# Patient Record
Sex: Male | Born: 1950 | Race: White | Hispanic: No | Marital: Married | Smoking: Current every day smoker
Health system: Southern US, Community
[De-identification: ages and names within clinical notes are randomized; demographics above are authoritative.]

## PROBLEM LIST (undated history)

## (undated) DIAGNOSIS — G35 Multiple sclerosis: Secondary | ICD-10-CM

## (undated) DIAGNOSIS — G709 Myoneural disorder, unspecified: Secondary | ICD-10-CM

## (undated) DIAGNOSIS — F419 Anxiety disorder, unspecified: Secondary | ICD-10-CM

## (undated) DIAGNOSIS — D751 Secondary polycythemia: Secondary | ICD-10-CM

## (undated) DIAGNOSIS — H9190 Unspecified hearing loss, unspecified ear: Secondary | ICD-10-CM

## (undated) DIAGNOSIS — G259 Extrapyramidal and movement disorder, unspecified: Secondary | ICD-10-CM

## (undated) DIAGNOSIS — H539 Unspecified visual disturbance: Secondary | ICD-10-CM

## (undated) HISTORY — DX: Myoneural disorder, unspecified: G70.9

## (undated) HISTORY — DX: Unspecified hearing loss, unspecified ear: H91.90

## (undated) HISTORY — DX: Multiple sclerosis: G35

## (undated) HISTORY — DX: Unspecified visual disturbance: H53.9

## (undated) HISTORY — DX: Extrapyramidal and movement disorder, unspecified: G25.9

## (undated) HISTORY — DX: Anxiety disorder, unspecified: F41.9

## (undated) HISTORY — PX: BUNIONECTOMY: SHX129

## (undated) HISTORY — DX: Secondary polycythemia: D75.1

---

## 2008-04-26 ENCOUNTER — Encounter: Admission: RE | Admit: 2008-04-26 | Discharge: 2008-04-26 | Payer: Self-pay | Admitting: Family Medicine

## 2009-04-01 ENCOUNTER — Ambulatory Visit: Payer: Self-pay | Admitting: Oncology

## 2009-04-05 LAB — CBC WITH DIFFERENTIAL/PLATELET
BASO%: 0.3 % (ref 0.0–2.0)
Basophils Absolute: 0 10*3/uL (ref 0.0–0.1)
EOS%: 1 % (ref 0.0–7.0)
Eosinophils Absolute: 0.1 10*3/uL (ref 0.0–0.5)
HCT: 48.8 % (ref 38.4–49.9)
MCH: 36.8 pg — ABNORMAL HIGH (ref 27.2–33.4)
NEUT%: 66.5 % (ref 39.0–75.0)
RDW: 12.5 % (ref 11.0–14.6)
WBC: 10.4 10*3/uL — ABNORMAL HIGH (ref 4.0–10.3)

## 2009-04-10 LAB — COMPREHENSIVE METABOLIC PANEL
AST: 111 U/L — ABNORMAL HIGH (ref 0–37)
Alkaline Phosphatase: 67 U/L (ref 39–117)
Chloride: 102 mEq/L (ref 96–112)
Creatinine, Ser: 1.09 mg/dL (ref 0.40–1.50)
Glucose, Bld: 101 mg/dL — ABNORMAL HIGH (ref 70–99)
Total Bilirubin: 0.7 mg/dL (ref 0.3–1.2)

## 2009-04-10 LAB — HEMOCHROMATOSIS DNA-PCR(C282Y,H63D)

## 2009-10-03 ENCOUNTER — Encounter: Admission: RE | Admit: 2009-10-03 | Discharge: 2009-10-03 | Payer: Self-pay | Admitting: Orthopedic Surgery

## 2010-06-24 ENCOUNTER — Emergency Department (HOSPITAL_COMMUNITY)
Admission: EM | Admit: 2010-06-24 | Discharge: 2010-06-24 | Disposition: A | Discharge status: Home / Self Care | Payer: 59 | Attending: Emergency Medicine | Admitting: Emergency Medicine

## 2010-06-24 DIAGNOSIS — W1809XA Striking against other object with subsequent fall, initial encounter: Secondary | ICD-10-CM | POA: Insufficient documentation

## 2010-06-24 DIAGNOSIS — Y92009 Unspecified place in unspecified non-institutional (private) residence as the place of occurrence of the external cause: Secondary | ICD-10-CM | POA: Insufficient documentation

## 2010-06-24 DIAGNOSIS — S0180XA Unspecified open wound of other part of head, initial encounter: Secondary | ICD-10-CM | POA: Insufficient documentation

## 2011-07-22 ENCOUNTER — Ambulatory Visit: Payer: 59 | Admitting: Physical Therapy

## 2011-07-30 ENCOUNTER — Ambulatory Visit: Payer: 59 | Attending: Neurology | Admitting: Physical Therapy

## 2011-07-30 DIAGNOSIS — IMO0001 Reserved for inherently not codable concepts without codable children: Secondary | ICD-10-CM | POA: Insufficient documentation

## 2011-07-30 DIAGNOSIS — R269 Unspecified abnormalities of gait and mobility: Secondary | ICD-10-CM | POA: Insufficient documentation

## 2011-08-10 ENCOUNTER — Ambulatory Visit: Payer: 59 | Admitting: Physical Therapy

## 2011-08-12 ENCOUNTER — Ambulatory Visit: Payer: 59 | Admitting: Physical Therapy

## 2011-08-17 ENCOUNTER — Ambulatory Visit: Payer: 59 | Admitting: Physical Therapy

## 2011-08-19 ENCOUNTER — Ambulatory Visit: Payer: 59 | Admitting: Physical Therapy

## 2011-08-24 ENCOUNTER — Ambulatory Visit: Payer: 59 | Admitting: Physical Therapy

## 2011-08-26 ENCOUNTER — Ambulatory Visit: Payer: 59 | Admitting: Physical Therapy

## 2013-11-30 ENCOUNTER — Telehealth: Payer: Self-pay | Admitting: Hematology and Oncology

## 2013-11-30 NOTE — Telephone Encounter (Signed)
S/W PATIENT AND GAVE NP APPT FOR 10/16 @ 11:15 W/DR. [facility]

## 2013-12-08 ENCOUNTER — Ambulatory Visit: Payer: PRIVATE HEALTH INSURANCE

## 2013-12-08 ENCOUNTER — Telehealth: Payer: Self-pay | Admitting: Hematology and Oncology

## 2013-12-08 ENCOUNTER — Encounter: Payer: Self-pay | Admitting: Hematology and Oncology

## 2013-12-08 ENCOUNTER — Ambulatory Visit (HOSPITAL_BASED_OUTPATIENT_CLINIC_OR_DEPARTMENT_OTHER): Payer: PRIVATE HEALTH INSURANCE | Admitting: Hematology and Oncology

## 2013-12-08 VITALS — BP 147/86 | HR 52 | Temp 98.6°F | Resp 18 | Ht 70.0 in | Wt 199.6 lb

## 2013-12-08 DIAGNOSIS — F17200 Nicotine dependence, unspecified, uncomplicated: Secondary | ICD-10-CM

## 2013-12-08 DIAGNOSIS — Z720 Tobacco use: Secondary | ICD-10-CM

## 2013-12-08 DIAGNOSIS — D751 Secondary polycythemia: Secondary | ICD-10-CM

## 2013-12-08 HISTORY — DX: Secondary polycythemia: D75.1

## 2013-12-08 NOTE — Telephone Encounter (Signed)
Gave avs & appt. d/t Oct -Dec.

## 2013-12-08 NOTE — Progress Notes (Signed)
Checked in new patient with no financial issues--[place]..he has  My card and will call if any asst needed. He has appt card and has not been out of the country.

## 2013-12-09 DIAGNOSIS — F17200 Nicotine dependence, unspecified, uncomplicated: Secondary | ICD-10-CM | POA: Insufficient documentation

## 2013-12-09 NOTE — Progress Notes (Signed)
[facility] NOTE  Patient Care Team: Heath Lark, MD as Consulting Physician (Hematology and Oncology) Melinda Crutch, MD as Consulting Physician (Family Medicine)  CHIEF COMPLAINTS/PURPOSE OF CONSULTATION:  Erythrocytosis  HISTORY OF PRESENTING ILLNESS:  Vincent Evans 63 y.o. male is here because of elevated hemoglobin.  He was found to have abnormal CBC from routine blood work. His hemoglobin was found to be as high as 19. I review his records. He was seen by hematologist elsewhere and multiple blood tests were ordered. Bone marrow biopsy was recommended but the patient declined. He denies intermittent headaches, shortness of breath on exertion, frequent leg cramps and occasional chest pain.  He never suffer from diagnosis of blood clot.  There is no prior diagnosis of obstructive sleep apnea. The patient denies weight loss or skin itching.  The patient is a smoker and currently smokes >1 pack of cigarettes per day for the last 40+ years.  MEDICAL HISTORY:  Past Medical History  Diagnosis Date  . Neuromuscular disorder     MS  . Multiple sclerosis   . 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 Bunionectomy      SOCIAL HISTORY: History   Social History  . Marital Status: Married    Spouse Name: N/A    Number of Children: N/A  . Years of Education: N/A   Occupational History  . Not on file.   Social History Main Topics  . Smoking status: Current Every Day Smoker -- 1.00 packs/day for 50 years    Types: Cigarettes  . Smokeless tobacco: Never Used  . Alcohol Use: Yes     Comment: every day  . Drug Use: No  . Sexual Activity: Not on file   Other Topics Concern  . Not on file   Social History Narrative  . No narrative on file    FAMILY HISTORY: Family History  Problem Relation Age of Onset  . Cancer Maternal Grandmother     pancreatic ca    ALLERGIES:  has no allergies on  file.  MEDICATIONS:  Current Outpatient Prescriptions  Medication Sig Dispense Refill  . baclofen (LIORESAL) 10 MG tablet Take 10 mg by mouth as needed for muscle spasms (pt take 1-4 per day for muscle relaxant).      . folic acid (FOLVITE) 559 MCG tablet Take 800 mcg by mouth daily. Indicated for macrocytosis per pt      . NON FORMULARY Take 240 mg by mouth 2 (two) times daily. Tecfidera (for MS)      . NON FORMULARY Take 10 mg by mouth 2 (two) times daily. Ampera (for MS)      . oxybutynin (DITROPAN) 5 MG tablet Take 5 mg by mouth 2 (two) times daily.      Marland Kitchen oxycodone (OXY-IR) 5 MG capsule Take 5 mg by mouth every 6 (six) hours as needed for pain.      . vitamin B-12 (CYANOCOBALAMIN) 1000 MCG tablet Take 5,000 mcg by mouth daily. Indicated for macrocytosis per pt      . Vitamin D, Ergocalciferol, (DRISDOL) 50000 UNITS CAPS capsule Take 5,000 Units by mouth daily. Indicated for MS supplement       No current facility-administered medications for this visit.    REVIEW OF SYSTEMS:   Constitutional: Denies fevers, chills or abnormal night sweats Eyes: Denies blurriness of vision, double vision or watery eyes Ears, nose, mouth, throat, and face: Denies mucositis or sore  throat Respiratory: Denies cough, dyspnea or wheezes Cardiovascular: Denies palpitation, chest discomfort or lower extremity swelling Gastrointestinal:  Denies nausea, heartburn or change in bowel habits. He has chronic bowel incontinence Skin: Denies abnormal skin rashes Lymphatics: Denies new lymphadenopathy or easy bruising Neurological: She has chronic neurological weakness from multiple sclerosis Behavioral/Psych: Mood is stable, no new changes  All other systems were reviewed with the patient and are negative.  PHYSICAL EXAMINATION: ECOG PERFORMANCE STATUS: 2 - Symptomatic, <50% confined to bed  Filed Vitals:   12/08/13 1122  BP: 147/86  Pulse: 52  Temp: 98.6 F (37 C)  Resp: 18   Filed Weights   12/08/13  1122  Weight: 199 lb 9.6 oz (90.538 kg)    GENERAL:alert, no distress and comfortable. He is wheelchair-bound SKIN: skin color is plethoric, texture, turgor are normal, no rashes or significant lesions EYES: normal, conjunctiva are pink and non-injected, sclera clear OROPHARYNX:no exudate, no erythema and lips, buccal mucosa, and tongue normal  NECK: supple, thyroid normal size, non-tender, without nodularity LYMPH:  no palpable lymphadenopathy in the cervical, axillary or inguinal LUNGS: clear to auscultation and percussion with normal breathing effort HEART: regular rate & rhythm and no murmurs and no lower extremity edema ABDOMEN:abdomen soft, non-tender and normal bowel sounds Musculoskeletal:no cyanosis of digits and no clubbing  PSYCH: alert & oriented x 3 with fluent speech  LABORATORY DATA:  I have reviewed the data as listed  ASSESSMENT & PLAN Secondary erythrocytosis He has negative JAK 2 and CAL-R mutation studies performed elsewhere. Serum erythropoietin was not done. With his background history of chronic smoking, it is likely he may have some emphysema. The patient is in danger of blood clots such as heart disease or stroke. I recommend him to start aspirin therapy. I recommend phlebotomy regularly to achieve a hematocrit of less than 48% and hemoglobin under 16. Recommend we start phlebotomy as soon as possible. I recommend deferring bone marrow biopsy to the future only if the erythrocytosis does not improve. The patient is advised to quit smoking immediately.  Smoking I spent some time counseling the patient the importance of tobacco cessation. he is currently attempting to quit on his own  I gave him patient education handout and encouraged him to sign up for smoking cessation class.

## 2013-12-09 NOTE — Assessment & Plan Note (Signed)
I spent some time counseling the patient the importance of tobacco cessation. he is currently attempting to quit on his own  I gave him patient education handout and encouraged him to sign up for smoking cessation class.  

## 2013-12-09 NOTE — Assessment & Plan Note (Signed)
He has negative JAK 2 and CAL-R mutation studies performed elsewhere. Serum erythropoietin was not done. With his background history of chronic smoking, it is likely he may have some emphysema. The patient is in danger of blood clots such as heart disease or stroke. I recommend him to start aspirin therapy. I recommend phlebotomy regularly to achieve a hematocrit of less than 48% and hemoglobin under 16. Recommend we start phlebotomy as soon as possible. I recommend deferring bone marrow biopsy to the future only if the erythrocytosis does not improve. The patient is advised to quit smoking immediately.

## 2013-12-11 ENCOUNTER — Other Ambulatory Visit (HOSPITAL_BASED_OUTPATIENT_CLINIC_OR_DEPARTMENT_OTHER): Payer: PRIVATE HEALTH INSURANCE

## 2013-12-11 ENCOUNTER — Ambulatory Visit (HOSPITAL_BASED_OUTPATIENT_CLINIC_OR_DEPARTMENT_OTHER): Payer: PRIVATE HEALTH INSURANCE

## 2013-12-11 VITALS — BP 128/78 | HR 65 | Temp 98.6°F | Resp 20

## 2013-12-11 DIAGNOSIS — D751 Secondary polycythemia: Secondary | ICD-10-CM

## 2013-12-11 LAB — CBC WITH DIFFERENTIAL/PLATELET
BASO%: 0.4 % (ref 0.0–2.0)
BASOS ABS: 0 10*3/uL (ref 0.0–0.1)
EOS%: 0.9 % (ref 0.0–7.0)
Eosinophils Absolute: 0.1 10*3/uL (ref 0.0–0.5)
HCT: 59.3 % — ABNORMAL HIGH (ref 38.4–49.9)
HEMOGLOBIN: 19.5 g/dL — AB (ref 13.0–17.1)
LYMPH%: 10.3 % — AB (ref 14.0–49.0)
MCH: 36.2 pg — AB (ref 27.2–33.4)
MCHC: 32.9 g/dL (ref 32.0–36.0)
MCV: 109.8 fL — AB (ref 79.3–98.0)
MONO#: 0.8 10*3/uL (ref 0.1–0.9)
MONO%: 8.2 % (ref 0.0–14.0)
NEUT#: 8 10*3/uL — ABNORMAL HIGH (ref 1.5–6.5)
NEUT%: 80.2 % — ABNORMAL HIGH (ref 39.0–75.0)
Platelets: 154 10*3/uL (ref 140–400)
RBC: 5.4 10*6/uL (ref 4.20–5.82)
RDW: 12.7 % (ref 11.0–14.6)
WBC: 10 10*3/uL (ref 4.0–10.3)
lymph#: 1 10*3/uL (ref 0.9–3.3)

## 2013-12-11 LAB — FERRITIN CHCC: Ferritin: 601 ng/ml — ABNORMAL HIGH (ref 22–316)

## 2013-12-11 NOTE — Progress Notes (Signed)
Pt tolerated procedure well. Able to eat  Snack after procedure. Rt arm Phlebotomy site CDI. D/C ed home

## 2013-12-11 NOTE — Patient Instructions (Signed)

## 2013-12-22 ENCOUNTER — Other Ambulatory Visit (HOSPITAL_BASED_OUTPATIENT_CLINIC_OR_DEPARTMENT_OTHER): Payer: PRIVATE HEALTH INSURANCE

## 2013-12-22 ENCOUNTER — Other Ambulatory Visit: Payer: Self-pay | Admitting: Hematology and Oncology

## 2013-12-22 ENCOUNTER — Ambulatory Visit (HOSPITAL_BASED_OUTPATIENT_CLINIC_OR_DEPARTMENT_OTHER): Payer: PRIVATE HEALTH INSURANCE

## 2013-12-22 ENCOUNTER — Encounter: Payer: Self-pay | Admitting: Hematology and Oncology

## 2013-12-22 VITALS — BP 140/80 | HR 49 | Temp 98.4°F | Resp 20

## 2013-12-22 DIAGNOSIS — F419 Anxiety disorder, unspecified: Secondary | ICD-10-CM

## 2013-12-22 DIAGNOSIS — D751 Secondary polycythemia: Secondary | ICD-10-CM

## 2013-12-22 HISTORY — DX: Anxiety disorder, unspecified: F41.9

## 2013-12-22 LAB — CBC WITH DIFFERENTIAL/PLATELET
BASO%: 3.4 % — ABNORMAL HIGH (ref 0.0–2.0)
BASOS ABS: 0.4 10*3/uL — AB (ref 0.0–0.1)
EOS%: 1 % (ref 0.0–7.0)
Eosinophils Absolute: 0.1 10*3/uL (ref 0.0–0.5)
HEMATOCRIT: 57.3 % — AB (ref 38.4–49.9)
HEMOGLOBIN: 19.3 g/dL — AB (ref 13.0–17.1)
LYMPH%: 6.5 % — AB (ref 14.0–49.0)
MCH: 36.6 pg — ABNORMAL HIGH (ref 27.2–33.4)
MCHC: 33.7 g/dL (ref 32.0–36.0)
MCV: 108.6 fL — AB (ref 79.3–98.0)
MONO#: 0.7 10*3/uL (ref 0.1–0.9)
MONO%: 6.3 % (ref 0.0–14.0)
NEUT%: 82.8 % — ABNORMAL HIGH (ref 39.0–75.0)
NEUTROS ABS: 9.1 10*3/uL — AB (ref 1.5–6.5)
PLATELETS: 185 10*3/uL (ref 140–400)
RBC: 5.27 10*6/uL (ref 4.20–5.82)
RDW: 12.5 % (ref 11.0–14.6)
WBC: 11 10*3/uL — AB (ref 4.0–10.3)
lymph#: 0.7 10*3/uL — ABNORMAL LOW (ref 0.9–3.3)

## 2013-12-22 LAB — FERRITIN CHCC: FERRITIN: 444 ng/mL — AB (ref 22–316)

## 2013-12-22 MED ORDER — LORAZEPAM 1 MG PO TABS: 1.0000 mg | ORAL_TABLET | Freq: Every day | ORAL | Status: DC | PRN

## 2013-12-22 NOTE — Progress Notes (Signed)
500 grams removed using a 16 gauge phlebotomy set over 8 minutes. Patient tolerated well. Nourishment provided.

## 2013-12-22 NOTE — Patient Instructions (Signed)

## 2014-01-01 ENCOUNTER — Telehealth: Payer: Self-pay | Admitting: *Deleted

## 2014-01-01 NOTE — Telephone Encounter (Signed)
Daughter states pt wants to request Mendel Ryder, RN to perform his phlebotomy this Friday.  Informed her I will make the request if she is available.  She verbalized understanding.  States pt very anxious and it would help his anxiety level to have RN he trusts as she did his first phlebotomy and he felt it went well.

## 2014-01-04 ENCOUNTER — Other Ambulatory Visit: Payer: Self-pay | Admitting: *Deleted

## 2014-01-05 ENCOUNTER — Other Ambulatory Visit (HOSPITAL_BASED_OUTPATIENT_CLINIC_OR_DEPARTMENT_OTHER): Payer: PRIVATE HEALTH INSURANCE

## 2014-01-05 ENCOUNTER — Ambulatory Visit (HOSPITAL_BASED_OUTPATIENT_CLINIC_OR_DEPARTMENT_OTHER): Payer: PRIVATE HEALTH INSURANCE

## 2014-01-05 DIAGNOSIS — D751 Secondary polycythemia: Secondary | ICD-10-CM

## 2014-01-05 LAB — CBC WITH DIFFERENTIAL/PLATELET
BASO%: 0.7 % (ref 0.0–2.0)
Basophils Absolute: 0.1 10*3/uL (ref 0.0–0.1)
EOS%: 1 % (ref 0.0–7.0)
Eosinophils Absolute: 0.1 10*3/uL (ref 0.0–0.5)
HCT: 56.6 % — ABNORMAL HIGH (ref 38.4–49.9)
HGB: 19.3 g/dL — ABNORMAL HIGH (ref 13.0–17.1)
LYMPH#: 1 10*3/uL (ref 0.9–3.3)
LYMPH%: 10.1 % — AB (ref 14.0–49.0)
MCH: 36.6 pg — AB (ref 27.2–33.4)
MCHC: 34 g/dL (ref 32.0–36.0)
MCV: 107.6 fL — ABNORMAL HIGH (ref 79.3–98.0)
MONO#: 0.9 10*3/uL (ref 0.1–0.9)
MONO%: 9.4 % (ref 0.0–14.0)
NEUT%: 78.8 % — AB (ref 39.0–75.0)
NEUTROS ABS: 7.7 10*3/uL — AB (ref 1.5–6.5)
PLATELETS: 157 10*3/uL (ref 140–400)
RBC: 5.26 10*6/uL (ref 4.20–5.82)
RDW: 13.1 % (ref 11.0–14.6)
WBC: 9.7 10*3/uL (ref 4.0–10.3)

## 2014-01-05 LAB — FERRITIN CHCC: FERRITIN: 258 ng/mL (ref 22–316)

## 2014-01-05 NOTE — Patient Instructions (Signed)

## 2014-01-05 NOTE — Progress Notes (Signed)
Phlebotomy performed via right AC, 500cc removed over approximately 8 minutes.  Pt tolerated well, snack/drink provided.  Pt observed 30 minutes post procedure.

## 2014-01-19 ENCOUNTER — Ambulatory Visit (HOSPITAL_BASED_OUTPATIENT_CLINIC_OR_DEPARTMENT_OTHER): Payer: PRIVATE HEALTH INSURANCE

## 2014-01-19 ENCOUNTER — Other Ambulatory Visit (HOSPITAL_BASED_OUTPATIENT_CLINIC_OR_DEPARTMENT_OTHER): Payer: PRIVATE HEALTH INSURANCE

## 2014-01-19 DIAGNOSIS — D751 Secondary polycythemia: Secondary | ICD-10-CM

## 2014-01-19 LAB — CBC WITH DIFFERENTIAL/PLATELET
BASO%: 0.3 % (ref 0.0–2.0)
Basophils Absolute: 0 10*3/uL (ref 0.0–0.1)
EOS ABS: 0.1 10*3/uL (ref 0.0–0.5)
EOS%: 1.3 % (ref 0.0–7.0)
HEMATOCRIT: 54.7 % — AB (ref 38.4–49.9)
HEMOGLOBIN: 19.6 g/dL — AB (ref 13.0–17.1)
LYMPH#: 1 10*3/uL (ref 0.9–3.3)
LYMPH%: 10 % — ABNORMAL LOW (ref 14.0–49.0)
MCH: 37.7 pg — ABNORMAL HIGH (ref 27.2–33.4)
MCHC: 35.8 g/dL (ref 32.0–36.0)
MCV: 105.2 fL — AB (ref 79.3–98.0)
MONO#: 0.9 10*3/uL (ref 0.1–0.9)
MONO%: 9.2 % (ref 0.0–14.0)
NEUT#: 7.7 10*3/uL — ABNORMAL HIGH (ref 1.5–6.5)
NEUT%: 79.2 % — ABNORMAL HIGH (ref 39.0–75.0)
NRBC: 0 % (ref 0–0)
PLATELETS: 67 10*3/uL — AB (ref 140–400)
RBC: 5.2 10*6/uL (ref 4.20–5.82)
RDW: 12.6 % (ref 11.0–14.6)
WBC: 9.7 10*3/uL (ref 4.0–10.3)

## 2014-01-19 NOTE — Progress Notes (Signed)
1430- Patient discharged via wheelchair, no acute distress. VSS. Observed 30 mins post phlebotomy. Right AC dressing clean/dry/intact.

## 2014-01-19 NOTE — Patient Instructions (Signed)

## 2014-01-19 NOTE — Progress Notes (Signed)
Spoke with Kennyth Lose in the lab, states they have to look at pt's slide to result CBC but states Hgb is 19.6 and HCT: 54.7.  Okay to proceed with phlebotomy today per MD note.  1400: 500 grams removed through therapeutic phlebotomy per order from right [facility] without difficulty using phlebotomy set over 8 minutes.  Pt tolerated procedure well, snacks provided. Will monitor for 30 minutes post phlebotomy.

## 2014-01-22 LAB — FERRITIN CHCC: FERRITIN: 183 ng/mL (ref 22–316)

## 2014-02-02 ENCOUNTER — Other Ambulatory Visit: Payer: Self-pay | Admitting: Hematology and Oncology

## 2014-02-02 ENCOUNTER — Ambulatory Visit (HOSPITAL_BASED_OUTPATIENT_CLINIC_OR_DEPARTMENT_OTHER): Payer: PRIVATE HEALTH INSURANCE

## 2014-02-02 ENCOUNTER — Other Ambulatory Visit (HOSPITAL_BASED_OUTPATIENT_CLINIC_OR_DEPARTMENT_OTHER): Payer: PRIVATE HEALTH INSURANCE

## 2014-02-02 DIAGNOSIS — D751 Secondary polycythemia: Secondary | ICD-10-CM

## 2014-02-02 LAB — CBC WITH DIFFERENTIAL/PLATELET
BASO%: 0.6 % (ref 0.0–2.0)
BASOS ABS: 0.1 10*3/uL (ref 0.0–0.1)
EOS ABS: 0.1 10*3/uL (ref 0.0–0.5)
EOS%: 0.8 % (ref 0.0–7.0)
HCT: 56.1 % — ABNORMAL HIGH (ref 38.4–49.9)
HGB: 18.9 g/dL — ABNORMAL HIGH (ref 13.0–17.1)
LYMPH%: 10.7 % — AB (ref 14.0–49.0)
MCH: 36.1 pg — AB (ref 27.2–33.4)
MCHC: 33.6 g/dL (ref 32.0–36.0)
MCV: 107.2 fL — AB (ref 79.3–98.0)
MONO#: 0.9 10*3/uL (ref 0.1–0.9)
MONO%: 8.7 % (ref 0.0–14.0)
NEUT%: 79.2 % — AB (ref 39.0–75.0)
NEUTROS ABS: 8 10*3/uL — AB (ref 1.5–6.5)
PLATELETS: 164 10*3/uL (ref 140–400)
RBC: 5.23 10*6/uL (ref 4.20–5.82)
RDW: 12.7 % (ref 11.0–14.6)
WBC: 10 10*3/uL (ref 4.0–10.3)
lymph#: 1.1 10*3/uL (ref 0.9–3.3)

## 2014-02-02 LAB — FERRITIN CHCC: Ferritin: 85 ng/ml (ref 22–316)

## 2014-02-02 NOTE — Patient Instructions (Signed)

## 2014-02-02 NOTE — Progress Notes (Signed)
Phlebotomy completed with #16 g needle via Left  ACF. 13 minutes required for 500 g removal. Tolerated well. Given beverage after. Rested x 30 minutes for observation. VSS before and after, though BP a little low s/p phiebotomy. Denies dizzyness or lightheadedness.  Transport via w/c.

## 2014-02-05 ENCOUNTER — Other Ambulatory Visit: Payer: Self-pay | Admitting: Hematology and Oncology

## 2014-02-05 ENCOUNTER — Telehealth: Payer: Self-pay | Admitting: Hematology and Oncology

## 2014-02-05 ENCOUNTER — Other Ambulatory Visit: Payer: Self-pay | Admitting: *Deleted

## 2014-02-05 NOTE — Telephone Encounter (Signed)
I spoke with the patient over the telephone. The patient has cancelled future phlebotomy sessions because he is feeling tired, along with difficulties traveling with his neurological condition. I reinforced the importance of phlebotomy sessions to reduce the risk of stroke and heart attack. We are making progress by reducing his ferritin level down and his hemoglobin is improving. The patient will call back if he is interested to return. I will Route this message to his primary care provider.

## 2014-02-05 NOTE — Telephone Encounter (Signed)
I spoke with the patient again. He has changes mind and wants to resume phlebotomy monthly starting an of January. He agreed to come in once a month for the procedure for target hemoglobin less than 16 g.

## 2014-02-06 ENCOUNTER — Telehealth: Payer: Self-pay | Admitting: Hematology and Oncology

## 2014-02-06 NOTE — Telephone Encounter (Signed)
, °

## 2014-03-13 ENCOUNTER — Encounter: Payer: Self-pay | Admitting: Neurology

## 2014-03-13 ENCOUNTER — Encounter: Payer: Self-pay | Admitting: *Deleted

## 2014-03-13 ENCOUNTER — Ambulatory Visit (INDEPENDENT_AMBULATORY_CARE_PROVIDER_SITE_OTHER): Payer: 59 | Admitting: Neurology

## 2014-03-13 VITALS — BP 152/96 | HR 68 | Resp 16 | Ht 69.5 in | Wt 191.0 lb

## 2014-03-13 DIAGNOSIS — R261 Paralytic gait: Secondary | ICD-10-CM | POA: Diagnosis not present

## 2014-03-13 DIAGNOSIS — D751 Secondary polycythemia: Secondary | ICD-10-CM

## 2014-03-13 DIAGNOSIS — G35 Multiple sclerosis: Secondary | ICD-10-CM

## 2014-03-13 DIAGNOSIS — F17200 Nicotine dependence, unspecified, uncomplicated: Secondary | ICD-10-CM

## 2014-03-13 DIAGNOSIS — Z720 Tobacco use: Secondary | ICD-10-CM | POA: Diagnosis not present

## 2014-03-13 DIAGNOSIS — Z716 Tobacco abuse counseling: Secondary | ICD-10-CM | POA: Insufficient documentation

## 2014-03-13 DIAGNOSIS — R3915 Urgency of urination: Secondary | ICD-10-CM | POA: Insufficient documentation

## 2014-03-13 DIAGNOSIS — F419 Anxiety disorder, unspecified: Secondary | ICD-10-CM

## 2014-03-13 MED ORDER — OXYCODONE HCL 5 MG PO TABS: 5.0000 mg | ORAL_TABLET | Freq: Four times a day (QID) | ORAL | Status: DC | PRN

## 2014-03-13 NOTE — Telephone Encounter (Signed)
Created in error

## 2014-03-13 NOTE — Progress Notes (Signed)
[facility]  PATIENT: Vincent Evans DOB: October 28, 1950  REFERRING CLINICIAN: Melinda Crutch HISTORY FROM: Patient  REASON FOR VISIT: Multiple Sclerosis   HISTORICAL  CHIEF COMPLAINT:  Chief Complaint  Patient presents with  . Multiple Sclerosis    Sts. gait, balance are intermitently worse over the last sev. mos.  New c/o right lower back pain, right leg pain, worse at night, onset about one month ago, without known injury./fim    HISTORY OF PRESENT ILLNESS:  Vincent Evans is a 64 year old man who was diagnosed with multiple sclerosis after presenting with left leg weakness and gait spasticity and clumsiness. He had MRIs of the brain and spine. Findings were consistent with multiple sclerosis. He was referred to me and I placed him on Tecfidera. Also, because of his gait disorder he was started on Ampyra to help improve that. He tolerates Ampyra well but he is uncertain how much benefit he gets from the medication. We discussed a trial off the medicine to see if he notices a difference or not. If he does not note a difference then he should discontinue the medicine..   Initially, he had GI symptoms and diarrhea from Tecfidera but this is much better now.    He gets frequent CBzc's due to his hematologic disorder and lymphocyte count is usually about 1.0  --- last test 11/2013.    His main problem with MS is poor gait. He has moderate weakness in the left leg and mild weakness in the right leg. He uses a cane when he walks with some benefit. He is very careful when he walks and has not fallen. He has a left foot drop. He does not note any numbness in the legs. However, at times, he will get pain that shoots from his back down the right leg. He does not have pain in the more affected left leg. His legs are spastic and he sometimes has clonus if he sits in a certain position.   His pain is helped by oxycodone -- takes 1 to 3 pills daily.   He notes his pain in the legs is worse when he lays  down at night, especially down the right leg. He notes more spasticity at night also.   He takes baclofen twice a day most days but is written for up to 4. He thinks he gets some benefit from the baclofen. He has not tried 4 pills a day on a regular basis. He does not have side effects with the baclofen.Marland Kitchen  He notes bladder urgency and frequency and has had some incontinence. He is on oxybutynin 5 mg by mouth twice a day and he feels he gets some benefit from the medications, but not complete benefit.  He has 2 x nocturia most nights.    He notes vision is usually fine but he does have some difficulty with reading at times. He denies double vision.  He notes fatigue daily basis and feels that this has become more of a problem.  His fatigue is more mental than physical. He has had some depression over the past 2 years. At times he will have some tearfulness. He feels more apathetic. He sleeps fairly well most nights.  He feels his cognition is doing well and he is actually taking a course of stock options now.   He does activities on the computer.      REVIEW OF SYSTEMS:  Constitutional: No fevers, chills, sweats, or change in appetite Eyes: He has notes some visual  blurring.   He denies double vision, eye pain Ear, nose and throat: No hearing loss, ear pain, nasal congestion, sore throat Cardiovascular: No chest pain, palpitations Respiratory:  He notes cough but no SOB GastrointestinaI: No nausea, vomiting, diarrhea, abdominal pain, fecal incontinence Genitourinary:  as above Musculoskeletal:  No neck pain, back pain Integumentary: No rash, pruritus, skin lesions Neurological: as above.  And has noted RLS Psychiatric: He notes mild depression at this time.  No anxiety Endocrine: No palpitations, diaphoresis, change in appetite, change in weigh or increased thirst Hematologic/Lymphatic:  No anemia, purpura, petechiae. Allergic/Immunologic: No itchy/runny eyes, nasal congestion, recent allergic  reactions, rashes  ALLERGIES: No Known Allergies  HOME MEDICATIONS: Outpatient Prescriptions Prior to Visit  Medication Sig Dispense Refill  . baclofen (LIORESAL) 10 MG tablet Take 10 mg by mouth as needed for muscle spasms (pt take 1-4 per day for muscle relaxant).    . folic acid (FOLVITE) 952 MCG tablet Take 800 mcg by mouth daily. Indicated for macrocytosis per pt    . LORazepam (ATIVAN) 1 MG tablet Take 1 tablet (1 mg total) by mouth daily as needed for anxiety (before phlebotomy). 10 tablet 0  . NON FORMULARY Take 10 mg by mouth 2 (two) times daily. Ampera (for MS)    . oxybutynin (DITROPAN) 5 MG tablet Take 5 mg by mouth 2 (two) times daily.    . vitamin B-12 (CYANOCOBALAMIN) 1000 MCG tablet Take 5,000 mcg by mouth daily. Indicated for macrocytosis per pt    . Vitamin D, Ergocalciferol, (DRISDOL) 50000 UNITS CAPS capsule Take 5,000 Units by mouth daily. Indicated for MS supplement    . NON FORMULARY Take 240 mg by mouth 2 (two) times daily. Tecfidera (for MS)    . oxycodone (OXY-IR) 5 MG capsule Take 5 mg by mouth every 6 (six) hours as needed for pain.     No facility-administered medications prior to visit.    PAST MEDICAL HISTORY: Past Medical History  Diagnosis Date  . Neuromuscular disorder     MS  . Multiple sclerosis   . 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 Anxiety 12/22/2013  . Hearing loss   . Movement disorder   . Vision abnormalities     PAST SURGICAL HISTORY: Past Surgical History  Procedure Laterality Date  . Bunionectomy      FAMILY HISTORY: Family History  Problem Relation Age of Onset  . Cancer Maternal Grandmother     pancreatic ca  . Dementia Mother   . Diabetes type II Father   . Hypertension Father     SOCIAL HISTORY:  History   Social History  . Marital Status: Married    Spouse Name: N/A    Number of Children: N/A  . Years of Education: N/A   Occupational History  . Not on file.   Social History Main Topics  . Smoking  status: Current Every Day Smoker -- 1.00 packs/day for 35 years    Types: Cigarettes  . Smokeless tobacco: Never Used  . Alcohol Use: 0.0 oz/week    0 Not specified per week     Comment: 3 to 6 oz. of Bourbon daily, and an occasional beer./fim  . Drug Use: No  . Sexual Activity: Not on file   Other Topics Concern  . Not on file   Social History Narrative     PHYSICAL EXAM  Filed Vitals:   03/13/14 1623  BP: 152/96  Pulse: 68  Resp: 16  Height: 5' 9.5" (1.765 m)  Weight:  191 lb (86.637 kg)    Body mass index is 27.81 kg/(m^2).   General: The patient is well-developed and well-nourished and in no acute distress  Eyes:  Funduscopic exam shows normal optic discs and retinal vessels.  Neck: The neck is supple, no carotid bruits are noted.  The neck is nontender.  Respiratory: The respiratory examination is clear.  Cardiovascular: The cardiovascular examination reveals a regular rate and rhythm, no murmurs, gallops or rubs are noted.  Skin: Extremities are without significant edema.  Neurologic Exam  Mental status: The patient is alert and oriented x 3 at the time of the examination. The patient has apparent normal recent and remote memory, with an apparently normal attention span and concentration ability.   Speech is normal.  Cranial nerves: Extraocular movements are full. Pupils are equal, round, and reactive to light and accomodation.  Visual fields are full.  Facial symmetry is present. There is good facial sensation to soft touch bilaterally.Facial strength is normal.  Trapezius and sternocleidomastoid strength is normal. No dysarthria is noted.  The tongue is midline, and the patient has symmetric elevation of the soft palate. No obvious hearing deficits are noted.  Motor:  Muscle bulk is normal and toine is increased in legs, left > right. Strength is  5 / 5 in arms but 4+/5 in right hip flexors and 4-/5 in left hip flexors and 4/5 elsewhere in left leg.      Sensory: Sensory testing is intact to pinprick, soft touch, vibration sensation, and position sense on all 4 extremities.  Coordination: Cerebellar testing reveals good finger-nose-finger and heel-to-shin bilaterally.  Gait and station: Station is stable but gait shows left foot drop and some spasticity.   He can not tandem. Romberg is negative.   Reflexes: Deep tendon reflexes are increase in legs, left more than rightand normal . Plantar responses are normal.    DIAGNOSTIC DATA (LABS, IMAGING, TESTING) - I reviewed patient records, labs, notes, testing and imaging myself where available.  Lab Results  Component Value Date   WBC 10.0 02/02/2014   HGB 18.9* 02/02/2014   HCT 56.1* 02/02/2014   MCV 107.2* 02/02/2014   PLT 164 02/02/2014      Component Value Date/Time   NA 138 04/05/2009 1040   K 4.7 04/05/2009 1040   CL 102 04/05/2009 1040   CO2 25 04/05/2009 1040   GLUCOSE 101* 04/05/2009 1040   BUN 11 04/05/2009 1040   CREATININE 1.09 04/05/2009 1040   CALCIUM 9.7 04/05/2009 1040   PROT 7.6 04/05/2009 1040   ALBUMIN 4.7 04/05/2009 1040   AST 111* 04/05/2009 1040   ALT 97* 04/05/2009 1040   ALKPHOS 67 04/05/2009 1040   BILITOT 0.7 04/05/2009 1040       ASSESSMENT AND PLAN  Multiple sclerosis - Plan: MR Brain W Wo Contrast  Spastic gait - Plan: MR Brain W Wo Contrast  Anxiety  Secondary erythrocytosis  Smoking  Tobacco abuse counseling  Bladder urgency  In summary, Vincent Evans is a 64 year old man with multiple sclerosis who has gait dysfunction as his main impairment. He is advised to continue to use his cane and to try to stay as active as possible.   He appears to be doing well on Tecfidera and we will check an MRI of the brain with and without contrast to rule out subclinical progression of the disease. If he does have MRI evidence of progression, we would need to consider a different medication. We had discussed Tysabri in the  past. He Is Not certain  if he is getting a benefit from [place] are not so we will go off the medicines for a couple weeks he notices a difference.  He will go back on the medicine if he notices a difference while off.   We had a 5 minute discussion about his tobacco abuse and that studies have shown that smoking can lead to worsening exacerbations in multiple sclerosis. He understands but at this time is not prepared to quit smoking.  He will return to see me in 3 or 4 months or call sooner if he has new or worsening neurologic symptoms.  Richard A. Felecia Shelling, MD, PhD 09/25/2120, 4:82 PM Certified in Neurology, Clinical Neurophysiology, Sleep Medicine, Pain Medicine and Neuroimaging  [facility] [address], [place], [place] [address] [phone]

## 2014-03-23 ENCOUNTER — Other Ambulatory Visit (HOSPITAL_BASED_OUTPATIENT_CLINIC_OR_DEPARTMENT_OTHER): Payer: PRIVATE HEALTH INSURANCE

## 2014-03-23 ENCOUNTER — Ambulatory Visit (HOSPITAL_BASED_OUTPATIENT_CLINIC_OR_DEPARTMENT_OTHER): Payer: PRIVATE HEALTH INSURANCE

## 2014-03-23 DIAGNOSIS — D751 Secondary polycythemia: Secondary | ICD-10-CM

## 2014-03-23 LAB — CBC WITH DIFFERENTIAL/PLATELET
BASO%: 0.3 % (ref 0.0–2.0)
BASOS ABS: 0 10*3/uL (ref 0.0–0.1)
EOS%: 0.8 % (ref 0.0–7.0)
Eosinophils Absolute: 0.1 10*3/uL (ref 0.0–0.5)
HCT: 52.6 % — ABNORMAL HIGH (ref 38.4–49.9)
HEMOGLOBIN: 18.1 g/dL — AB (ref 13.0–17.1)
LYMPH%: 13.1 % — ABNORMAL LOW (ref 14.0–49.0)
MCH: 35.4 pg — ABNORMAL HIGH (ref 27.2–33.4)
MCHC: 34.4 g/dL (ref 32.0–36.0)
MCV: 102.7 fL — ABNORMAL HIGH (ref 79.3–98.0)
MONO#: 0.6 10*3/uL (ref 0.1–0.9)
MONO%: 8.9 % (ref 0.0–14.0)
NEUT#: 5.4 10*3/uL (ref 1.5–6.5)
NEUT%: 76.9 % — AB (ref 39.0–75.0)
PLATELETS: 127 10*3/uL — AB (ref 140–400)
RBC: 5.12 10*6/uL (ref 4.20–5.82)
RDW: 12.1 % (ref 11.0–14.6)
WBC: 7.1 10*3/uL (ref 4.0–10.3)
lymph#: 0.9 10*3/uL (ref 0.9–3.3)

## 2014-03-23 LAB — FERRITIN CHCC: FERRITIN: 45 ng/mL (ref 22–316)

## 2014-03-23 NOTE — Patient Instructions (Signed)
Therapeutic Phlebotomy, Care After Refer to this sheet in the next few weeks. These instructions provide you with information on caring for yourself after your procedure. Your caregiver may also give you more specific instructions. Your treatment has been planned according to current medical practices, but problems sometimes occur. Call your caregiver if you have any problems or questions after your procedure. HOME CARE INSTRUCTIONS Most people can go back to their normal activities right away. Before you leave, be sure to ask if there is anything you should or should not do. In general, it would be wise to:  Keep the bandage dry. You can remove the bandage after about 5 hours.  Eat well-balanced meals for the next 24 hours.  Drink enough fluids to keep your urine clear or pale yellow.  Avoid drinking alcohol minimally until after eating.  Avoid smoking for at least 30 minutes after the procedure.  Avoid strenuous physical activity or heavy lifting or pulling for about 5 hours after the procedure.  Athletes should avoid strenuous exercise for 12 hours or more.  Change positions slowly for the remainder of the day to prevent light-headedness or fainting.  If you feel light-headed, lie down until the feeling subsides.  If you have bleeding from the needle insertion site, elevate your arm and press firmly on the site until the bleeding stops.  If bruising or bleeding appears under the skin, apply ice to the area for 15 to 20 minutes, 3 to 4 times per day. Put the ice in a plastic bag and place a towel between the bag of ice and your skin. Do this while you are awake for the first 24 hours. The ice packs can be stopped before 24 hours if the swelling goes away. If swelling persists after 24 hours, a warm, moist washcloth can be applied to the area for 15 to 20 minutes, 3 to 4 times per day. The warm, moist treatments can be stopped when the swelling goes away.  It is important to continue  further therapeutic phlebotomy as directed by your caregiver. SEEK MEDICAL CARE IF:  There is bleeding or fluid leaking from the needle insertion site.  The needle insertion site becomes swollen, red, or sore.  You feel light-headed, dizzy or nauseated, and the feeling does not go away.  You notice new bruising at the needle insertion site.  You feel more weak or tired than normal.  You develop a fever. SEEK IMMEDIATE MEDICAL CARE IF:   There is increased bleeding, pain, or swelling from the needle insertion site.  You have severe nausea or vomiting.  You have chest pain.  You have trouble breathing. MAKE SURE YOU:  Understand these instructions.  Will watch your condition.  Will get help right away if you are not doing well or get worse. Document Released: 07/14/2010 Document Revised: 06/26/2013 Document Reviewed: 07/14/2010 [facility] Patient Information 2015 [place], [place]. This information is not intended to replace advice given to you by your health care provider. Make sure you discuss any questions you have with your health care provider.   Therapeutic Phlebotomy Therapeutic phlebotomy is the controlled removal of blood from your body for the purpose of treating a medical condition. It is similar to donating blood. Usually, about a pint (470 mL) of blood is removed. The average adult has 9 to 12 pints (4.3 to 5.7 L) of blood. Therapeutic phlebotomy may be used to treat the following medical conditions:  Hemochromatosis. This is a condition in which there is too  much iron in the blood.  Polycythemia vera. This is a condition in which there are too many red cells in the blood.  Porphyria cutanea tarda. This is a disease usually passed from one generation to the next (inherited). It is a condition in which an important part of hemoglobin is not made properly. This results in the build up of abnormal amounts of porphyrins in the body.  Sickle cell disease. This is an  inherited disease. It is a condition in which the red blood cells form an abnormal crescent shape rather than a round shape. LET YOUR CAREGIVER KNOW ABOUT:  Allergies.  Medicines taken including herbs, eyedrops, over-the-counter medicines, and creams.  Use of steroids (by mouth or creams).  Previous problems with anesthetics or numbing medicine.  History of blood clots.  History of bleeding or blood problems.  Previous surgery.  Possibility of pregnancy, if this applies. RISKS AND COMPLICATIONS This is a simple and safe procedure. Problems are unlikely. However, problems can occur and may include:  Nausea or lightheadedness.  Low blood pressure.  Soreness, bleeding, swelling, or bruising at the needle insertion site.  Infection. BEFORE THE PROCEDURE  This is a procedure that can be done as an outpatient. Confirm the time that you need to arrive for your procedure. Confirm whether there is a need to fast or withhold any medications. It is helpful to wear clothing with sleeves that can be raised above the elbow. A blood sample may be done to determine the amount of red blood cells or iron in your blood. Plan ahead of time to have someone drive you home after the procedure. PROCEDURE The entire procedure from preparation through recovery takes about 1 hour. The actual collection takes about 10 to 15 minutes.  A needle will be inserted into your vein.  Tubing and a collection bag will be attached to that needle.  Blood will flow through the needle and tubing into the collection bag.  You may be asked to open and close your hand slowly and continuously during the entire collection.  Once the specified amount of blood has been removed from your body, the collection bag and tubing will be clamped.  The needle will be removed.  Pressure will be held on the site of the needle insertion to stop the bleeding. Then a bandage will be placed over the needle insertion site. AFTER THE  PROCEDURE  Your recovery will be assessed and monitored. If there are no problems, as an outpatient, you should be able to go home shortly after the procedure.  Document Released: 07/14/2010 Document Revised: 05/04/2011 Document Reviewed: 07/14/2010 [facility] Patient Information 2015 [place], [place]. This information is not intended to replace advice given to you by your health care provider. Make sure you discuss any questions you have with your health care provider.

## 2014-03-28 ENCOUNTER — Inpatient Hospital Stay: Admission: RE | Admit: 2014-03-28 | Payer: PRIVATE HEALTH INSURANCE | Source: Ambulatory Visit

## 2014-04-20 ENCOUNTER — Ambulatory Visit (HOSPITAL_BASED_OUTPATIENT_CLINIC_OR_DEPARTMENT_OTHER): Payer: PRIVATE HEALTH INSURANCE

## 2014-04-20 ENCOUNTER — Other Ambulatory Visit (HOSPITAL_BASED_OUTPATIENT_CLINIC_OR_DEPARTMENT_OTHER): Payer: PRIVATE HEALTH INSURANCE

## 2014-04-20 DIAGNOSIS — D751 Secondary polycythemia: Secondary | ICD-10-CM

## 2014-04-20 LAB — CBC WITH DIFFERENTIAL/PLATELET
BASO%: 0.6 % (ref 0.0–2.0)
Basophils Absolute: 0 10*3/uL (ref 0.0–0.1)
EOS ABS: 0.1 10*3/uL (ref 0.0–0.5)
EOS%: 1.1 % (ref 0.0–7.0)
HEMATOCRIT: 54.3 % — AB (ref 38.4–49.9)
HGB: 17.8 g/dL — ABNORMAL HIGH (ref 13.0–17.1)
LYMPH%: 14.3 % (ref 14.0–49.0)
MCH: 33.1 pg (ref 27.2–33.4)
MCHC: 32.7 g/dL (ref 32.0–36.0)
MCV: 101.3 fL — ABNORMAL HIGH (ref 79.3–98.0)
MONO#: 0.6 10*3/uL (ref 0.1–0.9)
MONO%: 8.8 % (ref 0.0–14.0)
NEUT%: 75.2 % — AB (ref 39.0–75.0)
NEUTROS ABS: 5 10*3/uL (ref 1.5–6.5)
PLATELETS: 161 10*3/uL (ref 140–400)
RBC: 5.36 10*6/uL (ref 4.20–5.82)
RDW: 12.9 % (ref 11.0–14.6)
WBC: 6.7 10*3/uL (ref 4.0–10.3)
lymph#: 1 10*3/uL (ref 0.9–3.3)

## 2014-04-20 LAB — FERRITIN CHCC: Ferritin: 41 ng/ml (ref 22–316)

## 2014-04-20 NOTE — Progress Notes (Signed)
Patient discharged home via wheelchair with wife.  Post vital signs stable.  No signs or symptoms of distress noted.

## 2014-04-20 NOTE — Patient Instructions (Signed)
Therapeutic Phlebotomy, Care After Refer to this sheet in the next few weeks. These instructions provide you with information on caring for yourself after your procedure. Your caregiver may also give you more specific instructions. Your treatment has been planned according to current medical practices, but problems sometimes occur. Call your caregiver if you have any problems or questions after your procedure. HOME CARE INSTRUCTIONS Most people can go back to their normal activities right away. Before you leave, be sure to ask if there is anything you should or should not do. In general, it would be wise to:  Keep the bandage dry. You can remove the bandage after about 5 hours.  Eat well-balanced meals for the next 24 hours.  Drink enough fluids to keep your urine clear or pale yellow.  Avoid drinking alcohol minimally until after eating.  Avoid smoking for at least 30 minutes after the procedure.  Avoid strenuous physical activity or heavy lifting or pulling for about 5 hours after the procedure.  Athletes should avoid strenuous exercise for 12 hours or more.  Change positions slowly for the remainder of the day to prevent light-headedness or fainting.  If you feel light-headed, lie down until the feeling subsides.  If you have bleeding from the needle insertion site, elevate your arm and press firmly on the site until the bleeding stops.  If bruising or bleeding appears under the skin, apply ice to the area for 15 to 20 minutes, 3 to 4 times per day. Put the ice in a plastic bag and place a towel between the bag of ice and your skin. Do this while you are awake for the first 24 hours. The ice packs can be stopped before 24 hours if the swelling goes away. If swelling persists after 24 hours, a warm, moist washcloth can be applied to the area for 15 to 20 minutes, 3 to 4 times per day. The warm, moist treatments can be stopped when the swelling goes away.  It is important to continue  further therapeutic phlebotomy as directed by your caregiver. SEEK MEDICAL CARE IF:  There is bleeding or fluid leaking from the needle insertion site.  The needle insertion site becomes swollen, red, or sore.  You feel light-headed, dizzy or nauseated, and the feeling does not go away.  You notice new bruising at the needle insertion site.  You feel more weak or tired than normal.  You develop a fever. SEEK IMMEDIATE MEDICAL CARE IF:   There is increased bleeding, pain, or swelling from the needle insertion site.  You have severe nausea or vomiting.  You have chest pain.  You have trouble breathing. MAKE SURE YOU:  Understand these instructions.  Will watch your condition.  Will get help right away if you are not doing well or get worse. Document Released: 07/14/2010 Document Revised: 06/26/2013 Document Reviewed: 07/14/2010 [facility] Patient Information 2015 [place], [place]. This information is not intended to replace advice given to you by your health care provider. Make sure you discuss any questions you have with your health care provider.   Therapeutic Phlebotomy Therapeutic phlebotomy is the controlled removal of blood from your body for the purpose of treating a medical condition. It is similar to donating blood. Usually, about a pint (470 mL) of blood is removed. The average adult has 9 to 12 pints (4.3 to 5.7 L) of blood. Therapeutic phlebotomy may be used to treat the following medical conditions:  Hemochromatosis. This is a condition in which there is too  much iron in the blood.  Polycythemia vera. This is a condition in which there are too many red cells in the blood.  Porphyria cutanea tarda. This is a disease usually passed from one generation to the next (inherited). It is a condition in which an important part of hemoglobin is not made properly. This results in the build up of abnormal amounts of porphyrins in the body.  Sickle cell disease. This is an  inherited disease. It is a condition in which the red blood cells form an abnormal crescent shape rather than a round shape. LET YOUR CAREGIVER KNOW ABOUT:  Allergies.  Medicines taken including herbs, eyedrops, over-the-counter medicines, and creams.  Use of steroids (by mouth or creams).  Previous problems with anesthetics or numbing medicine.  History of blood clots.  History of bleeding or blood problems.  Previous surgery.  Possibility of pregnancy, if this applies. RISKS AND COMPLICATIONS This is a simple and safe procedure. Problems are unlikely. However, problems can occur and may include:  Nausea or lightheadedness.  Low blood pressure.  Soreness, bleeding, swelling, or bruising at the needle insertion site.  Infection. BEFORE THE PROCEDURE  This is a procedure that can be done as an outpatient. Confirm the time that you need to arrive for your procedure. Confirm whether there is a need to fast or withhold any medications. It is helpful to wear clothing with sleeves that can be raised above the elbow. A blood sample may be done to determine the amount of red blood cells or iron in your blood. Plan ahead of time to have someone drive you home after the procedure. PROCEDURE The entire procedure from preparation through recovery takes about 1 hour. The actual collection takes about 10 to 15 minutes.  A needle will be inserted into your vein.  Tubing and a collection bag will be attached to that needle.  Blood will flow through the needle and tubing into the collection bag.  You may be asked to open and close your hand slowly and continuously during the entire collection.  Once the specified amount of blood has been removed from your body, the collection bag and tubing will be clamped.  The needle will be removed.  Pressure will be held on the site of the needle insertion to stop the bleeding. Then a bandage will be placed over the needle insertion site. AFTER THE  PROCEDURE  Your recovery will be assessed and monitored. If there are no problems, as an outpatient, you should be able to go home shortly after the procedure.  Document Released: 07/14/2010 Document Revised: 05/04/2011 Document Reviewed: 07/14/2010 [facility] Patient Information 2015 [place], [place]. This information is not intended to replace advice given to you by your health care provider. Make sure you discuss any questions you have with your health care provider.

## 2014-05-15 ENCOUNTER — Other Ambulatory Visit: Payer: Self-pay | Admitting: Neurology

## 2014-05-15 MED ORDER — OXYCODONE HCL 5 MG PO TABS: 5.0000 mg | ORAL_TABLET | Freq: Four times a day (QID) | ORAL | Status: DC | PRN

## 2014-05-15 NOTE — Telephone Encounter (Signed)
Pt is calling requesting a written Rx for oxyCODONE (OXY IR/ROXICODONE) 5 MG immediate release tablet.  Please call when ready for pick up.

## 2014-05-17 ENCOUNTER — Telehealth: Payer: Self-pay | Admitting: Hematology and Oncology

## 2014-05-17 NOTE — Telephone Encounter (Signed)
returned call and cx appt per patient request..Marland KitchenMarland KitchenMarland Kitchenpatient will call back to r/s due to not feeling well

## 2014-05-18 ENCOUNTER — Other Ambulatory Visit: Payer: PRIVATE HEALTH INSURANCE

## 2014-05-28 ENCOUNTER — Other Ambulatory Visit: Payer: Self-pay | Admitting: Hematology and Oncology

## 2014-06-13 ENCOUNTER — Telehealth: Payer: Self-pay | Admitting: Hematology and Oncology

## 2014-06-13 NOTE — Telephone Encounter (Signed)
pt called to cx all appts...said the phlebotomy made him sick and did not want to keep any appts including MD visit...Marland KitchenMarland Kitchen

## 2014-06-15 ENCOUNTER — Other Ambulatory Visit: Payer: PRIVATE HEALTH INSURANCE

## 2014-06-21 ENCOUNTER — Telehealth: Payer: Self-pay | Admitting: Neurology

## 2014-06-21 MED ORDER — TECFIDERA 240 MG PO CPDR: 240.0000 mg | DELAYED_RELEASE_CAPSULE | Freq: Two times a day (BID) | ORAL | Status: DC

## 2014-06-21 MED ORDER — AMPYRA 10 MG PO TB12: 10.0000 mg | ORAL_TABLET | Freq: Two times a day (BID) | ORAL | Status: DC

## 2014-06-21 MED ORDER — OXYCODONE HCL 5 MG PO TABS: 5.0000 mg | ORAL_TABLET | Freq: Four times a day (QID) | ORAL | Status: DC | PRN

## 2014-06-21 NOTE — Telephone Encounter (Signed)
Patient called requesting a refill for oxyCODONE (OXY IR/ROXICODONE) 5 MG immediate release tablet, TECFIDERA 240 MG CPDR, and AMPYRA 10 MG TB12. Pt's c/b [phone]

## 2014-06-21 NOTE — Telephone Encounter (Signed)
Ampyra and Tecfidera escribed to [employer] [facility].  Oxycodone printed, signed, up front [facility].  I spoke with Magda Paganini and let him know this has been done/fim

## 2014-07-12 ENCOUNTER — Ambulatory Visit (INDEPENDENT_AMBULATORY_CARE_PROVIDER_SITE_OTHER): Payer: 59 | Admitting: Neurology

## 2014-07-12 ENCOUNTER — Telehealth: Payer: Self-pay | Admitting: *Deleted

## 2014-07-12 ENCOUNTER — Telehealth: Payer: Self-pay | Admitting: Neurology

## 2014-07-12 ENCOUNTER — Encounter: Payer: Self-pay | Admitting: Neurology

## 2014-07-12 VITALS — BP 134/88 | HR 66 | Resp 14 | Ht 69.5 in | Wt 189.2 lb

## 2014-07-12 DIAGNOSIS — R3915 Urgency of urination: Secondary | ICD-10-CM | POA: Diagnosis not present

## 2014-07-12 DIAGNOSIS — M21372 Foot drop, left foot: Secondary | ICD-10-CM | POA: Diagnosis not present

## 2014-07-12 DIAGNOSIS — D751 Secondary polycythemia: Secondary | ICD-10-CM | POA: Diagnosis not present

## 2014-07-12 DIAGNOSIS — R269 Unspecified abnormalities of gait and mobility: Secondary | ICD-10-CM | POA: Diagnosis not present

## 2014-07-12 DIAGNOSIS — R261 Paralytic gait: Secondary | ICD-10-CM | POA: Diagnosis not present

## 2014-07-12 DIAGNOSIS — G35 Multiple sclerosis: Secondary | ICD-10-CM

## 2014-07-12 MED ORDER — OXYCODONE HCL 5 MG PO TABS: 5.0000 mg | ORAL_TABLET | Freq: Four times a day (QID) | ORAL | Status: DC | PRN

## 2014-07-12 MED ORDER — OXYBUTYNIN CHLORIDE 5 MG PO TABS: 5.0000 mg | ORAL_TABLET | Freq: Three times a day (TID) | ORAL | Status: DC

## 2014-07-12 NOTE — Telephone Encounter (Signed)
Porfirio with [employer] is calling regarding the patients pre authorization for Tecfidera. Please call and advise. Thank you.

## 2014-07-12 NOTE — Telephone Encounter (Signed)
Release faxed to [facility] requesting MRI brain/C-spine and for the CD to be mailed.

## 2014-07-12 NOTE — Progress Notes (Signed)
[facility]  PATIENT: Vincent Evans DOB: Sep 16, 1950  REFERRING CLINICIAN: Melinda Crutch HISTORY FROM: Patient  REASON FOR VISIT: Multiple Sclerosis   HISTORICAL  CHIEF COMPLAINT:  Chief Complaint  Patient presents with  . Multiple Sclerosis    He sts. he tolerates Tecfidera better--gi se are improving.  He still has difficulty with urinary urgency.--doesn't think Oxybutinin is helping much.  He has to wear depends.  Sts. walking is some worse--slower.  He c/o a little more pain in his hips but sts. Oxycodone helps.  He would like to discuss smoking cessation/fim    HISTORY OF PRESENT ILLNESS:  Vincent Evans is a 64 year old man  with multiple sclerosis.   Since his last visit, he notes gait is a little worse and slower  MS History:  Around 2013, he  presented with left leg weakness and gait spasticity and clumsiness. He had MRIs of the brain and spine. Findings were consistent with multiple sclerosis. He was referred to me and I placed him on Tecfidera. . If he does not note a difference then he should discontinue the medicine..   Initially, he had GI symptoms and diarrhea from Tecfidera but this is much better now.    He gets frequent CBC's due to his hematologic disorder and lymphocyte count is usually about 1.0  --- last test 11/2013.    Gait/strength/sensation:    His main problem with MS is poor gait. He has moderate weakness in the left leg and mild weakness in the right leg. Also has spasticity helped by baclofen.   He uses a cane when he walks with some benefit. He is very careful when he walks and has not fallen. He has a left foot drop. He does not note any numbness in the legs. Ampyra helps the gait a little bit.  He is unable to work. 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   Pain:   He will get pain that is worse in the right > left buttock and shoots from his back down the right leg at times.   His pain is helped by oxycodone -- takes 1  to 3 pills daily.   He notes his pain in the legs is worse when he lays down at night, especially down the right leg. He notes more spasticity at night also.   Higher dose of baclofen (4/day) has helped the pain a little bit.  He does not have side effects with the baclofen..  Bladder:   He notes bladder urgency and frequency and has had some incontinence. He is on oxybutynin 5 mg by mouth twice a day and he feels he gets some benefit from the medications, but not complete benefit.  He has 2 x nocturia most nights.    Vision:  He notes no MS related vision glasses.   He has readers and other glasses for driving.   He denies double vision.  Fatigue/Sleep:   He notes fatigue daily basis and feels that this has become more of a problem.  His fatigue is more mental than physical.  We discussed increasing exercise by joining a gym.   He sleeps fairly well most nights.    Mood/cognition   He has less depression since last visit and no recent tearfulness. He has some  Apathy.   He feels his cognition is doing well and he does activities on the computer without difficulty.      REVIEW OF SYSTEMS:  Constitutional: No fevers,  chills, sweats, or change in appetite Eyes: He has notes some visual blurring.   He denies double vision, eye pain Ear, nose and throat: No hearing loss, ear pain, nasal congestion, sore throat Cardiovascular: No chest pain, palpitations Respiratory:  He notes cough but no SOB GastrointestinaI: No nausea, vomiting, diarrhea, abdominal pain, fecal incontinence Genitourinary:  as above Musculoskeletal:  No neck pain, back pain Integumentary: No rash, pruritus, skin lesions Neurological: as above.  And has noted RLS Psychiatric: He notes mild depression at this time.  No anxiety Endocrine: No palpitations, diaphoresis, change in appetite, change in weigh or increased thirst Hematologic/Lymphatic:  No anemia, purpura, petechiae. Allergic/Immunologic: No itchy/runny eyes, nasal  congestion, recent allergic reactions, rashes  ALLERGIES: No Known Allergies  HOME MEDICATIONS: Outpatient Prescriptions Prior to Visit  Medication Sig Dispense Refill  . AMPYRA 10 MG TB12 Take 1 tablet (10 mg total) by mouth 2 (two) times daily. 60 tablet 11  . baclofen (LIORESAL) 10 MG tablet Take 10 mg by mouth as needed for muscle spasms (pt take 1-4 per day for muscle relaxant).    . folic acid (FOLVITE) 010 MCG tablet Take 800 mcg by mouth daily. Indicated for macrocytosis per pt    . LORazepam (ATIVAN) 1 MG tablet Take 1 tablet (1 mg total) by mouth daily as needed for anxiety (before phlebotomy). 10 tablet 0  . oxyCODONE (OXY IR/ROXICODONE) 5 MG immediate release tablet Take 1 tablet (5 mg total) by mouth every 6 (six) hours as needed for severe pain. 120 tablet 0  . TECFIDERA 240 MG CPDR Take 1 capsule (240 mg total) by mouth 2 (two) times daily. 60 capsule 11  . vitamin B-12 (CYANOCOBALAMIN) 1000 MCG tablet Take 5,000 mcg by mouth daily. Indicated for macrocytosis per pt    . Vitamin D, Ergocalciferol, (DRISDOL) 50000 UNITS CAPS capsule Take 5,000 Units by mouth daily. Indicated for MS supplement    . NON FORMULARY Take 240 mg by mouth 2 (two) times daily. Tecfidera (for MS)    . NON FORMULARY Take 10 mg by mouth 2 (two) times daily. Ampera (for MS)    . oxybutynin (DITROPAN) 5 MG tablet Take 5 mg by mouth 2 (two) times daily.     No facility-administered medications prior to visit.    PAST MEDICAL HISTORY: Past Medical History  Diagnosis Date  . Neuromuscular disorder     MS  . Multiple sclerosis   . 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 Anxiety 12/22/2013  . Hearing loss   . Movement disorder   . Vision abnormalities     PAST SURGICAL HISTORY: Past Surgical History  Procedure Laterality Date  . Bunionectomy      FAMILY HISTORY: Family History  Problem Relation Age of Onset  . Cancer Maternal Grandmother     pancreatic ca  . Dementia Mother   . Diabetes  type II Father   . Hypertension Father     SOCIAL HISTORY:  History   Social History  . Marital Status: Married    Spouse Name: N/A  . Number of Children: N/A  . Years of Education: N/A   Occupational History  . Not on file.   Social History Main Topics  . Smoking status: Current Every Day Smoker -- 1.00 packs/day for 35 years    Types: Cigarettes  . Smokeless tobacco: Never Used  . Alcohol Use: 0.0 oz/week    0 Standard drinks or equivalent per week     Comment: 3 to 6 oz. of  Bourbon daily, and an occasional beer./fim  . Drug Use: No  . Sexual Activity: Not on file   Other Topics Concern  . Not on file   Social History Narrative     PHYSICAL EXAM  Filed Vitals:   07/12/14 1308  BP: 134/88  Pulse: 66  Resp: 14  Height: 5' 9.5" (1.765 m)  Weight: 189 lb 3.2 oz (85.821 kg)    Body mass index is 27.55 kg/(m^2).   General: The patient is well-developed and well-nourished and in no acute distress  Eyes:  Funduscopic exam shows normal optic discs and retinal vessels.  Neck: The neck is supple, The neck is nontender.  Skin: Extremities are without significant edema.  Neurologic Exam  Mental status: The patient is alert and oriented x 3 at the time of the examination. The patient has apparent normal recent and remote memory, with an apparently normal attention span and concentration ability.   Speech is normal.  Cranial nerves: Extraocular movements are full. Pupils are equal, round, and reactive to light and accomodation.  Visual fields are full.  Facial symmetry is present. There is good facial sensation to soft touch bilaterally.Facial strength is normal.  Trapezius and sternocleidomastoid strength is normal. No dysarthria is noted.  The tongue is midline, and the patient has symmetric elevation of the soft palate. No obvious hearing deficits are noted.  Motor:  Muscle bulk is normal and toine is increased in legs, left > right. Strength is  5 / 5 in arms but  4+/5 in right hip flexors and ankle extensors.   He is  4-/5 in left hip flexors andankle extensors and 4/5 elsewhere in left leg.     Sensory: Sensory testing is intact to pinprick, soft touch, vibration sensation, and position sense on all 4 extremities.  Coordination: Cerebellar testing reveals good finger-nose-finger bilaterally.  Gait and station: Station is stable but gait shows left foot drop and some spasticity.   He can not tandem. Romberg is negative.   Reflexes: Deep tendon reflexes are increase in legs, left more than rightand normal . Plantar responses are normal.    DIAGNOSTIC DATA (LABS, IMAGING, TESTING) - I reviewed patient records, labs, notes, testing and imaging myself where available.  Lab Results  Component Value Date   WBC 6.7 04/20/2014   HGB 17.8* 04/20/2014   HCT 54.3* 04/20/2014   MCV 101.3* 04/20/2014   PLT 161 04/20/2014      ASSESSMENT AND PLAN  Multiple sclerosis - Plan: MR Brain W Wo Contrast, CBC with Differential/Platelet  Gait disturbance - Plan: MR Brain W Wo Contrast, CBC with Differential/Platelet  Spastic gait  Urinary urgency  Left foot drop  Secondary erythrocytosis   1. Continue Ampyra and Tecfidera.      Check CBC 2.  Consider AFO if foot drop worsens.   3.  MRI brain to determine if any subclinical progression.   If noted, then consider change to another agent.   4.   He will return to see me in 3 or 4 months or call sooner if he has new or worsening neurologic symptoms.  Richard A. Felecia Shelling, MD, PhD 5/45/6256, 3:89 PM Certified in Neurology, Clinical Neurophysiology, Sleep Medicine, Pain Medicine and Neuroimaging  [facility] [address], [place], [place] [address] [phone]

## 2014-07-12 NOTE — Telephone Encounter (Signed)
I called back.  Spoke with Scientist, clinical (histocompatibility and immunogenetics).  She wanted to let us know they are sending a Prior Auth Request for Tecfidera today.  Clinical info has been provided to ins, request is currently under review Ref Key: [id number]

## 2014-07-13 ENCOUNTER — Other Ambulatory Visit: Payer: PRIVATE HEALTH INSURANCE

## 2014-07-13 LAB — CBC WITH DIFFERENTIAL/PLATELET
BASOS: 1 %
Basophils Absolute: 0.1 10*3/uL (ref 0.0–0.2)
EOS (ABSOLUTE): 0.1 10*3/uL (ref 0.0–0.4)
EOS: 1 %
HEMATOCRIT: 53.1 % — AB (ref 37.5–51.0)
HEMOGLOBIN: 18.3 g/dL — AB (ref 12.6–17.7)
IMMATURE GRANULOCYTES: 0 %
Immature Grans (Abs): 0 10*3/uL (ref 0.0–0.1)
LYMPHS: 15 %
Lymphocytes Absolute: 1.5 10*3/uL (ref 0.7–3.1)
MCH: 33.5 pg — ABNORMAL HIGH (ref 26.6–33.0)
MCHC: 34.5 g/dL (ref 31.5–35.7)
MCV: 97 fL (ref 79–97)
MONOCYTES: 8 %
Monocytes Absolute: 0.9 10*3/uL (ref 0.1–0.9)
Neutrophils Absolute: 8 10*3/uL — ABNORMAL HIGH (ref 1.4–7.0)
Neutrophils: 75 %
Platelets: 201 10*3/uL (ref 150–379)
RBC: 5.47 x10E6/uL (ref 4.14–5.80)
RDW: 13.7 % (ref 12.3–15.4)
WBC: 10.6 10*3/uL (ref 3.4–10.8)

## 2014-07-16 NOTE — Telephone Encounter (Signed)
-----   Message from Britt Bottom, MD sent at 07/13/2014  9:19 AM EDT ----- Labs are ok for Tecfidera  --   show high hemoglobin (patient aware of that issue and sees hematology) and normal lymphocytes

## 2014-07-16 NOTE — Telephone Encounter (Signed)
LMOM (identified vm) that lymphocytes are normal, and hgb is high, but that he is already seeing hematology for this.  He may continue Tecfidera as rx'd.  He does not need to return this call unless he has a question or concern/fim

## 2014-07-19 ENCOUNTER — Telehealth: Payer: Self-pay

## 2014-07-19 NOTE — Telephone Encounter (Signed)
Vincent Evans has approved the request for coverage on Tecfidera effective until 07/17/2015 Ref # [id number].  They have notified the patient of this decision as well.

## 2014-08-10 ENCOUNTER — Ambulatory Visit: Payer: PRIVATE HEALTH INSURANCE | Admitting: Hematology and Oncology

## 2014-08-10 ENCOUNTER — Other Ambulatory Visit: Payer: PRIVATE HEALTH INSURANCE

## 2014-08-29 ENCOUNTER — Other Ambulatory Visit: Payer: Self-pay | Admitting: Neurology

## 2014-08-29 MED ORDER — OXYCODONE HCL 5 MG PO TABS: 5.0000 mg | ORAL_TABLET | Freq: Four times a day (QID) | ORAL | Status: DC | PRN

## 2014-08-29 NOTE — Telephone Encounter (Signed)
Patient is calling to order written Rx oxycodone.  Thanks!

## 2014-08-29 NOTE — Telephone Encounter (Signed)
Request entered, forwarded to provider for approval.  

## 2014-08-30 ENCOUNTER — Encounter: Payer: Self-pay | Admitting: *Deleted

## 2014-08-30 NOTE — Progress Notes (Signed)
Oxycodone rx. up front [facility]/fim 

## 2014-09-25 ENCOUNTER — Telehealth: Payer: Self-pay | Admitting: Neurology

## 2014-09-25 DIAGNOSIS — G35 Multiple sclerosis: Secondary | ICD-10-CM

## 2014-09-25 MED ORDER — OXYCODONE HCL 5 MG PO TABS: 5.0000 mg | ORAL_TABLET | Freq: Four times a day (QID) | ORAL | Status: DC | PRN

## 2014-09-25 NOTE — Telephone Encounter (Signed)
Pt called requesting refill on oxyCODONE (OXY IR/ROXICODONE) 5 MG immediate release tablet

## 2014-09-25 NOTE — Telephone Encounter (Signed)
Spoke w/ pt to let him know Rx is ready to be picked up at the front of office. Told him to bring valid photo ID with him. Pt verbalized understanding.

## 2014-09-25 NOTE — Telephone Encounter (Signed)
Okay to refill. Same dose and number as last time.

## 2014-11-01 ENCOUNTER — Encounter: Payer: Self-pay | Admitting: *Deleted

## 2014-11-01 ENCOUNTER — Other Ambulatory Visit: Payer: Self-pay | Admitting: Neurology

## 2014-11-01 DIAGNOSIS — G35 Multiple sclerosis: Secondary | ICD-10-CM

## 2014-11-01 MED ORDER — OXYCODONE HCL 5 MG PO TABS: 5.0000 mg | ORAL_TABLET | Freq: Four times a day (QID) | ORAL | Status: DC | PRN

## 2014-11-01 NOTE — Progress Notes (Signed)
Oxycodone rx. up front [facility]/fim 

## 2014-11-01 NOTE — Telephone Encounter (Signed)
Request entered, forwarded to provider for approval.  

## 2014-11-01 NOTE — Telephone Encounter (Signed)
Patient is calling to get a written Rx for oxyCODONE (OXY IR/ROXICODONE) 5 MG immediate release tablet. I advised the patient the Rx will be ready in 24 hours unless otherwise advised by the nurse.

## 2014-11-07 ENCOUNTER — Other Ambulatory Visit: Payer: Self-pay | Admitting: Family Medicine

## 2014-11-07 DIAGNOSIS — E049 Nontoxic goiter, unspecified: Secondary | ICD-10-CM

## 2014-11-08 ENCOUNTER — Ambulatory Visit
Admission: RE | Admit: 2014-11-08 | Discharge: 2014-11-08 | Disposition: A | Discharge status: Home / Self Care | Payer: PRIVATE HEALTH INSURANCE | Source: Ambulatory Visit | Attending: Family Medicine | Admitting: Family Medicine

## 2014-11-08 DIAGNOSIS — E049 Nontoxic goiter, unspecified: Secondary | ICD-10-CM

## 2014-11-12 ENCOUNTER — Encounter: Payer: Self-pay | Admitting: Neurology

## 2014-11-12 ENCOUNTER — Ambulatory Visit (INDEPENDENT_AMBULATORY_CARE_PROVIDER_SITE_OTHER): Payer: 59 | Admitting: Neurology

## 2014-11-12 VITALS — BP 164/92 | HR 64 | Resp 14 | Ht 69.5 in | Wt 198.4 lb

## 2014-11-12 DIAGNOSIS — M21372 Foot drop, left foot: Secondary | ICD-10-CM

## 2014-11-12 DIAGNOSIS — R261 Paralytic gait: Secondary | ICD-10-CM | POA: Diagnosis not present

## 2014-11-12 DIAGNOSIS — R3915 Urgency of urination: Secondary | ICD-10-CM | POA: Diagnosis not present

## 2014-11-12 DIAGNOSIS — F419 Anxiety disorder, unspecified: Secondary | ICD-10-CM

## 2014-11-12 DIAGNOSIS — G35 Multiple sclerosis: Secondary | ICD-10-CM

## 2014-11-12 MED ORDER — OXYCODONE HCL 5 MG PO TABS: 5.0000 mg | ORAL_TABLET | Freq: Four times a day (QID) | ORAL | Status: DC | PRN

## 2014-11-12 NOTE — Progress Notes (Signed)
[facility]  PATIENT: Vincent Evans DOB: 07/12/1950  REFERRING CLINICIAN: Melinda Crutch HISTORY FROM: Patient  REASON FOR VISIT: Multiple Sclerosis   HISTORICAL  CHIEF COMPLAINT:  Chief Complaint  Patient presents with  . Multiple Sclerosis    Sts. he continues to tolerate Tecfidera well.  Sts. he didn't feel Oxybutynin was helping bladder urgency, so pcp d/c this and started him on Tolterodine ER 4mg  daily.  Sts. gait/balance are about the same/fim    HISTORY OF PRESENT ILLNESS:  Vincent Evans is a 64 year old man  with multiple sclerosis.   He felt more fatigued outside over the summer.   He feels gait is his main problem and Ampyra has helped.    Gait/strength/sensation:    His main problem with MS is poor gait. He has moderate weakness in the left leg and mild weakness in the right leg. Also has spasticity helped by baclofen.   He uses a cane when he walks with some benefit. He is very careful when he walks and has not fallen. He has a left foot drop. He does not note any numbness in the legs. Ampyra helps the gait a little bit.  He is unable to work. 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   Pain:   He will get pain that is worse in the right > left buttock and shoots from his back down the right leg at times.   His pain is helped by oxycodone -- takes 1 to 3 pills daily.   He notes his pain in the legs is worse when he lays down at night, especially down the right leg. He notes more spasticity at night also.   Higher dose of baclofen (4/day) has helped the pain a little bit.  He does not have side effects with the baclofen..  Bladder:   He reports bladder urgency and frequency and has had some incontinence. Oxybutynin helped a bit but he was just switched to Detrol XR.  He has 2 x nocturia most nights.    Vision:  He notes no MS related vision glasses.   He has readers and other glasses for driving.   He denies double vision.  Fatigue/Sleep:    He notes fatigue daily basis and feels that this has become more of a problem.  His fatigue is more mental than physical. He is trying to be more active and has joined a gym.    He sleeps fairly well most nights.  He will wake up to use the bathroom but falls asleep easily.   He snores and has had some snorts at night.  Mood/cognition   Mood is doing better over the past few months.   He has less apathy.   He feels his cognition is doing well and he does activities on the computer without difficulty.    MS History:  Around 2013, he  presented with left leg weakness and gait spasticity and clumsiness. He had MRIs of the brain and spine. Findings were consistent with multiple sclerosis. He was referred to me and I placed him on Tecfidera. . If he does not note a difference then he should discontinue the medicine..   Initially, he had GI symptoms and diarrhea from Tecfidera but this is much better now.    He gets frequent CBC's due to his hematologic disorder and lymphocyte count is usually about 1.0.     REVIEW OF SYSTEMS:  Constitutional: No fevers, chills, sweats, or change  in appetite.   He snores and has some insomnia.  He has heat intolerance Eyes: He has notes some visual blurring.  Eyes itch.    He denies double vision, eye pain Ear, nose and throat: No hearing loss, ear pain, nasal congestion, sore throat Cardiovascular: No chest pain, palpitations Respiratory:  He notes cough but no SOB GastrointestinaI: No nausea, vomiting, diarrhea, abdominal pain, fecal incontinence Genitourinary:  as above Musculoskeletal:  No neck pain, some back pain Integumentary: No rash, pruritus, skin lesions Neurological: as above.  And has noted RLS Psychiatric: He notes mild depression at this time.  No anxiety Endocrine: No palpitations, diaphoresis, change in appetite, change in weight or increased thirst Hematologic/Lymphatic:  No anemia, purpura, petechiae. Allergic/Immunologic: No itchy/runny eyes,  nasal congestion, recent allergic reactions, rashes  ALLERGIES: No Known Allergies  HOME MEDICATIONS: Outpatient Prescriptions Prior to Visit  Medication Sig Dispense Refill  . AMPYRA 10 MG TB12 Take 1 tablet (10 mg total) by mouth 2 (two) times daily. 60 tablet 11  . baclofen (LIORESAL) 10 MG tablet Take 10 mg by mouth as needed for muscle spasms (pt take 1-4 per day for muscle relaxant).    . folic acid (FOLVITE) 601 MCG tablet Take 800 mcg by mouth daily. Indicated for macrocytosis per pt    . oxyCODONE (OXY IR/ROXICODONE) 5 MG immediate release tablet Take 1 tablet (5 mg total) by mouth every 6 (six) hours as needed for severe pain. 120 tablet 0  . TECFIDERA 240 MG CPDR Take 1 capsule (240 mg total) by mouth 2 (two) times daily. 60 capsule 11  . vitamin B-12 (CYANOCOBALAMIN) 1000 MCG tablet Take 5,000 mcg by mouth daily. Indicated for macrocytosis per pt    . LORazepam (ATIVAN) 1 MG tablet Take 1 tablet (1 mg total) by mouth daily as needed for anxiety (before phlebotomy). (Patient not taking: Reported on 11/12/2014) 10 tablet 0  . oxybutynin (DITROPAN) 5 MG tablet Take 1 tablet (5 mg total) by mouth 3 (three) times daily. (Patient not taking: Reported on 11/12/2014) 90 tablet 11  . Vitamin D, Ergocalciferol, (DRISDOL) 50000 UNITS CAPS capsule Take 5,000 Units by mouth daily. Indicated for MS supplement     No facility-administered medications prior to visit.    PAST MEDICAL HISTORY: Past Medical History  Diagnosis Date  . Neuromuscular disorder     MS  . Multiple sclerosis   . 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 Anxiety 12/22/2013  . Hearing loss   . Movement disorder   . Vision abnormalities     PAST SURGICAL HISTORY: Past Surgical History  Procedure Laterality Date  . Bunionectomy      FAMILY HISTORY: Family History  Problem Relation Age of Onset  . Cancer Maternal Grandmother     pancreatic ca  . Dementia Mother   . Diabetes type II Father   . Hypertension  Father     SOCIAL HISTORY:  Social History   Social History  . Marital Status: Married    Spouse Name: N/A  . Number of Children: N/A  . Years of Education: N/A   Occupational History  . Not on file.   Social History Main Topics  . Smoking status: Current Every Day Smoker -- 1.00 packs/day for 35 years    Types: Cigarettes  . Smokeless tobacco: Never Used  . Alcohol Use: 0.0 oz/week    0 Standard drinks or equivalent per week     Comment: 3 to 6 oz. of Bourbon daily, and an occasional  beer./fim  . Drug Use: No  . Sexual Activity: Not on file   Other Topics Concern  . Not on file   Social History Narrative     PHYSICAL EXAM  Filed Vitals:   11/12/14 1323  BP: 164/92  Pulse: 64  Resp: 14  Height: 5' 9.5" (1.765 m)  Weight: 198 lb 6.4 oz (89.994 kg)    Body mass index is 28.89 kg/(m^2).   General: The patient is well-developed and well-nourished and in no acute distress  Neurologic Exam  Mental status: The patient is alert and oriented x 3 at the time of the examination. The patient has apparent normal recent and remote memory, with an apparently normal attention span and concentration ability.   Speech is normal.  Cranial nerves: Extraocular movements are full.   Facial symmetry is present. There is good facial sensation to soft touch bilaterally.Facial strength is normal.  Trapezius and sternocleidomastoid strength is normal. No dysarthria is noted.  The tongue is midline, and the patient has symmetric elevation of the soft palate. No obvious hearing deficits are noted.  Motor:  Muscle bulk is normal and toine is increased in legs, left > right. Strength is  5 / 5 in arms but 4+/5 in right hip flexors and ankle extensors.   4-/5 in left hip flexors,  4/5  Ankle/toe extensors and 4+/5 elsewhere in left leg.     Sensory: Sensory testing is intact to pinprick, soft touch, vibration sensation, and position sense on all 4 extremities.  Coordination: Cerebellar  testing reveals good finger-nose-finger bilaterally.  Gait and station: Station is stable but gait shows left foot drop and some spasticity.   He can not tandem. Romberg is negative.   Reflexes: Deep tendon reflexes are increase in legs, left more than rightand normal . Plantar responses are normal.    DIAGNOSTIC DATA (LABS, IMAGING, TESTING) - I reviewed patient records, labs, notes, testing and imaging myself where available.  Lab Results  Component Value Date   WBC 10.6 07/12/2014   HGB 17.8* 04/20/2014   HCT 53.1* 07/12/2014   MCV 101.3* 04/20/2014   PLT 161 04/20/2014      ASSESSMENT AND PLAN  Multiple sclerosis  Left foot drop  Spastic gait  Urinary urgency  Anxiety   1. Continue Tecfidera -- he gets labs elsewhere and I asked him to forward results.     Refill other med's.   2.  Ampyra for foot drop and gait.   Consider AFO if foot drop worsens.   3.  Recommended MRI to rule out subclinical progression but he prefers to wait.   Recommend PSG to assess for PSG.   He prefers to wait. 4.   He will return to see me in 3 or 4 months or call sooner if he has new or worsening neurologic symptoms.  Richard A. Felecia Shelling, MD, PhD 9/93/5701, 7:79 PM Certified in Neurology, Clinical Neurophysiology, Sleep Medicine, Pain Medicine and Neuroimaging  [facility] [address] [place],  [address] [phone]

## 2015-01-07 ENCOUNTER — Other Ambulatory Visit: Payer: Self-pay | Admitting: Neurology

## 2015-01-07 ENCOUNTER — Encounter: Payer: Self-pay | Admitting: *Deleted

## 2015-01-07 DIAGNOSIS — G35 Multiple sclerosis: Secondary | ICD-10-CM

## 2015-01-07 MED ORDER — OXYCODONE HCL 5 MG PO TABS
5.0000 mg | ORAL_TABLET | Freq: Four times a day (QID) | ORAL | Status: DC | PRN
Start: 2015-01-07 — End: 2015-02-11

## 2015-01-07 NOTE — Telephone Encounter (Signed)
Patient is calling to order written Rx oxycodone 5 mg.  Thanks!

## 2015-01-07 NOTE — Progress Notes (Signed)
Oxycodone rx. up front [facility]/fim 

## 2015-01-07 NOTE — Telephone Encounter (Signed)
Request entered, forwarded to provider for approval.  

## 2015-02-08 DIAGNOSIS — N3941 Urge incontinence: Secondary | ICD-10-CM | POA: Diagnosis not present

## 2015-02-11 ENCOUNTER — Telehealth: Payer: Self-pay | Admitting: Neurology

## 2015-02-11 ENCOUNTER — Encounter: Payer: Self-pay | Admitting: *Deleted

## 2015-02-11 ENCOUNTER — Other Ambulatory Visit: Payer: Self-pay | Admitting: Neurology

## 2015-02-11 DIAGNOSIS — G35 Multiple sclerosis: Secondary | ICD-10-CM

## 2015-02-11 DIAGNOSIS — D751 Secondary polycythemia: Secondary | ICD-10-CM

## 2015-02-11 MED ORDER — OXYCODONE HCL 5 MG PO TABS: 5.0000 mg | ORAL_TABLET | Freq: Four times a day (QID) | ORAL | Status: DC | PRN

## 2015-02-11 MED ORDER — BACLOFEN 10 MG PO TABS: 10.0000 mg | ORAL_TABLET | ORAL | Status: DC | PRN

## 2015-02-11 NOTE — Telephone Encounter (Signed)
I called back and spoke with Joe.  Rx has been verified.

## 2015-02-11 NOTE — Telephone Encounter (Signed)
Joe with  [facility] is calling to get clarification on Rx Baclofen 10 mg dosage.  Thanks!

## 2015-02-11 NOTE — Progress Notes (Signed)
Oxycodone rx. up front [facility]/fim 

## 2015-02-11 NOTE — Telephone Encounter (Signed)
Requests entered, forwarded to provider for review.  

## 2015-02-11 NOTE — Telephone Encounter (Signed)
Patient called requesting refill of baclofen (LIORESAL) 10 MG tablet to [facility]. And refill of oxyCODONE (OXY IR/ROXICODONE) 5 MG immediate release tablet.

## 2015-02-21 ENCOUNTER — Other Ambulatory Visit: Payer: Self-pay | Admitting: Hematology and Oncology

## 2015-03-14 ENCOUNTER — Ambulatory Visit (INDEPENDENT_AMBULATORY_CARE_PROVIDER_SITE_OTHER): Payer: Medicare HMO | Admitting: Neurology

## 2015-03-14 ENCOUNTER — Encounter: Payer: Self-pay | Admitting: Neurology

## 2015-03-14 VITALS — BP 144/72 | HR 78 | Resp 18 | Ht 69.5 in | Wt 202.2 lb

## 2015-03-14 DIAGNOSIS — M21372 Foot drop, left foot: Secondary | ICD-10-CM

## 2015-03-14 DIAGNOSIS — F419 Anxiety disorder, unspecified: Secondary | ICD-10-CM | POA: Diagnosis not present

## 2015-03-14 DIAGNOSIS — G35 Multiple sclerosis: Secondary | ICD-10-CM

## 2015-03-14 DIAGNOSIS — R3915 Urgency of urination: Secondary | ICD-10-CM | POA: Diagnosis not present

## 2015-03-14 DIAGNOSIS — R261 Paralytic gait: Secondary | ICD-10-CM

## 2015-03-14 DIAGNOSIS — R69 Illness, unspecified: Secondary | ICD-10-CM | POA: Diagnosis not present

## 2015-03-14 MED ORDER — BUPROPION HCL ER (XL) 300 MG PO TB24: 300.0000 mg | ORAL_TABLET | Freq: Every day | ORAL | Status: DC

## 2015-03-14 MED ORDER — OXYCODONE HCL 5 MG PO TABS: 5.0000 mg | ORAL_TABLET | Freq: Four times a day (QID) | ORAL | Status: DC | PRN

## 2015-03-14 NOTE — Progress Notes (Signed)
[facility]  PATIENT: Vincent Evans DOB: 1950/08/23  REFERRING CLINICIAN: Melinda Crutch HISTORY FROM: Patient  REASON FOR VISIT: Multiple Sclerosis   HISTORICAL  CHIEF COMPLAINT:  Chief Complaint  Patient presents with  . Multiple Sclerosis    Sts.  he tolerates Tecfidera well.  He has a cousin with MS who used to take Tecfidera and is now on Aubagio.  He sts. she feels much better on Aubagio.  He would like to discuss possibly switching to Aubagio./fim    HISTORY OF PRESENT ILLNESS:  Vincent Evans is a 65 year old man  with multiple sclerosis.   He felt more fatigued outside over the summer.   He feels gait is his main problem and Ampyra has helped a little bit.      Gait/strength/sensation:    Gait is poor due to moderate weakness in the left leg and mild weakness in the right leg. He has a left foot drop. He also has spasticity helped by baclofen.   He uses a cane.   He fell once since his last visit.    His cane actually broke and he fell.  He deneis numbness in the legs. He thinks the Ampyra helps the gait a little bit.  He is unable to work. 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   Pain:   He will get pain that is worse in the right > left buttock and shoots from his back down the right leg at times.   His pain is helped by oxycodone -- takes 1 to 3 pills daily.   He notes his pain in the legs is worse when he lays down at night, especially down the right leg. He notes more spasticity at night also.   Higher dose of baclofen (4/day) has helped the pain a little bit.  He does not have side effects with the baclofen..  Bladder:   He reports bladder urgency and frequency and has had some incontinence. Oxybutynin and Detrol have only helped mildly.  He has 2 x nocturia most nights.    Vision:  He notes no MS related vision glasses.   He has readers and other glasses for driving.   He denies double vision.  Fatigue/Sleep:   He notes fatigue  daily basis and feels that this has become more of a problem.  His fatigue is more mental than physical. He is trying to be more active and has joined a gym.    He sleeps fairly well most nights.  He will wake up to use the bathroom but falls asleep easily.   He snores and has had some snorts at night.   We have discussed possibility of OSA but he does not want to do PSG  Mood/cognition   Mood is doing better over the past few months.   He has less apathy.   He feels his cognition is doing well and he does activities on the computer without difficulty.      Smoking Cessation:  We discussed smoking cessation.  He  Almost quit with Wellbutrin years ago but was not motivated enough to do so.   He feels she might be able to this time.     MS History:  Around 2013, he  presented with left leg weakness and gait spasticity and clumsiness. He had MRIs of the brain and spine. Findings were consistent with multiple sclerosis. He was referred to me and I placed him on Tecfidera.  Initially, he  had GI symptoms and diarrhea from Tecfidera but this is much better now.    He gets frequent CBC's due to his hematologic disorder and lymphocyte count is usually about 1.0.     REVIEW OF SYSTEMS:  Constitutional: No fevers, chills, sweats, or change in appetite.   He snores and has some insomnia.  He has heat intolerance Eyes: He has notes some visual blurring.  Eyes itch.    He denies double vision, eye pain Ear, nose and throat: No hearing loss, ear pain, nasal congestion, sore throat Cardiovascular: No chest pain, palpitations Respiratory:  He notes cough but no SOB GastrointestinaI: No nausea, vomiting, diarrhea, abdominal pain, fecal incontinence Genitourinary:  as above Musculoskeletal:  No neck pain, some back pain Integumentary: No rash, pruritus, skin lesions Neurological: as above.  And has noted RLS Psychiatric: He notes mild depression at this time.  No anxiety Endocrine: No palpitations, diaphoresis,  change in appetite, change in weight or increased thirst Hematologic/Lymphatic:  No anemia, purpura, petechiae. Allergic/Immunologic: No itchy/runny eyes, nasal congestion, recent allergic reactions, rashes  ALLERGIES: No Known Allergies  HOME MEDICATIONS: Outpatient Prescriptions Prior to Visit  Medication Sig Dispense Refill  . AMPYRA 10 MG TB12 Take 1 tablet (10 mg total) by mouth 2 (two) times daily. 60 tablet 11  . aspirin 81 MG tablet Take 81 mg by mouth daily.    . baclofen (LIORESAL) 10 MG tablet Take 1 tablet (10 mg total) by mouth as needed for muscle spasms (pt take 1-4 per day for muscle relaxant). 120 tablet 6  . clotrimazole (LOTRIMIN) 1 % cream Apply 1 application topically 2 (two) times daily.    . folic acid (FOLVITE) Q000111Q MCG tablet Take 800 mcg by mouth daily. Indicated for macrocytosis per pt    . omeprazole (PRILOSEC OTC) 20 MG tablet Take 20 mg by mouth daily.    Marland Kitchen oxyCODONE (OXY IR/ROXICODONE) 5 MG immediate release tablet Take 1 tablet (5 mg total) by mouth every 6 (six) hours as needed for severe pain. 120 tablet 0  . TECFIDERA 240 MG CPDR Take 1 capsule (240 mg total) by mouth 2 (two) times daily. 60 capsule 11  . vitamin B-12 (CYANOCOBALAMIN) 1000 MCG tablet Take 5,000 mcg by mouth daily. Indicated for macrocytosis per pt    . Vitamin D, Ergocalciferol, (DRISDOL) 50000 UNITS CAPS capsule Take 5,000 Units by mouth daily. Indicated for MS supplement    . LORazepam (ATIVAN) 1 MG tablet Take 1 tablet (1 mg total) by mouth daily as needed for anxiety (before phlebotomy). (Patient not taking: Reported on 11/12/2014) 10 tablet 0  . oxybutynin (DITROPAN) 5 MG tablet Take 1 tablet (5 mg total) by mouth 3 (three) times daily. (Patient not taking: Reported on 11/12/2014) 90 tablet 11  . PATADAY 0.2 % SOLN Reported on 03/14/2015  5  . tolterodine (DETROL LA) 4 MG 24 hr capsule Take 4 mg by mouth daily. Reported on 03/14/2015  0   No facility-administered medications prior to visit.     PAST MEDICAL HISTORY: Past Medical History  Diagnosis Date  . Neuromuscular disorder ([place])     MS  . Multiple sclerosis ([place])   . 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 Anxiety 12/22/2013  . Hearing loss   . Movement disorder   . Vision abnormalities     PAST SURGICAL HISTORY: Past Surgical History  Procedure Laterality Date  . Bunionectomy      FAMILY HISTORY: Family History  Problem Relation Age of Onset  .  Cancer Maternal Grandmother     pancreatic ca  . Dementia Mother   . Diabetes type II Father   . Hypertension Father     SOCIAL HISTORY:  Social History   Social History  . Marital Status: Married    Spouse Name: N/A  . Number of Children: N/A  . Years of Education: N/A   Occupational History  . Not on file.   Social History Main Topics  . Smoking status: Current Every Day Smoker -- 1.00 packs/day for 35 years    Types: Cigarettes  . Smokeless tobacco: Never Used  . Alcohol Use: 0.0 oz/week    0 Standard drinks or equivalent per week     Comment: 3 to 6 oz. of Bourbon daily, and an occasional beer./fim  . Drug Use: No  . Sexual Activity: Not on file   Other Topics Concern  . Not on file   Social History Narrative     PHYSICAL EXAM  Filed Vitals:   03/14/15 1138  BP: 144/72  Pulse: 78  Resp: 18  Height: 5' 9.5" (1.765 m)  Weight: 202 lb 3.2 oz (91.717 kg)    Body mass index is 29.44 kg/(m^2).   General: The patient is well-developed and well-nourished and in no acute distress  Neurologic Exam  Mental status: The patient is alert and oriented x 3 at the time of the examination. The patient has apparent normal recent and remote memory, with an apparently normal attention span and concentration ability.   Speech is normal.  Cranial nerves: Extraocular movements are full.   Facial symmetry is present. There is good facial sensation to soft touch bilaterally.  Facial strength is normal.  Trapezius and sternocleidomastoid strength is  normal. No dysarthria is noted.  The tongue is midline, and the patient has symmetric elevation of the soft palate. No obvious hearing deficits are noted.  Motor:  Muscle bulk is normal and tone is increased in legs, left > right. Strength is  5 / 5 in arms but 4+/5 in right hip flexors and ankle extensors.   4-/5 in left hip flexors,  4/5  Ankle/toe extensors and 4+/5 elsewhere in left leg.     Sensory: Sensory testing is intact to pinprick, soft touch, vibration sensation, and position sense on all 4 extremities.  Coordination: Cerebellar testing reveals good finger-nose-finger bilaterally.  Gait and station: Station is stable but gait shows left foot drop and some spasticity.   He can not tandem. Romberg is negative.   Reflexes: Deep tendon reflexes are increase in legs, left more than right and normal . Plantar responses are normal.    DIAGNOSTIC DATA (LABS, IMAGING, TESTING) - I reviewed patient records, labs, notes, testing and imaging myself where available.  Lab Results  Component Value Date   WBC 10.6 07/12/2014   HGB 17.8* 04/20/2014   HCT 53.1* 07/12/2014   MCV 97 07/12/2014   PLT 201 07/12/2014      ASSESSMENT AND PLAN  Multiple sclerosis ([facility])  Left foot drop  Spastic gait  Urinary urgency  Anxiety   1. Continue Tecfidera -- CBC with diff to r/o lymphopenia.        2.  Ampyra for foot drop and gait (he might do trial off and then back on to make sure he is still getting a benefit).   We discussed AFO if foot drop worsens.   3.  MRI to rule out subclinical progression, last one at [facility] and I will get copy.  Recommend PSG to assess for PSG.   He prefers to wait. 4.   He will return to see me in 3 or 4 months or call sooner if he has new or worsening neurologic symptoms.  Richard A. Felecia Shelling, MD, PhD [id number], [id number] AM Certified in Neurology, Clinical Neurophysiology, Sleep Medicine, Pain Medicine and Neuroimaging  [facility] [address] [place], [place] [address] [phone] *

## 2015-03-15 LAB — CBC WITH DIFFERENTIAL/PLATELET
BASOS: 0 %
Basophils Absolute: 0 10*3/uL (ref 0.0–0.2)
EOS (ABSOLUTE): 0.1 10*3/uL (ref 0.0–0.4)
Eos: 1 %
HEMOGLOBIN: 18 g/dL — AB (ref 12.6–17.7)
Hematocrit: 51.1 % — ABNORMAL HIGH (ref 37.5–51.0)
IMMATURE GRANS (ABS): 0.1 10*3/uL (ref 0.0–0.1)
IMMATURE GRANULOCYTES: 1 %
LYMPHS: 13 %
Lymphocytes Absolute: 1.2 10*3/uL (ref 0.7–3.1)
MCH: 36.5 pg — AB (ref 26.6–33.0)
MCHC: 35.2 g/dL (ref 31.5–35.7)
MCV: 104 fL — AB (ref 79–97)
MONOCYTES: 11 %
Monocytes Absolute: 1.1 10*3/uL — ABNORMAL HIGH (ref 0.1–0.9)
NEUTROS ABS: 7.1 10*3/uL — AB (ref 1.4–7.0)
NEUTROS PCT: 74 %
PLATELETS: 166 10*3/uL (ref 150–379)
RBC: 4.93 x10E6/uL (ref 4.14–5.80)
RDW: 13.6 % (ref 12.3–15.4)
WBC: 9.6 10*3/uL (ref 3.4–10.8)

## 2015-03-18 ENCOUNTER — Telehealth: Payer: Self-pay | Admitting: *Deleted

## 2015-03-18 NOTE — Telephone Encounter (Signed)
LMOM (identified vm) that per RAS, labs done in our office are ok; continue meds as rx'd and call with any questions/fim

## 2015-03-18 NOTE — Telephone Encounter (Signed)
-----   Message from Britt Bottom, MD sent at 03/15/2015  4:50 PM EST ----- Please note that the lymphocyte count is normal.

## 2015-03-21 ENCOUNTER — Other Ambulatory Visit: Payer: Self-pay | Admitting: Neurology

## 2015-03-25 ENCOUNTER — Ambulatory Visit
Admission: RE | Admit: 2015-03-25 | Discharge: 2015-03-25 | Disposition: A | Discharge status: Home / Self Care | Payer: Medicare HMO | Source: Ambulatory Visit | Attending: Neurology | Admitting: Neurology

## 2015-03-25 DIAGNOSIS — R261 Paralytic gait: Secondary | ICD-10-CM

## 2015-03-25 DIAGNOSIS — G35 Multiple sclerosis: Secondary | ICD-10-CM

## 2015-03-25 DIAGNOSIS — M21372 Foot drop, left foot: Secondary | ICD-10-CM

## 2015-03-25 MED ORDER — GADOBENATE DIMEGLUMINE 529 MG/ML IV SOLN
20.0000 mL | Freq: Once | INTRAVENOUS | Status: AC | PRN
Start: 2015-03-25 — End: 2015-03-25
  Administered 2015-03-25: 20 mL via INTRAVENOUS

## 2015-03-26 ENCOUNTER — Telehealth: Payer: Self-pay | Admitting: *Deleted

## 2015-03-26 NOTE — Telephone Encounter (Signed)
I have spoken with Vincent Evans this morning and per RAS, advised that he  has compared recent mri with the last one done at [facility] and there have been no new MS changes.  He verbalized understanding of same/fim

## 2015-03-26 NOTE — Telephone Encounter (Signed)
-----   Message from Britt Bottom, MD sent at 03/26/2015  8:25 AM EST ----- Please let Mr. Bellaire know that the MRI of the brain is unchanged from the one he had 2014

## 2015-04-23 ENCOUNTER — Telehealth: Payer: Self-pay | Admitting: Neurology

## 2015-04-23 DIAGNOSIS — G35 Multiple sclerosis: Secondary | ICD-10-CM

## 2015-04-23 MED ORDER — OXYCODONE HCL 5 MG PO TABS: 5.0000 mg | ORAL_TABLET | Freq: Four times a day (QID) | ORAL | Status: DC | PRN

## 2015-04-23 NOTE — Telephone Encounter (Signed)
Pt called request refill for oxyCODONE (OXY IR/ROXICODONE) 5 MG immediate release tablet .

## 2015-04-23 NOTE — Telephone Encounter (Signed)
Rx. awaiting RAS sig/fim 

## 2015-04-24 NOTE — Telephone Encounter (Signed)
Pt called to check on when his rx would be ready for pick. Requesting a call when ready. [phone]

## 2015-04-24 NOTE — Telephone Encounter (Signed)
Oxycodone rx. up front [facility].  Pt. aware/fim

## 2015-05-16 ENCOUNTER — Telehealth: Payer: Self-pay | Admitting: Neurology

## 2015-05-16 NOTE — Telephone Encounter (Signed)
Patient called to advise of insurance change to [facility], TECFIDERA 240 MG CPDR now goes through [employer] [phone]. [employer] has approved for (0) copay, however needs Dr. Felecia Shelling or Faith to call to confirm Rx for Tecfidera. Patient request phone call to [place] today.

## 2015-05-16 NOTE — Telephone Encounter (Signed)
I have spoken with Vincent Evans and let him know I have called [facility] and given rx. for Tecfidera.  I spoke with Cecille Rubin, pharmacist.  She sts. they have not received a referral from [place] yet, but she took rx. and will put it on file so that when they do receive referral she can fill rx.  Les verbalized understanding of same/fim

## 2015-05-16 NOTE — Telephone Encounter (Signed)
Patient requests a phone call back once this has been taken care of with [place].

## 2015-05-28 ENCOUNTER — Telehealth: Payer: Self-pay | Admitting: Neurology

## 2015-05-28 DIAGNOSIS — G35 Multiple sclerosis: Secondary | ICD-10-CM

## 2015-05-28 MED ORDER — OXYCODONE HCL 5 MG PO TABS: 5.0000 mg | ORAL_TABLET | Freq: Four times a day (QID) | ORAL | Status: DC | PRN

## 2015-05-28 NOTE — Telephone Encounter (Signed)
Patient called to request refill of oxyCODONE (OXY IR/ROXICODONE) 5 MG immediate release tablet °

## 2015-05-28 NOTE — Telephone Encounter (Signed)
Rx. awaiting RAS sig/fim 

## 2015-05-28 NOTE — Telephone Encounter (Signed)
Oxycodone rx. up front [facility]/fim 

## 2015-06-21 ENCOUNTER — Telehealth: Payer: Self-pay | Admitting: Neurology

## 2015-06-21 NOTE — Telephone Encounter (Signed)
I will have to defer this to Faith, RN and Dr. Felecia Shelling.

## 2015-06-21 NOTE — Telephone Encounter (Signed)
Rx. for Ampyra 10mg , one po bid #90 with 3 r/f called in to [facility] at below #.  I have spoken with Romilda Joy and let him know this has been done/fim

## 2015-06-21 NOTE — Telephone Encounter (Signed)
Pt's wife called said pt has been out of AMPYRA 10 MG TB12 for over a week. He is on the PAP now and needs new VO RX called to [phone] opt 2 (pharmacy) and they will accept a verbal RX. This medication will ship out today. She sts the patient can hardly walk now since being off medication for so long. Please touch base with pt when this has been completed at [phone]

## 2015-07-12 ENCOUNTER — Encounter: Payer: Self-pay | Admitting: Neurology

## 2015-07-12 ENCOUNTER — Ambulatory Visit (INDEPENDENT_AMBULATORY_CARE_PROVIDER_SITE_OTHER): Payer: Medicare HMO | Admitting: Neurology

## 2015-07-12 VITALS — BP 154/94 | HR 55 | Ht 69.5 in | Wt 206.0 lb

## 2015-07-12 DIAGNOSIS — M21372 Foot drop, left foot: Secondary | ICD-10-CM

## 2015-07-12 DIAGNOSIS — F419 Anxiety disorder, unspecified: Secondary | ICD-10-CM

## 2015-07-12 DIAGNOSIS — G35 Multiple sclerosis: Secondary | ICD-10-CM

## 2015-07-12 DIAGNOSIS — R261 Paralytic gait: Secondary | ICD-10-CM | POA: Diagnosis not present

## 2015-07-12 DIAGNOSIS — R3915 Urgency of urination: Secondary | ICD-10-CM | POA: Diagnosis not present

## 2015-07-12 DIAGNOSIS — M5431 Sciatica, right side: Secondary | ICD-10-CM | POA: Diagnosis not present

## 2015-07-12 DIAGNOSIS — R69 Illness, unspecified: Secondary | ICD-10-CM | POA: Diagnosis not present

## 2015-07-12 DIAGNOSIS — D751 Secondary polycythemia: Secondary | ICD-10-CM

## 2015-07-12 MED ORDER — BACLOFEN 10 MG PO TABS: 10.0000 mg | ORAL_TABLET | ORAL | Status: DC | PRN

## 2015-07-12 MED ORDER — OXYCODONE HCL 5 MG PO TABS: 5.0000 mg | ORAL_TABLET | Freq: Four times a day (QID) | ORAL | Status: DC | PRN

## 2015-07-12 NOTE — Progress Notes (Signed)
[facility]  PATIENT: Vincent Evans DOB: 1950-12-11  REFERRING CLINICIAN: Melinda Crutch HISTORY FROM: Patient  REASON FOR VISIT: Multiple Sclerosis   HISTORICAL  CHIEF COMPLAINT:  Chief Complaint  Patient presents with  . Follow-up    Using own program for quitting smoking.  Using cane.  MS stable.  . Multiple Sclerosis    HISTORY OF PRESENT ILLNESS:  Vincent Evans is a 65 year old man  with multiple sclerosis.   He felt more fatigued outside over the summer.   He feels gait is his main problem and Ampyra has helped a little bit.    He is on Tecfidera but has had difficulty with the patient assistance program.    He tolerates it ok -- no GI issues and occasional flushing  Gait/strength/sensation:    Gait is poor due to moderate weakness in the left leg and mild weakness in the right leg. He has a left foot drop. He also has spasticity helped by baclofen.  Ampyra is helping his gait a lot (he stopped x 2-3 weeks and noticed a big difference)   He uses a cane.   He denies numbness in the legs.     Pain:   He has right > left buttock pain that shoots from his back down the right leg at times.   His pain is helped by oxycodone -- takes 1 to 3 pills daily.   He notes his pain in the legs is worse when he lays down at night, especially down the right leg. He notes more spasticity at night also.   Higher dose of baclofen (4/day) has helped the pain a little bit.  He does not have side effects with the baclofen..  Bladder:   He reports bladder urgency and frequency and has had some incontinence. Oxybutynin and Detrol have only helped mildly.  He has 2 x nocturia most nights.    Vision:  He notes no MS related vision problems.  No diplopia or major acuity issues.   He has readers and other glasses for driving.     Fatigue/Sleep:   He has fatigue everyday.  His fatigue is more mental than physical. He is trying to be more active and has joined a gym.  Fatigue is worse the past year.    He sleeps fairly well most nights.  He will wake up to use the bathroom but falls asleep easily.  He naps most days and feels much better afterwards.   He snores and has had some snorts at night.   He does not want to get a PSG and would not wear CPAP or an oral appliance.    Mood/cognition   Mood is doing well with less no significant depression .   He has less apathy.   He feels his cognition is doing well and he does activities on the computer without difficulty.      Smoking Cessation:  We discussed smoking cessation.  He almost quit with Wellbutrin years ago but was not motivated enough to do so.   He is trying to cut back a lot and now smokes 16-18 a day instead of 25-30.   We discussed trying to reduce to just a few a day or quit.   He prefers not to try another medication     Other:   He is unable to work. 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 He had MRIs of the brain and spine. Findings were consistent with multiple sclerosis. He was referred to me and I placed him on Tecfidera.  Initially, he had GI symptoms and diarrhea from Tecfidera but this is much better now.    He gets frequent CBC's due to his hematologic disorder and lymphocyte count is usually about 1.0.     REVIEW OF SYSTEMS:  Constitutional: No fevers, chills, sweats, or change in appetite.   He snores and has some insomnia.  He has heat intolerance Eyes: He has notes some visual blurring.  Eyes itch.    He denies double vision, eye pain Ear, nose and throat: No hearing loss, ear pain, nasal congestion, sore throat Cardiovascular: No chest pain, palpitations Respiratory:  He notes cough and occ  SOB GastrointestinaI: No nausea, vomiting, diarrhea, abdominal pain, fecal incontinence Genitourinary:  as above Musculoskeletal:  No neck pain, some  back pain Integumentary: No rash, pruritus, skin lesions Neurological: as above.  And has noted RLS Psychiatric: He notes mild depression at this time.  No anxiety Endocrine: No palpitations, diaphoresis, change in appetite, change in weight or increased thirst Hematologic/Lymphatic:  No anemia, purpura, petechiae. Allergic/Immunologic: No itchy/runny eyes, nasal congestion, recent allergic reactions, rashes  ALLERGIES: No Known Allergies  HOME MEDICATIONS: Outpatient Prescriptions Prior to Visit  Medication Sig Dispense Refill  . AMPYRA 10 MG TB12 Take 1 tablet (10 mg total) by mouth 2 (two) times daily. 60 tablet 11  . aspirin 81 MG tablet Take 81 mg by mouth daily.    . baclofen (LIORESAL) 10 MG tablet Take 1 tablet (10 mg total) by mouth as needed for muscle spasms (pt take 1-4 per day for muscle relaxant). 120 tablet 6  . clotrimazole (LOTRIMIN) 1 % cream Apply 1 application topically 2 (two) times daily.    . folic acid (FOLVITE) Q000111Q MCG tablet Take 800 mcg by mouth daily. Indicated for macrocytosis per pt    . omeprazole (PRILOSEC OTC) 20 MG tablet Take 20 mg by mouth daily.    Marland Kitchen oxyCODONE (OXY IR/ROXICODONE) 5 MG immediate release tablet Take 1 tablet (5 mg total) by mouth every 6 (six) hours as needed for severe pain. 120 tablet 0  . TECFIDERA 240 MG CPDR Take 1 capsule (240 mg total) by mouth 2 (two) times daily. 60 capsule 11  . vitamin B-12 (CYANOCOBALAMIN) 1000 MCG tablet Take 5,000 mcg by mouth daily. Indicated for macrocytosis per pt    . Vitamin D, Ergocalciferol, (DRISDOL) 50000 UNITS CAPS capsule Take 5,000 Units by mouth daily. Indicated for MS supplement    . buPROPion (WELLBUTRIN XL) 300 MG 24 hr tablet Take 1 tablet (300 mg total) by mouth daily. (Patient not taking: Reported on 07/12/2015) 30 tablet 5  . LORazepam (ATIVAN) 1 MG tablet Take 1 tablet (1 mg total) by mouth daily as needed for anxiety (before phlebotomy). (Patient not taking: Reported on 11/12/2014) 10  tablet 0  . oxybutynin (DITROPAN) 5 MG tablet Take 1 tablet (5 mg total) by mouth 3 (three) times daily. (Patient not taking: Reported on 11/12/2014) 90 tablet 11  . PATADAY 0.2 % SOLN Reported on 07/12/2015  5  . tolterodine (DETROL LA) 4 MG 24 hr capsule Take 4 mg by mouth daily. Reported on 07/12/2015  0   No facility-administered medications prior to visit.    PAST MEDICAL HISTORY: Past Medical History  Diagnosis  Date  . Neuromuscular disorder ([place])     MS  . Multiple sclerosis ([place])   . 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 Anxiety 12/22/2013  . Hearing loss   . Movement disorder   . Vision abnormalities     PAST SURGICAL HISTORY: Past Surgical History  Procedure Laterality Date  . Bunionectomy      FAMILY HISTORY: Family History  Problem Relation Age of Onset  . Cancer Maternal Grandmother     pancreatic ca  . Dementia Mother   . Diabetes type II Father   . Hypertension Father     SOCIAL HISTORY:  Social History   Social History  . Marital Status: Married    Spouse Name: N/A  . Number of Children: N/A  . Years of Education: N/A   Occupational History  . Not on file.   Social History Main Topics  . Smoking status: Current Every Day Smoker -- 0.75 packs/day for 35 years    Types: Cigarettes  . Smokeless tobacco: Never Used  . Alcohol Use: 0.0 oz/week    0 Standard drinks or equivalent per week     Comment: 3 to 6 oz. of Bourbon daily, and an occasional beer./fim  . Drug Use: No  . Sexual Activity: Not on file   Other Topics Concern  . Not on file   Social History Narrative     PHYSICAL EXAM  Filed Vitals:   07/12/15 1132  BP: 154/94  Pulse: 55  Height: 5' 9.5" (1.765 m)  Weight: 206 lb (93.441 kg)    Body mass index is 29.99 kg/(m^2).   General: The patient is well-developed and well-nourished and in no acute distress  Neurologic Exam  Mental status: The patient is alert and oriented x 3 at the time of the examination. The patient  has apparent normal recent and remote memory, with an apparently normal attention span and concentration ability.   Speech is normal.  Cranial nerves: Extraocular movements are full.   Facial symmetry is present. There is good facial sensation to soft touch bilaterally.  Facial strength is normal.  Trapezius and sternocleidomastoid strength is normal. No dysarthria is noted.  The tongue is midline, and the patient has symmetric elevation of the soft palate. No obvious hearing deficits are noted.  Motor:  Muscle bulk is normal and tone is increased in legs, left > right. Strength is  5 / 5 in arms but 4+/5 in right hip flexors and ankle extensors.   4-/5 in left hip flexors,  4/5  Ankle/toe extensors and 4+/5 elsewhere in left leg.     Sensory: Sensory testing is intact to pinprick, soft touch, vibration sensation, and position sense on all 4 extremities.  Coordination: Cerebellar testing reveals good finger-nose-finger bilaterally.  Gait and station: Station is stable but gait shows left foot drop and some spasticity.   He can not tandem. Romberg is negative.   Reflexes: Deep tendon reflexes are increase in legs, left more than right and normal . Plantar responses are normal.    DIAGNOSTIC DATA (LABS, IMAGING, TESTING) - I reviewed patient records, labs, notes, testing and imaging myself where available.  Lab Results  Component Value Date   WBC 9.6 03/14/2015   HGB 17.8* 04/20/2014   HCT 51.1* 03/14/2015   MCV 104* 03/14/2015   PLT 166 03/14/2015      ASSESSMENT AND PLAN  Multiple sclerosis (HCC)  Spastic gait  Urinary urgency  Left foot drop  Anxiety   1. Continue Tecfidera --  CBC with diff to r/o lymphopenia.        2.  Ampyra for foot drop and gait     AFO if foot drop worsens.   3. Continue other medications 4.   Stay active and exercise as tolerated.   Continue to try to reduce tobacco He will return to see me in 4-5  months or call sooner if he has new or  worsening neurologic symptoms.  Shir Bergman A. Felecia Shelling, MD, PhD [id number], [id number] AM Certified in Neurology, Clinical Neurophysiology, Sleep Medicine, Pain Medicine and Neuroimaging  [facility] [address], [place], [place] [address] [phone] *

## 2015-07-13 LAB — CBC WITH DIFFERENTIAL/PLATELET
Basophils Absolute: 0 10*3/uL (ref 0.0–0.2)
Basos: 0 %
EOS (ABSOLUTE): 0.1 10*3/uL (ref 0.0–0.4)
Eos: 1 %
HEMATOCRIT: 52.7 % — AB (ref 37.5–51.0)
Hemoglobin: 18.5 g/dL — ABNORMAL HIGH (ref 12.6–17.7)
IMMATURE GRANULOCYTES: 1 %
Immature Grans (Abs): 0.1 10*3/uL (ref 0.0–0.1)
LYMPHS ABS: 1.3 10*3/uL (ref 0.7–3.1)
Lymphs: 15 %
MCH: 36.4 pg — ABNORMAL HIGH (ref 26.6–33.0)
MCHC: 35.1 g/dL (ref 31.5–35.7)
MCV: 104 fL — AB (ref 79–97)
MONOS ABS: 0.7 10*3/uL (ref 0.1–0.9)
Monocytes: 8 %
NEUTROS PCT: 75 %
Neutrophils Absolute: 6.1 10*3/uL (ref 1.4–7.0)
PLATELETS: 146 10*3/uL — AB (ref 150–379)
RBC: 5.08 x10E6/uL (ref 4.14–5.80)
RDW: 13.4 % (ref 12.3–15.4)
WBC: 8.2 10*3/uL (ref 3.4–10.8)

## 2015-07-15 ENCOUNTER — Telehealth: Payer: Self-pay | Admitting: *Deleted

## 2015-07-15 NOTE — Telephone Encounter (Signed)
-----   Message from Britt Bottom, MD sent at 07/15/2015  1:30 PM EDT ----- Labs are okay. He has elevated hemoglobin but that has been a chronic problem for him.

## 2015-07-15 NOTE — Telephone Encounter (Signed)
LMOM (identified vm) that per RAS, labs are ok.  Hgb is some up, but this has been up in the past--is a chronic problem for him.  He does not need to return this call unless he has questions/fim

## 2015-08-12 ENCOUNTER — Telehealth: Payer: Self-pay | Admitting: Neurology

## 2015-08-12 DIAGNOSIS — G35 Multiple sclerosis: Secondary | ICD-10-CM

## 2015-08-12 MED ORDER — TECFIDERA 240 MG PO CPDR: 240.0000 mg | DELAYED_RELEASE_CAPSULE | Freq: Two times a day (BID) | ORAL | Status: DC

## 2015-08-12 MED ORDER — OXYCODONE HCL 5 MG PO TABS: 5.0000 mg | ORAL_TABLET | Freq: Four times a day (QID) | ORAL | Status: DC | PRN

## 2015-08-12 NOTE — Telephone Encounter (Signed)
Oxycodone rx. up front [facility]/fim 

## 2015-08-12 NOTE — Telephone Encounter (Addendum)
Patient called to request refill of TECFIDERA 240 MG CPDR, states he is on [employer] support program (financial assistance), [facility]ery[facility] [phone] dispenses and requires nurse or Dr. Felecia Shelling to call in this Rx every 3 months. Also request refill of oxyCODONE (OXY IR/ROXICODONE) 5 MG immediate release tablet.

## 2015-08-12 NOTE — Telephone Encounter (Signed)
Error

## 2015-08-12 NOTE — Telephone Encounter (Signed)
Tecfidera escribed to [facility]. Oxycodone rx. awaiting RAS sig/fim

## 2015-09-16 ENCOUNTER — Telehealth: Payer: Self-pay | Admitting: Neurology

## 2015-09-16 DIAGNOSIS — G35 Multiple sclerosis: Secondary | ICD-10-CM

## 2015-09-16 MED ORDER — OXYCODONE HCL 5 MG PO TABS: 5.0000 mg | ORAL_TABLET | Freq: Four times a day (QID) | ORAL | 0 refills | Status: DC | PRN

## 2015-09-16 NOTE — Addendum Note (Signed)
Addended by: France Ravens I on: 09/16/2015 01:06 PM   Modules accepted: Orders

## 2015-09-16 NOTE — Telephone Encounter (Signed)
Oxycodone rx. up front [facility]/fim 

## 2015-09-16 NOTE — Telephone Encounter (Signed)
Rx. awaiting RAS sig/fim 

## 2015-09-16 NOTE — Telephone Encounter (Signed)
Patient requesting refill of   oxyCODONE (OXY IR/ROXICODONE) 5 MG immediate release tablet  baclofen (LIORESAL) 10 MG tablet- previous rx has expired.   Pharmacy: pick up ,  [facility], [phone]

## 2015-10-11 DIAGNOSIS — I10 Essential (primary) hypertension: Secondary | ICD-10-CM | POA: Diagnosis not present

## 2015-10-11 DIAGNOSIS — Z0100 Encounter for examination of eyes and vision without abnormal findings: Secondary | ICD-10-CM | POA: Diagnosis not present

## 2015-10-17 ENCOUNTER — Telehealth: Payer: Self-pay | Admitting: Neurology

## 2015-10-17 DIAGNOSIS — G894 Chronic pain syndrome: Secondary | ICD-10-CM

## 2015-10-17 DIAGNOSIS — G35 Multiple sclerosis: Secondary | ICD-10-CM

## 2015-10-17 MED ORDER — OXYCODONE HCL 5 MG PO TABS: 5.0000 mg | ORAL_TABLET | Freq: Four times a day (QID) | ORAL | 0 refills | Status: DC | PRN

## 2015-10-17 NOTE — Telephone Encounter (Signed)
Rx. awaiting RAS sig.  UDS ordered./fim

## 2015-10-17 NOTE — Telephone Encounter (Signed)
Patient requesting refill of oxyCODONE (OXY IR/ROXICODONE) 5 MG immediate release tablet Pharmacy: pick up Monday

## 2015-10-21 ENCOUNTER — Other Ambulatory Visit (INDEPENDENT_AMBULATORY_CARE_PROVIDER_SITE_OTHER): Payer: Self-pay

## 2015-10-21 ENCOUNTER — Other Ambulatory Visit: Payer: Self-pay | Admitting: *Deleted

## 2015-10-21 DIAGNOSIS — G894 Chronic pain syndrome: Secondary | ICD-10-CM

## 2015-10-21 DIAGNOSIS — Z0289 Encounter for other administrative examinations: Secondary | ICD-10-CM

## 2015-10-22 LAB — URINE DRUG PANEL 7
Amphetamines, Urine: NEGATIVE ng/mL
BENZODIAZEPINE QUANT UR: NEGATIVE ng/mL
Barbiturate Quant, Ur: NEGATIVE ng/mL
CANNABINOID QUANT UR: NEGATIVE ng/mL
Cocaine (Metab.): NEGATIVE ng/mL
OPIATE QUANT UR: NEGATIVE ng/mL
PCP QUANT UR: NEGATIVE ng/mL

## 2015-11-01 LAB — SPECIMEN STATUS REPORT

## 2015-11-01 LAB — OXYCODONE/OXYMORPHONE SCR, UR: Oxycodone+Oxymorphone Ur Ql Scn: POSITIVE ng/mL — AB

## 2015-11-19 ENCOUNTER — Telehealth: Payer: Self-pay | Admitting: Neurology

## 2015-11-19 NOTE — Telephone Encounter (Signed)
Patient called requesting to speak to nurse Faith, needs information in reference to the last time he was here to pick up Oxycodone Rx, he "was asked to give urine sample and he wants to know if this was requested by Dr. Felecia Shelling? If so, okay but if government BS, I want to know, I got a $30 bill from [place] and he paid it, politicians no more than doctor's or think they do".

## 2015-11-19 NOTE — Telephone Encounter (Signed)
I have spoken with Vincent Evans this morning and explained that regular random drug screening will be performed, in order to comply with responsible rx'ing practices for opiates and other controlled substances.  We are very sorry for the inconvenience, but due to epidemic abuse of these meds, and rec. made by the state, med. board, random drug screening is necessary.  He verbalized understanding of same/fim

## 2015-11-26 ENCOUNTER — Telehealth: Payer: Self-pay | Admitting: Neurology

## 2015-11-26 DIAGNOSIS — G35 Multiple sclerosis: Secondary | ICD-10-CM

## 2015-11-26 MED ORDER — OXYCODONE HCL 5 MG PO TABS: 5.0000 mg | ORAL_TABLET | Freq: Four times a day (QID) | ORAL | 0 refills | Status: DC | PRN

## 2015-11-26 NOTE — Telephone Encounter (Signed)
Pt request refill for oxyCODONE (OXY IR/ROXICODONE) 5 MG immediate release tablet . °

## 2015-11-26 NOTE — Telephone Encounter (Signed)
Rx. awaiting RAS sig/fim 

## 2015-11-26 NOTE — Telephone Encounter (Signed)
Oxycodone rx. up front [facility]/fim 

## 2015-11-28 ENCOUNTER — Other Ambulatory Visit: Payer: Self-pay | Admitting: Family Medicine

## 2015-11-28 DIAGNOSIS — L570 Actinic keratosis: Secondary | ICD-10-CM | POA: Diagnosis not present

## 2015-11-28 DIAGNOSIS — Z0000 Encounter for general adult medical examination without abnormal findings: Secondary | ICD-10-CM

## 2015-11-28 DIAGNOSIS — E782 Mixed hyperlipidemia: Secondary | ICD-10-CM | POA: Diagnosis not present

## 2015-11-28 DIAGNOSIS — Z136 Encounter for screening for cardiovascular disorders: Secondary | ICD-10-CM | POA: Diagnosis not present

## 2015-11-28 DIAGNOSIS — Z131 Encounter for screening for diabetes mellitus: Secondary | ICD-10-CM | POA: Diagnosis not present

## 2015-11-28 DIAGNOSIS — Z1159 Encounter for screening for other viral diseases: Secondary | ICD-10-CM | POA: Diagnosis not present

## 2015-11-28 DIAGNOSIS — Z125 Encounter for screening for malignant neoplasm of prostate: Secondary | ICD-10-CM | POA: Diagnosis not present

## 2015-11-28 DIAGNOSIS — R9431 Abnormal electrocardiogram [ECG] [EKG]: Secondary | ICD-10-CM | POA: Diagnosis not present

## 2015-11-28 DIAGNOSIS — F17200 Nicotine dependence, unspecified, uncomplicated: Secondary | ICD-10-CM

## 2015-11-28 DIAGNOSIS — M810 Age-related osteoporosis without current pathological fracture: Secondary | ICD-10-CM | POA: Diagnosis not present

## 2015-11-28 DIAGNOSIS — R350 Frequency of micturition: Secondary | ICD-10-CM | POA: Diagnosis not present

## 2015-12-04 DIAGNOSIS — I739 Peripheral vascular disease, unspecified: Secondary | ICD-10-CM | POA: Diagnosis not present

## 2015-12-04 DIAGNOSIS — I459 Conduction disorder, unspecified: Secondary | ICD-10-CM | POA: Diagnosis not present

## 2015-12-04 DIAGNOSIS — Z7289 Other problems related to lifestyle: Secondary | ICD-10-CM | POA: Diagnosis not present

## 2015-12-04 DIAGNOSIS — R9431 Abnormal electrocardiogram [ECG] [EKG]: Secondary | ICD-10-CM | POA: Diagnosis not present

## 2015-12-05 DIAGNOSIS — D485 Neoplasm of uncertain behavior of skin: Secondary | ICD-10-CM | POA: Diagnosis not present

## 2015-12-05 DIAGNOSIS — L821 Other seborrheic keratosis: Secondary | ICD-10-CM | POA: Diagnosis not present

## 2015-12-05 DIAGNOSIS — D2339 Other benign neoplasm of skin of other parts of face: Secondary | ICD-10-CM | POA: Diagnosis not present

## 2015-12-05 DIAGNOSIS — L570 Actinic keratosis: Secondary | ICD-10-CM | POA: Diagnosis not present

## 2015-12-05 DIAGNOSIS — L918 Other hypertrophic disorders of the skin: Secondary | ICD-10-CM | POA: Diagnosis not present

## 2015-12-09 ENCOUNTER — Encounter: Payer: Self-pay | Admitting: Neurology

## 2015-12-09 ENCOUNTER — Ambulatory Visit (INDEPENDENT_AMBULATORY_CARE_PROVIDER_SITE_OTHER): Payer: Medicare HMO | Admitting: Neurology

## 2015-12-09 VITALS — BP 130/90 | Resp 20 | Ht 69.5 in | Wt 209.5 lb

## 2015-12-09 DIAGNOSIS — G35 Multiple sclerosis: Secondary | ICD-10-CM

## 2015-12-09 DIAGNOSIS — R261 Paralytic gait: Secondary | ICD-10-CM

## 2015-12-09 DIAGNOSIS — M21372 Foot drop, left foot: Secondary | ICD-10-CM

## 2015-12-09 DIAGNOSIS — R3915 Urgency of urination: Secondary | ICD-10-CM

## 2015-12-09 DIAGNOSIS — D751 Secondary polycythemia: Secondary | ICD-10-CM | POA: Diagnosis not present

## 2015-12-09 DIAGNOSIS — Z716 Tobacco abuse counseling: Secondary | ICD-10-CM | POA: Diagnosis not present

## 2015-12-09 MED ORDER — OXYCODONE HCL 5 MG PO TABS: 5.0000 mg | ORAL_TABLET | Freq: Four times a day (QID) | ORAL | 0 refills | Status: DC | PRN

## 2015-12-09 NOTE — Progress Notes (Signed)
[facility]  PATIENT: Dawn Avila DOB: 1950/08/16  REFERRING CLINICIAN: Melinda Crutch HISTORY FROM: Patient  REASON FOR VISIT: Multiple Sclerosis   HISTORICAL  CHIEF COMPLAINT:  Chief Complaint  Patient presents with  . Multiple Sclerosis    Sts. he continues to tolerate Tecfidera well.  Sts. he is also compliant with Ampyra--is having some other problems with his legs and has a lower exremity doppler sched. to r/o a blockage in a left leg vessel.  Also has a Thallium stress test scheduled.  He is trying to quit smoking--occasionally using a vape to help with Nicotine cravings/fim    HISTORY OF PRESENT ILLNESS:  Mr. Knupp is a 65 year old man  with multiple sclerosis.   He felt more fatigued outside over the summer.   He feels gait is his main problem and Ampyra has helped a little bit.    He is on Tecfidera but has had difficulty with the patient assistance program.    He tolerates it ok -- no GI issues and occasional flushing.  Cardiac:   He had an EKG that was abnormal and was referred to Dr. Einar Gip who is ordering a stress thallium and ECHO   I asked Mr. Campillo to have them forward results.    Gait/strength/sensation:    He has weakness in the left leg and mild weakness in the right leg causing his gait to be disturbed. He has a left foot drop. He also has spasticity helped by baclofen.  Ampyra is helping his gait a lot.  He noticed a difference when he was off for a week.   He uses a cane.   He denies numbness in the legs.     Pain:   He has right > left buttock pain that shoots from his back down the right leg at times.   Oxycodone and baclofen helps the pain.     He notes his pain in the legs is worse when he lays down at night, especially down the right leg. He notes more spasticity at night also.    He does not have side effects with the baclofen.  Bladder:   He reports bladder urgency and frequency and has had some incontinence. Oxybutynin and Detrol have only  helped mildly.  He has 2 x nocturia most nights.    Vision:  He notes no MS related vision problems.  No diplopia or major acuity issues.   He has readers and other glasses for driving.     Fatigue/Sleep:   He has fatigue everyday.  His fatigue is more mental than physical. He is trying to be more active and has joined a gym.  Fatigue is worse the past year.   He sleeps fairly well most nights.  He will wake up to use the bathroom but falls asleep easily.  He naps most days and feels much better afterwards.   He snores and has had some snorts at night.   He does not want to get a PSG and would not wear CPAP or an oral appliance.    Mood/cognition   Mood is doing well with less no significant depression .   He has less apathy.   He feels his cognition is doing well and he does activities on the computer without difficulty.      Smoking Cessation:  He just quit smoking.   He is a little more irritable but is ok otherwise.   He is using a nicotine inhaler a few times a  day to help quit smoking.      Other:   He is unable to work. 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   MS History:  Around 2013, he  presented with left leg weakness and gait spasticity and clumsiness. He had MRIs of the brain and spine. Findings were consistent with multiple sclerosis. He was referred to me and I placed him on Tecfidera.  Initially, he had GI symptoms and diarrhea from Tecfidera but this is much better now.    He gets frequent CBC's due to his hematologic disorder and lymphocyte count is usually about 1.0.     REVIEW OF SYSTEMS:  Constitutional: No fevers, chills, sweats, or change in appetite.   He snores and has some insomnia.  He has heat intolerance Eyes: He has notes some visual blurring.  Eyes itch.    He denies double vision, eye pain Ear, nose and throat: No hearing loss, ear pain, nasal congestion, sore throat Cardiovascular:  No chest pain, palpitations Respiratory:  He notes cough and occ  SOB GastrointestinaI: No nausea, vomiting, diarrhea, abdominal pain, fecal incontinence Genitourinary:  as above Musculoskeletal:  No neck pain, some back pain Integumentary: No rash, pruritus, skin lesions Neurological: as above.  And has noted RLS Psychiatric: He notes mild depression at this time.  No anxiety Endocrine: No palpitations, diaphoresis, change in appetite, change in weight or increased thirst Hematologic/Lymphatic:  No anemia, purpura, petechiae. Allergic/Immunologic: No itchy/runny eyes, nasal congestion, recent allergic reactions, rashes  ALLERGIES: No Known Allergies  HOME MEDICATIONS: Outpatient Medications Prior to Visit  Medication Sig Dispense Refill  . AMPYRA 10 MG TB12 Take 1 tablet (10 mg total) by mouth 2 (two) times daily. 60 tablet 11  . aspirin 81 MG tablet Take 81 mg by mouth daily.    . baclofen (LIORESAL) 10 MG tablet Take 1 tablet (10 mg total) by mouth as needed for muscle spasms (pt take 1-4 per day for muscle relaxant). 120 tablet 11  . cholecalciferol (VITAMIN D) 1000 units tablet Take 5,000 Units by mouth daily.    . folic acid (FOLVITE) Q000111Q MCG tablet Take 800 mcg by mouth daily. Indicated for macrocytosis per pt    . omeprazole (PRILOSEC OTC) 20 MG tablet Take 20 mg by mouth daily.    . TECFIDERA 240 MG CPDR Take 1 capsule (240 mg total) by mouth 2 (two) times daily. 60 capsule 11  . oxyCODONE (OXY IR/ROXICODONE) 5 MG immediate release tablet Take 1 tablet (5 mg total) by mouth every 6 (six) hours as needed for severe pain. 120 tablet 0  . clotrimazole (LOTRIMIN) 1 % cream Apply 1 application topically 2 (two) times daily.    . vitamin B-12 (CYANOCOBALAMIN) 1000 MCG tablet Take 5,000 mcg by mouth daily. Indicated for macrocytosis per pt     No facility-administered medications prior to visit.     PAST MEDICAL HISTORY: Past Medical History:  Diagnosis Date  . Anxiety 12/22/2013   . Hearing loss   . Movement disorder   . Multiple sclerosis ([place])   . Neuromuscular disorder ([place])    MS  . 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 Vision abnormalities     PAST SURGICAL HISTORY: Past Surgical History:  Procedure Laterality Date  . BUNIONECTOMY      FAMILY HISTORY: Family History  Problem Relation Age of Onset  . Cancer Maternal Grandmother     pancreatic ca  .  Dementia Mother   . Diabetes type II Father   . Hypertension Father     SOCIAL HISTORY:  Social History   Social History  . Marital status: Married    Spouse name: N/A  . Number of children: N/A  . Years of education: N/A   Occupational History  . Not on file.   Social History Main Topics  . Smoking status: Current Every Day Smoker    Packs/day: 0.75    Years: 35.00    Types: Cigarettes  . Smokeless tobacco: Never Used  . Alcohol use 0.0 oz/week     Comment: 3 to 6 oz. of Bourbon daily, and an occasional beer./fim  . Drug use: No  . Sexual activity: Not on file   Other Topics Concern  . Not on file   Social History Narrative  . No narrative on file     PHYSICAL EXAM  Vitals:   12/09/15 1331  BP: 130/90  Resp: 20  Weight: 209 lb 8 oz (95 kg)  Height: 5' 9.5" (1.765 m)    Body mass index is 30.49 kg/m.   General: The patient is well-developed and well-nourished and in no acute distress  Neurologic Exam  Mental status: The patient is alert and oriented x 3 at the time of the examination. The patient has apparent normal recent and remote memory, with an apparently normal attention span and concentration ability.   Speech is normal.  Cranial nerves: Extraocular movements are full.   Facial symmetry is present. There is good facial sensation to soft touch bilaterally.  Facial strength is normal.  Trapezius and sternocleidomastoid strength is normal. No dysarthria is noted.  The tongue is midline, and the patient has symmetric elevation of the soft palate. No obvious  hearing deficits are noted.  Motor:  Muscle bulk is normal and tone is increased in legs, left > right. Strength is  5 / 5 in arms but 4+/5 in right hip flexors and ankle extensors.   4-/5 in left hip flexors,  4/5  Ankle/toe extensors and 4+/5 elsewhere in left leg.     Sensory: Sensory testing is intact to pinprick, soft touch, vibration sensation, and position sense on all 4 extremities.  Coordination: Cerebellar testing reveals good finger-nose-finger bilaterally.  Gait and station: Station is stable but gait shows left foot drop and spasticity.   He can not tandem. Romberg is negative.   Reflexes: Deep tendon reflexes are increase in legs, left more than right and normal . Plantar responses are normal.    DIAGNOSTIC DATA (LABS, IMAGING, TESTING) - I reviewed patient records, labs, notes, testing and imaging myself where available.  Lab Results  Component Value Date   WBC 8.2 07/12/2015   HGB 17.8 (H) 04/20/2014   HCT 52.7 (H) 07/12/2015   MCV 104 (H) 07/12/2015   PLT 146 (L) 07/12/2015      ASSESSMENT AND PLAN  Spastic gait  Multiple sclerosis (HCC) - Plan: CBC with Differential/Platelet, Hepatitis B core antibody, total, Hepatitis B surface antigen, Quantiferon tb gold assay (blood), Hepatitis B surface antibody, Hepatic function panel, oxyCODONE (OXY IR/ROXICODONE) 5 MG immediate release tablet  Left foot drop  Urinary urgency  Tobacco abuse counseling  Secondary erythrocytosis   1.  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   We discussed ocrelizumab.   Check CBc, LFT, Hep B labs and TB labs.,  He will give this some thought and we will discuss changing  medications after lab work is back. 2.  Ampyra helps foot drop and gait   We discussed an AFO if foot drop worsens.   3. Continue other medications 4.   Stay active and exercise as tolerated.   Continue to try to reduce tobacco 5.   Renew oxycodone. He will return to see  me in 4-5  months or call sooner if he has new or worsening neurologic symptoms.  Oluwatamilore Starnes A. Felecia Shelling, MD, PhD [id number], [id number] PM Certified in Neurology, Clinical Neurophysiology, Sleep Medicine, Pain Medicine and Neuroimaging  [facility] [address], [place], [place] [address] [phone]

## 2015-12-10 LAB — CBC WITH DIFFERENTIAL/PLATELET
BASOS ABS: 0 10*3/uL (ref 0.0–0.2)
BASOS: 0 %
EOS (ABSOLUTE): 0.1 10*3/uL (ref 0.0–0.4)
Eos: 1 %
Hematocrit: 52.7 % — ABNORMAL HIGH (ref 37.5–51.0)
Hemoglobin: 18.1 g/dL — ABNORMAL HIGH (ref 12.6–17.7)
Immature Grans (Abs): 0.1 10*3/uL (ref 0.0–0.1)
Immature Granulocytes: 1 %
LYMPHS ABS: 1.4 10*3/uL (ref 0.7–3.1)
Lymphs: 16 %
MCH: 36.5 pg — AB (ref 26.6–33.0)
MCHC: 34.3 g/dL (ref 31.5–35.7)
MCV: 106 fL — AB (ref 79–97)
MONOS ABS: 0.8 10*3/uL (ref 0.1–0.9)
Monocytes: 9 %
NEUTROS ABS: 6.3 10*3/uL (ref 1.4–7.0)
Neutrophils: 73 %
PLATELETS: 152 10*3/uL (ref 150–379)
RBC: 4.96 x10E6/uL (ref 4.14–5.80)
RDW: 13.1 % (ref 12.3–15.4)
WBC: 8.7 10*3/uL (ref 3.4–10.8)

## 2015-12-10 LAB — HEPATITIS B SURFACE ANTIGEN: Hepatitis B Surface Ag: NEGATIVE

## 2015-12-10 LAB — HEPATIC FUNCTION PANEL
ALT: 58 IU/L — AB (ref 0–44)
AST: 74 IU/L — ABNORMAL HIGH (ref 0–40)
Albumin: 4.6 g/dL (ref 3.6–4.8)
Alkaline Phosphatase: 84 IU/L (ref 39–117)
Bilirubin Total: 1.1 mg/dL (ref 0.0–1.2)
Bilirubin, Direct: 0.35 mg/dL (ref 0.00–0.40)
TOTAL PROTEIN: 7.9 g/dL (ref 6.0–8.5)

## 2015-12-10 LAB — HEPATITIS B SURFACE ANTIBODY,QUALITATIVE: Hep B Surface Ab, Qual: NONREACTIVE

## 2015-12-10 LAB — HEPATITIS B CORE ANTIBODY, TOTAL: Hep B Core Total Ab: NEGATIVE

## 2015-12-12 ENCOUNTER — Ambulatory Visit
Admission: RE | Admit: 2015-12-12 | Discharge: 2015-12-12 | Disposition: A | Discharge status: Home / Self Care | Payer: Medicare HMO | Source: Ambulatory Visit | Attending: Family Medicine | Admitting: Family Medicine

## 2015-12-12 DIAGNOSIS — F17200 Nicotine dependence, unspecified, uncomplicated: Secondary | ICD-10-CM

## 2015-12-12 DIAGNOSIS — Z0000 Encounter for general adult medical examination without abnormal findings: Secondary | ICD-10-CM

## 2015-12-12 DIAGNOSIS — Z136 Encounter for screening for cardiovascular disorders: Secondary | ICD-10-CM | POA: Diagnosis not present

## 2015-12-12 DIAGNOSIS — Z87891 Personal history of nicotine dependence: Secondary | ICD-10-CM | POA: Diagnosis not present

## 2015-12-12 LAB — QUANTIFERON IN TUBE
QFT TB AG MINUS NIL VALUE: 0 IU/mL
QUANTIFERON MITOGEN VALUE: 0.51 IU/mL
QUANTIFERON TB AG VALUE: 0.03 [IU]/mL
QUANTIFERON TB GOLD: UNDETERMINED — AB
Quantiferon Nil Value: 0.03 IU/mL

## 2015-12-12 LAB — QUANTIFERON TB GOLD ASSAY (BLOOD)

## 2015-12-19 ENCOUNTER — Telehealth: Payer: Self-pay | Admitting: *Deleted

## 2015-12-19 NOTE — Telephone Encounter (Signed)
-----   Message from Britt Bottom, MD sent at 12/13/2015  1:11 PM EDT ----- Please let him know that the TB test was indeterminate.. This is usually normal but I need to check a chest x-ray to make sure that there is nothing worrisome.   if he has decided to go in ocrelizumab. We can send in the form after his x-ray.

## 2015-12-19 NOTE — Telephone Encounter (Signed)
I have spoken with Les this afternoon, and per RAS, explained that TB Gold test was indeterminate.  He will need a CXR prior to starting Ocrelizumab.  He verbalized understanding of same, sts. he has some cardiovascular testing sched. next week, and he would like to get those results. prior to making a decision about switching MS meds.  He will call us back once he has an update/has made a decision about starting Ocrelizumab/fim

## 2015-12-20 DIAGNOSIS — R9431 Abnormal electrocardiogram [ECG] [EKG]: Secondary | ICD-10-CM | POA: Diagnosis not present

## 2015-12-20 DIAGNOSIS — I442 Atrioventricular block, complete: Secondary | ICD-10-CM | POA: Diagnosis not present

## 2015-12-20 DIAGNOSIS — I10 Essential (primary) hypertension: Secondary | ICD-10-CM | POA: Diagnosis not present

## 2015-12-26 DIAGNOSIS — I739 Peripheral vascular disease, unspecified: Secondary | ICD-10-CM | POA: Diagnosis not present

## 2015-12-26 DIAGNOSIS — I459 Conduction disorder, unspecified: Secondary | ICD-10-CM | POA: Diagnosis not present

## 2015-12-26 DIAGNOSIS — I10 Essential (primary) hypertension: Secondary | ICD-10-CM | POA: Diagnosis not present

## 2015-12-26 DIAGNOSIS — R9431 Abnormal electrocardiogram [ECG] [EKG]: Secondary | ICD-10-CM | POA: Diagnosis not present

## 2016-01-06 ENCOUNTER — Other Ambulatory Visit: Payer: Self-pay | Admitting: Cardiology

## 2016-01-06 DIAGNOSIS — Z7289 Other problems related to lifestyle: Secondary | ICD-10-CM | POA: Diagnosis not present

## 2016-01-06 DIAGNOSIS — I459 Conduction disorder, unspecified: Secondary | ICD-10-CM | POA: Diagnosis not present

## 2016-01-06 DIAGNOSIS — E782 Mixed hyperlipidemia: Secondary | ICD-10-CM | POA: Diagnosis not present

## 2016-01-06 DIAGNOSIS — I739 Peripheral vascular disease, unspecified: Secondary | ICD-10-CM | POA: Diagnosis not present

## 2016-01-06 DIAGNOSIS — K7030 Alcoholic cirrhosis of liver without ascites: Secondary | ICD-10-CM

## 2016-01-07 DIAGNOSIS — R69 Illness, unspecified: Secondary | ICD-10-CM | POA: Diagnosis not present

## 2016-01-20 ENCOUNTER — Ambulatory Visit
Admission: RE | Admit: 2016-01-20 | Discharge: 2016-01-20 | Disposition: A | Discharge status: Home / Self Care | Payer: Medicare HMO | Source: Ambulatory Visit | Attending: Cardiology | Admitting: Cardiology

## 2016-01-20 DIAGNOSIS — K7030 Alcoholic cirrhosis of liver without ascites: Secondary | ICD-10-CM

## 2016-01-21 DIAGNOSIS — R69 Illness, unspecified: Secondary | ICD-10-CM | POA: Diagnosis not present

## 2016-01-27 ENCOUNTER — Telehealth: Payer: Self-pay | Admitting: Neurology

## 2016-01-27 DIAGNOSIS — G35 Multiple sclerosis: Secondary | ICD-10-CM

## 2016-01-27 MED ORDER — OXYCODONE HCL 5 MG PO TABS: 5.0000 mg | ORAL_TABLET | Freq: Four times a day (QID) | ORAL | 0 refills | Status: DC | PRN

## 2016-01-27 NOTE — Telephone Encounter (Signed)
Rx printed, signed, up front for pick-up. 

## 2016-01-27 NOTE — Telephone Encounter (Signed)
Pt was last seen and rx was written 12/09/15. Follow-up appt is scheduled in March. Rx printed, awaiting MD review/signature.

## 2016-01-27 NOTE — Telephone Encounter (Signed)
Patient needs refill on oxycodone.

## 2016-01-28 ENCOUNTER — Telehealth: Payer: Self-pay | Admitting: Neurology

## 2016-01-28 NOTE — Telephone Encounter (Signed)
Oxycodone rx. was printed, placed up front [facility] on 01-27-16/fim

## 2016-01-28 NOTE — Telephone Encounter (Signed)
Patient requesting refill of oxyCODONE (OXY IR/ROXICODONE) 5 MG immediate release tablet. I advised the Rx will be ready in 24 hours unless the nurse advises otherwise.

## 2016-02-28 ENCOUNTER — Telehealth: Payer: Self-pay | Admitting: Neurology

## 2016-02-28 DIAGNOSIS — G35 Multiple sclerosis: Secondary | ICD-10-CM

## 2016-02-28 MED ORDER — OXYCODONE HCL 5 MG PO TABS: 5.0000 mg | ORAL_TABLET | Freq: Four times a day (QID) | ORAL | 0 refills | Status: DC | PRN

## 2016-02-28 NOTE — Addendum Note (Signed)
Addended by: France Ravens I on: 02/28/2016 12:25 PM   Modules accepted: Orders

## 2016-02-28 NOTE — Telephone Encounter (Signed)
Patient requesting refill for oxyCODONE (OXY IR/ROXICODONE) 5 MG immediate release tablet

## 2016-02-28 NOTE — Telephone Encounter (Signed)
Oxycodone rx. up front [facility]/fim 

## 2016-02-28 NOTE — Telephone Encounter (Signed)
Rx. awaiting RAS sig/fim 

## 2016-03-09 DIAGNOSIS — I739 Peripheral vascular disease, unspecified: Secondary | ICD-10-CM | POA: Diagnosis not present

## 2016-03-09 DIAGNOSIS — E782 Mixed hyperlipidemia: Secondary | ICD-10-CM | POA: Diagnosis not present

## 2016-03-09 DIAGNOSIS — Z7289 Other problems related to lifestyle: Secondary | ICD-10-CM | POA: Diagnosis not present

## 2016-03-09 DIAGNOSIS — R9431 Abnormal electrocardiogram [ECG] [EKG]: Secondary | ICD-10-CM | POA: Diagnosis not present

## 2016-04-01 ENCOUNTER — Telehealth: Payer: Self-pay | Admitting: Neurology

## 2016-04-01 DIAGNOSIS — G35 Multiple sclerosis: Secondary | ICD-10-CM

## 2016-04-01 MED ORDER — OXYCODONE HCL 5 MG PO TABS
5.0000 mg | ORAL_TABLET | Freq: Four times a day (QID) | ORAL | 0 refills | Status: DC | PRN
Start: 2016-04-01 — End: 2016-05-08

## 2016-04-01 NOTE — Telephone Encounter (Signed)
Rx. up front [facility]/fim 

## 2016-04-01 NOTE — Telephone Encounter (Signed)
Pt request refill for oxyCODONE (OXY IR/ROXICODONE) 5 MG immediate release tablet . Pt will pick up on the 2/12.

## 2016-04-01 NOTE — Telephone Encounter (Signed)
Rx. awaiting RAS sig/fim 

## 2016-04-22 DIAGNOSIS — E782 Mixed hyperlipidemia: Secondary | ICD-10-CM | POA: Diagnosis not present

## 2016-04-22 DIAGNOSIS — I739 Peripheral vascular disease, unspecified: Secondary | ICD-10-CM | POA: Diagnosis not present

## 2016-05-08 ENCOUNTER — Ambulatory Visit (INDEPENDENT_AMBULATORY_CARE_PROVIDER_SITE_OTHER): Payer: Medicare HMO | Admitting: Neurology

## 2016-05-08 ENCOUNTER — Encounter (INDEPENDENT_AMBULATORY_CARE_PROVIDER_SITE_OTHER): Payer: Self-pay

## 2016-05-08 ENCOUNTER — Encounter: Payer: Self-pay | Admitting: Neurology

## 2016-05-08 VITALS — BP 110/90 | HR 76 | Resp 18 | Ht 69.5 in | Wt 211.0 lb

## 2016-05-08 DIAGNOSIS — R3915 Urgency of urination: Secondary | ICD-10-CM

## 2016-05-08 DIAGNOSIS — M21372 Foot drop, left foot: Secondary | ICD-10-CM | POA: Diagnosis not present

## 2016-05-08 DIAGNOSIS — R261 Paralytic gait: Secondary | ICD-10-CM

## 2016-05-08 DIAGNOSIS — G35 Multiple sclerosis: Secondary | ICD-10-CM | POA: Diagnosis not present

## 2016-05-08 DIAGNOSIS — D751 Secondary polycythemia: Secondary | ICD-10-CM

## 2016-05-08 MED ORDER — OXYCODONE HCL 5 MG PO TABS: 5.0000 mg | ORAL_TABLET | Freq: Four times a day (QID) | ORAL | 0 refills | Status: DC | PRN

## 2016-05-08 MED ORDER — BACLOFEN 10 MG PO TABS: ORAL_TABLET | ORAL | 11 refills | Status: DC

## 2016-05-08 NOTE — Progress Notes (Signed)
[facility]  PATIENT: Vincent Evans DOB: 1950-11-27  REFERRING CLINICIAN: Melinda Crutch HISTORY FROM: Patient  REASON FOR VISIT: Multiple Sclerosis   HISTORICAL  CHIEF COMPLAINT:  Chief Complaint  Patient presents with  . Multiple Sclerosis    Sts. he continues to tolerate Tecfidera well.  Stopped Ampyra b/c  he didn't feel it helped.  Thinks walking is about the same. Is now on Cilostazol for PVD, and Livalo for high cholesterol./fim    HISTORY OF PRESENT ILLNESS:  Vincent Evans is a 66 year old man  with multiple sclerosis.     MS:   He is on Tecfidera and tolerates it well.   He denies any exacerbation. He has had difficulty with the patient assistance program but has had relief med's sent.    He tolerates it well with no GI issues and occasional flushing.  Gait/strength/sensation:    He reports weakness in the left leg > right leg.  He uses a cane.    He has a left foot drop. He also has spasticity helped by baclofen.  Ampyra seemed to stop helping so he stopped.   He has spasticity, worse first thing in the morning     He denies numbness in the legs.     Pain:   He has right > left buttock pain that shoots from his back down the right leg at times.   Oxycodone and baclofen helps the pain.     He notes his pain in the legs is worse when he lays down at night, especially down the right leg. He notes more spasticity at night also.    He does not have side effects with the baclofen.  Bladder:   He reports bladder urgency and frequency and has had some incontinence. Oxybutynin and Detrol have only helped mildly.  He has 2 x nocturia most nights.    Vision:  He notes no MS related vision problems.  No diplopia or major acuity issues.   He has readers and other glasses for driving.     Fatigue/Sleep:   He has fatigue that is more mental than physical. He is active and walks daily.     He goes to a gym ([employer] and the owner is a PT that has instructed him on a workout and  stretching.     He falls sleeps fairly well most nights.  He has 2 x nocturia but quickly falls asleep.  He naps most days and feels much better afterwards.   He snores and has had some snorts at night.   He does not want to get a PSG and would not wear CPAP or an oral appliance.    Mood/cognition   Mood is doing well with less no significant depression .   He has less apathy.   He feels his cognition is doing well and he does activities on the computer without difficulty.      Smoking Cessation:  He is smoking 3/4 ppd (was worse).  He is trying to reduce but prefers to go slow.   He is using a nicotine inhaler a few times a day to help quit smoking.      Other:   He is unable to work. 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   MS History:  Around 2013, he  presented with left leg weakness and gait spasticity and clumsiness. He had MRIs of the brain and  spine. Findings were consistent with multiple sclerosis. He was referred to me and I placed him on Tecfidera.  Initially, he had GI symptoms and diarrhea from Tecfidera but this is much better now.    He gets frequent CBC's due to his hematologic disorder and lymphocyte count is usually about 1.0.     REVIEW OF SYSTEMS:  Constitutional: No fevers, chills, sweats, or change in appetite.   He snores and has some insomnia.  He has heat intolerance Eyes: He has notes some visual blurring.  Eyes itch.    He denies double vision, eye pain Ear, nose and throat: No hearing loss, ear pain, nasal congestion, sore throat Cardiovascular: No chest pain, palpitations Respiratory:  He notes cough and occ  SOB GastrointestinaI: No nausea, vomiting, diarrhea, abdominal pain, fecal incontinence Genitourinary:  as above Musculoskeletal:  No neck pain, some back pain Integumentary: No rash, pruritus, skin lesions Neurological: as above.  And has noted RLS Psychiatric: He notes mild  depression at this time.  No anxiety Endocrine: No palpitations, diaphoresis, change in appetite, change in weight or increased thirst Hematologic/Lymphatic:  No anemia, purpura, petechiae. Allergic/Immunologic: No itchy/runny eyes, nasal congestion, recent allergic reactions, rashes  ALLERGIES: No Known Allergies  HOME MEDICATIONS: Outpatient Medications Prior to Visit  Medication Sig Dispense Refill  . aspirin 81 MG tablet Take 81 mg by mouth daily.    . cholecalciferol (VITAMIN D) 1000 units tablet Take 5,000 Units by mouth daily.    . folic acid (FOLVITE) 568 MCG tablet Take 800 mcg by mouth daily. Indicated for macrocytosis per pt    . omeprazole (PRILOSEC OTC) 20 MG tablet Take 20 mg by mouth daily.    . TECFIDERA 240 MG CPDR Take 1 capsule (240 mg total) by mouth 2 (two) times daily. 60 capsule 11  . baclofen (LIORESAL) 10 MG tablet Take 1 tablet (10 mg total) by mouth as needed for muscle spasms (pt take 1-4 per day for muscle relaxant). 120 tablet 11  . oxyCODONE (OXY IR/ROXICODONE) 5 MG immediate release tablet Take 1 tablet (5 mg total) by mouth every 6 (six) hours as needed for severe pain. 120 tablet 0  . AMPYRA 10 MG TB12 Take 1 tablet (10 mg total) by mouth 2 (two) times daily. (Patient not taking: Reported on 05/08/2016) 60 tablet 11  . vitamin B-12 (CYANOCOBALAMIN) 1000 MCG tablet Take 5,000 mcg by mouth daily. Indicated for macrocytosis per pt    . clotrimazole (LOTRIMIN) 1 % cream Apply 1 application topically 2 (two) times daily.     No facility-administered medications prior to visit.     PAST MEDICAL HISTORY: Past Medical History:  Diagnosis Date  . Anxiety 12/22/2013  . Hearing loss   . Movement disorder   . Multiple sclerosis ([place])   . Neuromuscular disorder ([place])    MS  . 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 Vision abnormalities     PAST SURGICAL HISTORY: Past Surgical History:  Procedure Laterality Date  . BUNIONECTOMY      FAMILY  HISTORY: Family History  Problem Relation Age of Onset  . Cancer Maternal Grandmother     pancreatic ca  . Dementia Mother   . Diabetes type II Father   . Hypertension Father     SOCIAL HISTORY:  Social History   Social History  . Marital status: Married    Spouse name: N/A  . Number of children: N/A  . Years of education: N/A   Occupational History  . Not on  file.   Social History Main Topics  . Smoking status: Current Every Day Smoker    Packs/day: 0.75    Years: 35.00    Types: Cigarettes  . Smokeless tobacco: Never Used  . Alcohol use 0.0 oz/week     Comment: 3 to 6 oz. of Bourbon daily, and an occasional beer./fim  . Drug use: No  . Sexual activity: Not on file   Other Topics Concern  . Not on file   Social History Narrative  . No narrative on file     PHYSICAL EXAM  Vitals:   05/08/16 1113  BP: 110/90  Pulse: 76  Resp: 18  Weight: 211 lb (95.7 kg)  Height: 5' 9.5" (1.765 m)    Body mass index is 30.71 kg/m.   General: The patient is well-developed and well-nourished and in no acute distress  Neurologic Exam  Mental status: The patient is alert and oriented x 3 at the time of the examination. The patient has apparent normal recent and remote memory, with an apparently normal attention span and concentration ability.   Speech is normal.  Cranial nerves: Extraocular movements are full.   Facial symmetry is present. There is good facial sensation to soft touch bilaterally.  Facial strength is normal.  Trapezius and sternocleidomastoid strength is normal. No dysarthria is noted.  The tongue is midline, and the patient has symmetric elevation of the soft palate. No obvious hearing deficits are noted.  Motor:  Muscle bulk is normal and tone is increased in legs, left > right. Strength is  5 / 5 in arms but 4+/5 in right hip flexors and ankle extensors.   4-/5 in left hip flexors,  4/5  Ankle/toe extensors and 4+/5 elsewhere in left leg.     Sensory:  Sensory testing is intact to pinprick, soft touch, vibration sensation, and position sense on all 4 extremities.  Coordination: Cerebellar testing reveals good finger-nose-finger bilaterally.  Gait and station: Station is stable but.  His gait is spastic with a left foot drop.   He can not tandem. Romberg is negative.   Reflexes: Deep tendon reflexes are increase in legs, left more than right and normal .     DIAGNOSTIC DATA (LABS, IMAGING, TESTING) - I reviewed patient records, labs, notes, testing and imaging myself where available.  Lab Results  Component Value Date   WBC 8.7 12/09/2015   HGB 17.8 (H) 04/20/2014   HCT 52.7 (H) 12/09/2015   MCV 106 (H) 12/09/2015   PLT 152 12/09/2015      ASSESSMENT AND PLAN  Multiple sclerosis ([place]) - Plan: CBC with Differential/Platelet, oxyCODONE (OXY IR/ROXICODONE) 5 MG immediate release tablet  Left foot drop  Spastic gait  Urinary urgency  Secondary erythrocytosis - Plan: baclofen (LIORESAL) 10 MG tablet   1.  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   We have discussed ocrelizumab.    He will give this more though and we will discuss changing medications after lab work is back. 2.   We discussed an AFO if foot drop worsens.   3.   Renew oxycodone. 4.   Stay active and exercise as tolerated.   Continue to try to reduce tobacco 5.    He will return to see me in 4-5  months or call sooner if he has new or worsening neurologic symptoms.  Shealeigh Dunstan A. Felecia Shelling, MD, PhD 6/83/4196, 22:29 PM Certified in Neurology, Clinical Neurophysiology, Sleep Medicine, Pain Medicine  and Neuroimaging  [facility] [address] [place], [place] [address] [phone]

## 2016-05-09 LAB — CBC WITH DIFFERENTIAL/PLATELET
BASOS ABS: 0 10*3/uL (ref 0.0–0.2)
Basos: 0 %
EOS (ABSOLUTE): 0.1 10*3/uL (ref 0.0–0.4)
Eos: 1 %
HEMATOCRIT: 47 % (ref 37.5–51.0)
HEMOGLOBIN: 16.3 g/dL (ref 13.0–17.7)
Immature Grans (Abs): 0.1 10*3/uL (ref 0.0–0.1)
Immature Granulocytes: 1 %
LYMPHS ABS: 1.5 10*3/uL (ref 0.7–3.1)
Lymphs: 14 %
MCH: 35.3 pg — ABNORMAL HIGH (ref 26.6–33.0)
MCHC: 34.7 g/dL (ref 31.5–35.7)
MCV: 102 fL — AB (ref 79–97)
MONOCYTES: 7 %
MONOS ABS: 0.8 10*3/uL (ref 0.1–0.9)
NEUTROS ABS: 8.4 10*3/uL — AB (ref 1.4–7.0)
Neutrophils: 77 %
Platelets: 185 10*3/uL (ref 150–379)
RBC: 4.62 x10E6/uL (ref 4.14–5.80)
RDW: 13 % (ref 12.3–15.4)
WBC: 10.9 10*3/uL — AB (ref 3.4–10.8)

## 2016-05-11 ENCOUNTER — Telehealth: Payer: Self-pay | Admitting: *Deleted

## 2016-05-11 NOTE — Telephone Encounter (Signed)
I have spoken with Vincent Evans this morning and per RAS, explained labs done in our office are ok; he should continue Tecfidera/fim

## 2016-05-11 NOTE — Telephone Encounter (Signed)
-----   Message from Britt Bottom, MD sent at 05/09/2016  4:24 PM EDT ----- Please let the patient know that the lab work shows good lymphocyte count

## 2016-05-19 MED ORDER — TECFIDERA 240 MG PO CPDR: 240.0000 mg | DELAYED_RELEASE_CAPSULE | Freq: Two times a day (BID) | ORAL | 3 refills | Status: DC

## 2016-05-19 NOTE — Telephone Encounter (Signed)
Pt calling to provide a fax# for [facility]  They are needing an order from Dr Felecia Shelling for the [facility] [address], the fax # it needs to go to is [phone]. Pt said the phone# for [facility] is [phone]. Pt asking that this be done asap, he only has 5 days left of the Rx.

## 2016-05-19 NOTE — Telephone Encounter (Signed)
Tecfidera rx. escribed to [facility] as requested/fim

## 2016-05-19 NOTE — Addendum Note (Signed)
Addended by: France Ravens I on: 05/19/2016 03:37 PM   Modules accepted: Orders

## 2016-05-28 ENCOUNTER — Encounter: Payer: Self-pay | Admitting: *Deleted

## 2016-06-11 DIAGNOSIS — I459 Conduction disorder, unspecified: Secondary | ICD-10-CM | POA: Diagnosis not present

## 2016-06-11 DIAGNOSIS — Z7289 Other problems related to lifestyle: Secondary | ICD-10-CM | POA: Diagnosis not present

## 2016-06-11 DIAGNOSIS — R9431 Abnormal electrocardiogram [ECG] [EKG]: Secondary | ICD-10-CM | POA: Diagnosis not present

## 2016-06-11 DIAGNOSIS — I739 Peripheral vascular disease, unspecified: Secondary | ICD-10-CM | POA: Diagnosis not present

## 2016-06-16 ENCOUNTER — Telehealth: Payer: Self-pay | Admitting: Neurology

## 2016-06-16 DIAGNOSIS — G35 Multiple sclerosis: Secondary | ICD-10-CM

## 2016-06-16 MED ORDER — OXYCODONE HCL 5 MG PO TABS: 5.0000 mg | ORAL_TABLET | Freq: Four times a day (QID) | ORAL | 0 refills | Status: DC | PRN

## 2016-06-16 NOTE — Telephone Encounter (Signed)
Pt calling for refill of oxyCODONE (OXY IR/ROXICODONE) 5 MG immediate release tablet

## 2016-06-16 NOTE — Telephone Encounter (Signed)
Rx. up front [facility]/fim 

## 2016-06-16 NOTE — Telephone Encounter (Signed)
Rx. awaiting YY sig, as RAS is ooo this week/fim

## 2016-06-16 NOTE — Addendum Note (Signed)
Addended by: France Ravens I on: 06/16/2016 10:55 AM   Modules accepted: Orders

## 2016-06-18 DIAGNOSIS — D485 Neoplasm of uncertain behavior of skin: Secondary | ICD-10-CM | POA: Diagnosis not present

## 2016-06-18 DIAGNOSIS — C44629 Squamous cell carcinoma of skin of left upper limb, including shoulder: Secondary | ICD-10-CM | POA: Diagnosis not present

## 2016-06-29 DIAGNOSIS — R220 Localized swelling, mass and lump, head: Secondary | ICD-10-CM | POA: Diagnosis not present

## 2016-07-16 ENCOUNTER — Telehealth: Payer: Self-pay | Admitting: Neurology

## 2016-07-16 DIAGNOSIS — G35 Multiple sclerosis: Secondary | ICD-10-CM

## 2016-07-16 MED ORDER — OXYCODONE HCL 5 MG PO TABS
5.0000 mg | ORAL_TABLET | Freq: Four times a day (QID) | ORAL | 0 refills | Status: DC | PRN
Start: 2016-07-16 — End: 2016-08-19

## 2016-07-16 NOTE — Addendum Note (Signed)
Addended by: France Ravens I on: 07/16/2016 02:55 PM   Modules accepted: Orders

## 2016-07-16 NOTE — Telephone Encounter (Signed)
Patient called office requesting refill request for oxyCODONE (OXY IR/ROXICODONE) 5 MG immediate release tablet. Patient advised office closes at noon on Friday.

## 2016-07-16 NOTE — Telephone Encounter (Signed)
Rx. up front [facility]/fim 

## 2016-07-16 NOTE — Telephone Encounter (Signed)
Rx. awaiting RAS sig/fim 

## 2016-08-19 ENCOUNTER — Telehealth: Payer: Self-pay | Admitting: Neurology

## 2016-08-19 DIAGNOSIS — G35 Multiple sclerosis: Secondary | ICD-10-CM

## 2016-08-19 MED ORDER — OXYCODONE HCL 5 MG PO TABS
5.0000 mg | ORAL_TABLET | Freq: Four times a day (QID) | ORAL | 0 refills | Status: DC | PRN
Start: 2016-08-19 — End: 2016-08-31

## 2016-08-19 NOTE — Telephone Encounter (Signed)
Rx. awaiting RAS sig/fim 

## 2016-08-19 NOTE — Telephone Encounter (Signed)
Rx. up front [facility]/fim 

## 2016-08-19 NOTE — Telephone Encounter (Signed)
Patient requesting refill of oxyCODONE (OXY IR/ROXICODONE) 5 MG immediate release tablet. ° °

## 2016-08-19 NOTE — Addendum Note (Signed)
Addended by: France Ravens I on: 08/19/2016 10:41 AM   Modules accepted: Orders

## 2016-08-20 ENCOUNTER — Telehealth: Payer: Self-pay | Admitting: Neurology

## 2016-08-20 DIAGNOSIS — D751 Secondary polycythemia: Secondary | ICD-10-CM

## 2016-08-20 MED ORDER — BACLOFEN 10 MG PO TABS: ORAL_TABLET | ORAL | 3 refills | Status: DC

## 2016-08-20 NOTE — Telephone Encounter (Signed)
Pt calling for refill of baclofen (LIORESAL) 10 MG tablet, please call in to   [place], [place] [phone] (Phone) [phone] (Fax)

## 2016-08-20 NOTE — Telephone Encounter (Signed)
Baclofen escribed to [facility] as requested/fim

## 2016-08-20 NOTE — Addendum Note (Signed)
Addended by: France Ravens I on: 08/20/2016 09:57 AM   Modules accepted: Orders

## 2016-08-31 ENCOUNTER — Ambulatory Visit (INDEPENDENT_AMBULATORY_CARE_PROVIDER_SITE_OTHER): Payer: Medicare HMO | Admitting: Neurology

## 2016-08-31 ENCOUNTER — Encounter (INDEPENDENT_AMBULATORY_CARE_PROVIDER_SITE_OTHER): Payer: Self-pay

## 2016-08-31 ENCOUNTER — Telehealth: Payer: Self-pay | Admitting: *Deleted

## 2016-08-31 ENCOUNTER — Encounter: Payer: Self-pay | Admitting: Neurology

## 2016-08-31 VITALS — BP 150/88 | HR 80 | Resp 16 | Ht 69.5 in | Wt 210.5 lb

## 2016-08-31 DIAGNOSIS — M5431 Sciatica, right side: Secondary | ICD-10-CM

## 2016-08-31 DIAGNOSIS — Z79899 Other long term (current) drug therapy: Secondary | ICD-10-CM

## 2016-08-31 DIAGNOSIS — G35 Multiple sclerosis: Secondary | ICD-10-CM | POA: Diagnosis not present

## 2016-08-31 DIAGNOSIS — R3915 Urgency of urination: Secondary | ICD-10-CM | POA: Diagnosis not present

## 2016-08-31 DIAGNOSIS — R261 Paralytic gait: Secondary | ICD-10-CM

## 2016-08-31 DIAGNOSIS — M21372 Foot drop, left foot: Secondary | ICD-10-CM

## 2016-08-31 DIAGNOSIS — R7612 Nonspecific reaction to cell mediated immunity measurement of gamma interferon antigen response without active tuberculosis: Secondary | ICD-10-CM | POA: Insufficient documentation

## 2016-08-31 MED ORDER — OXYCODONE HCL 5 MG PO TABS: 5.0000 mg | ORAL_TABLET | Freq: Four times a day (QID) | ORAL | 0 refills | Status: DC | PRN

## 2016-08-31 MED ORDER — 4-AMINOPYRIDINE POWD: 5.0000 mg | Freq: Three times a day (TID) | 3 refills | Status: DC

## 2016-08-31 NOTE — Addendum Note (Signed)
Addended by: France Ravens I on: 08/31/2016 02:06 PM   Modules accepted: Orders

## 2016-08-31 NOTE — Telephone Encounter (Signed)
4-AP rx. called to [facility]/fim

## 2016-08-31 NOTE — Progress Notes (Signed)
[facility]  PATIENT: Gilverto Dileonardo DOB: 04-05-1950  REFERRING CLINICIAN: Melinda Crutch HISTORY FROM: Patient  REASON FOR VISIT: Multiple Sclerosis   HISTORICAL  CHIEF COMPLAINT:  Chief Complaint  Patient presents with  . Multiple Sclerosis    Sts.  he contintues to tolerate Tecfidera well.  Thinks balance and weakness bilat legs is worse.  Ambulatory with rolling walker.  Stopped Ampyra about 6 mos. ago due to cost.  He also wasn't sure how much it helped, but wife sts.  he took it inconsistently/fim    HISTORY OF PRESENT ILLNESS:  Mr. Jurgens is a 66 year old man  with multiple sclerosis.   He is noting that balance and leg strength are worse.    MS:   Currently, he is on Tecfidera. He tolerates it well but has continued to worsen over the last couple of years. We discussed that he has had a more progressive course, really since the onset of his disease, and primary progressive MS is more likely than a relapsing remitting MS.      Gait/strength/sensation:   He has poor balance and left > right weakness and spasticity.   He stumbles but no falls.     He now uses a walker for longer distance outside his home but the cane most of the time.    Ampyra seemed to stop helping so he stopped.   He has spasticity, worse first thing in the morning     He denies numbness in the legs.     Exercise:  He belongs to a gym ([employer]) and they participate in a [employer] program.  We discussed that therapy and exercises  may be beneficial and I recommend that he considers doing more.     Pain:   He continues to have pain in the right buttock region and leg more than the left    Oxycodone and baclofen helps the pain.     He notes his pain in the legs is worse when he lays down at night, especially down the right leg. He notes more spasticity at night also.    He does not have side effects with the baclofen.  Bladder:   He reports bladder urgency and frequency and has had some incontinence.  Oxybutynin and Detrol have only helped mildly.  He has 2 x nocturia most nights.    Vision:  He notes no MS related vision problems.  No diplopia or major acuity issues.   He has readers and other glasses for driving.     Fatigue/Sleep:   He reports fatigue that is physical and cognitive. He does try to stay active but notes that walking is more difficult so he is less active than he was last year.       He falls sleeps fairly well most nights.  He has 2 x nocturia but quickly falls asleep.  He naps most days and feels much better afterwards.   He snores at night. He does have some snorting but no definite pauses in his breathing.    He does not want to get a PSG and would not wear CPAP or an oral appliance.    Mood/cognition   he feels he is doing well with mood and denies any depression. However, he sometimes has some apathy. There is no anxiety.    He feels his cognition is doing well and he does activities on the computer without difficulty.       Other:   He is unable  to work. 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   MS History:  Around 2013, he  presented with left leg weakness and gait spasticity and clumsiness. He had MRIs of the brain and spine. Findings were consistent with multiple sclerosis. He was referred to me and I placed him on Tecfidera.  Initially, he had GI symptoms and diarrhea from Tecfidera but this is much better now.    He gets frequent CBC's due to his hematologic disorder and lymphocyte count is usually about 1.0.     REVIEW OF SYSTEMS:  Constitutional: No fevers, chills, sweats, or change in appetite.   He snores and has some insomnia.  He has heat intolerance Eyes: He has notes some visual blurring.  Eyes itch.    He denies double vision, eye pain Ear, nose and throat: No hearing loss, ear pain, nasal congestion, sore throat Cardiovascular: No chest pain, palpitations Respiratory:  He  notes cough and occ  SOB GastrointestinaI: No nausea, vomiting, diarrhea, abdominal pain, fecal incontinence Genitourinary:  as above Musculoskeletal:  No neck pain, some back pain Integumentary: No rash, pruritus, skin lesions Neurological: as above.  And has noted RLS Psychiatric: He notes mild depression at this time.  No anxiety Endocrine: No palpitations, diaphoresis, change in appetite, change in weight or increased thirst Hematologic/Lymphatic:  No anemia, purpura, petechiae. Allergic/Immunologic: No itchy/runny eyes, nasal congestion, recent allergic reactions, rashes  ALLERGIES: No Known Allergies  HOME MEDICATIONS: Outpatient Medications Prior to Visit  Medication Sig Dispense Refill  . aspirin 81 MG tablet Take 81 mg by mouth daily.    . baclofen (LIORESAL) 10 MG tablet Take one po qAM, one po qPM and 2 po qHS 360 tablet 3  . cholecalciferol (VITAMIN D) 1000 units tablet Take 5,000 Units by mouth daily.    . folic acid (FOLVITE) 242 MCG tablet Take 800 mcg by mouth daily. Indicated for macrocytosis per pt    . omeprazole (PRILOSEC OTC) 20 MG tablet Take 20 mg by mouth daily.    . TECFIDERA 240 MG CPDR Take 1 capsule (240 mg total) by mouth 2 (two) times daily. 180 capsule 3  . vitamin B-12 (CYANOCOBALAMIN) 1000 MCG tablet Take 5,000 mcg by mouth daily. Indicated for macrocytosis per pt    . oxyCODONE (OXY IR/ROXICODONE) 5 MG immediate release tablet Take 1 tablet (5 mg total) by mouth every 6 (six) hours as needed for severe pain. 120 tablet 0  . AMPYRA 10 MG TB12 Take 1 tablet (10 mg total) by mouth 2 (two) times daily. (Patient not taking: Reported on 08/31/2016) 60 tablet 11   No facility-administered medications prior to visit.     PAST MEDICAL HISTORY: Past Medical History:  Diagnosis Date  . Anxiety 12/22/2013  . Hearing loss   . Movement disorder   . Multiple sclerosis ([place])   . Neuromuscular disorder ([place])    MS  . 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 Vision  abnormalities     PAST SURGICAL HISTORY: Past Surgical History:  Procedure Laterality Date  . BUNIONECTOMY      FAMILY HISTORY: Family History  Problem Relation Age of Onset  . Cancer Maternal Grandmother        pancreatic ca  . Dementia Mother   . Diabetes type II Father   . Hypertension Father     SOCIAL HISTORY:  Social History   Social History  . Marital status: Married  Spouse name: N/A  . Number of children: N/A  . Years of education: N/A   Occupational History  . Not on file.   Social History Main Topics  . Smoking status: Current Every Day Smoker    Packs/day: 0.75    Years: 35.00    Types: Cigarettes  . Smokeless tobacco: Never Used  . Alcohol use 0.0 oz/week     Comment: 3 to 6 oz. of Bourbon daily, and an occasional beer./fim  . Drug use: No  . Sexual activity: Not on file   Other Topics Concern  . Not on file   Social History Narrative  . No narrative on file     PHYSICAL EXAM  Vitals:   08/31/16 1132  BP: (!) 150/88  Pulse: 80  Resp: 16  Weight: 210 lb 8 oz (95.5 kg)  Height: 5' 9.5" (1.765 m)    Body mass index is 30.64 kg/m.   General: The patient is well-developed and well-nourished and in no acute distress  Neurologic Exam  Mental status: The patient is alert and oriented x 3 at the time of the examination. The patient has apparent normal recent and remote memory, with an apparently normal attention span and concentration ability.   Speech is normal.  Cranial nerves: Extraocular movements are full.   Facial symmetry is present. There is good facial sensation to soft touch bilaterally.  Facial strength is normal.  Trapezius and sternocleidomastoid strength is normal. No dysarthria is noted.  The tongue is midline, and the patient has symmetric elevation of the soft palate. No obvious hearing deficits are noted.  Motor:  Muscle bulk is normal. Tone is increased in the legs, left more than right. Strength is 5/5 in the arms.  Strength is 4+/5 in the right hip flexors and ankle extensors 4 minus/5 in the left hip flexors and left ankle and toe extensors.    Sensory: Sensation is intact to touch and vibration in the arms and legs.  Coordination: Cerebellar testing reveals good finger-nose-finger bilaterally.  The left heel to shin is very poor  Gait and station: Station is stable. The gait is spastic with a left foot drop. He is unable to do tandem walk. Romberg is negative.   Reflexes: Deep tendon reflexes are increased, left greater than right.    DIAGNOSTIC DATA (LABS, IMAGING, TESTING) - I reviewed patient records, labs, notes, testing and imaging myself where available.  Lab Results  Component Value Date   WBC 10.9 (H) 05/08/2016   HGB 16.3 05/08/2016   HCT 47.0 05/08/2016   MCV 102 (H) 05/08/2016   PLT 185 05/08/2016      ASSESSMENT AND PLAN  Multiple sclerosis ([place]) - Plan: oxyCODONE (OXY IR/ROXICODONE) 5 MG immediate release tablet, DG Chest 2 View  High risk medication use - Plan: DG Chest 2 View  Right sided sciatica  Left foot drop  Spastic gait  Urinary urgency  Reaction to QuantiFERON-TB test   1.  We had a conversation about the course of his MS and possible treatments.  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   Therefore, I think he may do better on all his math and we went over the risks and benefits. In the past, I had checked him for hepatitis B. The TB test was indeterminate and he will need to get a chest x-ray performed. After we get those results, we can turn in his service request form.Marland Kitchen  2.   He would like to restart Ampyra. The official patient assistance program was canceled so he can no longer afford it. We discussed a compounded immediate release 4-AP.  We also discussed an AFO if foot drop worsens.   3.   Renew oxycodone. 4.   Stay active and exercise as tolerated.   Continue to try to reduce tobacco 5.    He will  return to see me for the [place] map infusion and informed 5 months for regular follow-up. in 4-5  months or call sooner if he has new or worsening neurologic symptoms.  Haylynn Pha A. Felecia Shelling, MD, PhD 08/26/6679, 5:94 PM Certified in Neurology, Clinical Neurophysiology, Sleep Medicine, Pain Medicine and Neuroimaging  [facility] [address], [place], [place] [address] [phone]

## 2016-09-01 ENCOUNTER — Ambulatory Visit
Admission: RE | Admit: 2016-09-01 | Discharge: 2016-09-01 | Disposition: A | Discharge status: Home / Self Care | Payer: Medicare HMO | Source: Ambulatory Visit | Attending: Neurology | Admitting: Neurology

## 2016-09-01 DIAGNOSIS — Z79899 Other long term (current) drug therapy: Secondary | ICD-10-CM

## 2016-09-01 DIAGNOSIS — R7611 Nonspecific reaction to tuberculin skin test without active tuberculosis: Secondary | ICD-10-CM | POA: Diagnosis not present

## 2016-09-01 DIAGNOSIS — G35 Multiple sclerosis: Secondary | ICD-10-CM

## 2016-09-02 ENCOUNTER — Telehealth: Payer: Self-pay | Admitting: *Deleted

## 2016-09-02 NOTE — Telephone Encounter (Signed)
I have spoken with Vincent Evans this morning and per RAS, advised CXR was ok.  He may start Ocrevus.  He verbalized understanding of same, is agreeable.  Ocrevus srf completed and given to [facility] in the infusion suite/fim

## 2016-09-08 DIAGNOSIS — E782 Mixed hyperlipidemia: Secondary | ICD-10-CM | POA: Diagnosis not present

## 2016-09-09 DIAGNOSIS — Z1212 Encounter for screening for malignant neoplasm of rectum: Secondary | ICD-10-CM | POA: Diagnosis not present

## 2016-09-09 DIAGNOSIS — Z1211 Encounter for screening for malignant neoplasm of colon: Secondary | ICD-10-CM | POA: Diagnosis not present

## 2016-10-01 DIAGNOSIS — K7460 Unspecified cirrhosis of liver: Secondary | ICD-10-CM | POA: Diagnosis not present

## 2016-10-01 DIAGNOSIS — Z7289 Other problems related to lifestyle: Secondary | ICD-10-CM | POA: Diagnosis not present

## 2016-10-01 DIAGNOSIS — I441 Atrioventricular block, second degree: Secondary | ICD-10-CM | POA: Diagnosis not present

## 2016-10-01 DIAGNOSIS — I739 Peripheral vascular disease, unspecified: Secondary | ICD-10-CM | POA: Diagnosis not present

## 2016-10-22 ENCOUNTER — Telehealth: Payer: Self-pay | Admitting: Neurology

## 2016-10-22 DIAGNOSIS — G35 Multiple sclerosis: Secondary | ICD-10-CM

## 2016-10-22 MED ORDER — OXYCODONE HCL 5 MG PO TABS: 5.0000 mg | ORAL_TABLET | Freq: Four times a day (QID) | ORAL | 0 refills | Status: DC | PRN

## 2016-10-22 NOTE — Telephone Encounter (Signed)
Rx. awaiting RAS sig/fim 

## 2016-10-22 NOTE — Telephone Encounter (Signed)
Rx. up front [facility]/fim 

## 2016-10-22 NOTE — Telephone Encounter (Signed)
Pt has called for a refill of oxyCODONE (OXY IR/ROXICODONE) 5 MG immediate release tablet

## 2016-10-22 NOTE — Addendum Note (Signed)
Addended by: France Ravens I on: 10/22/2016 10:59 AM   Modules accepted: Orders

## 2016-10-28 DIAGNOSIS — G35 Multiple sclerosis: Secondary | ICD-10-CM | POA: Diagnosis not present

## 2016-11-11 DIAGNOSIS — G35 Multiple sclerosis: Secondary | ICD-10-CM | POA: Diagnosis not present

## 2016-11-20 ENCOUNTER — Telehealth: Payer: Self-pay | Admitting: Neurology

## 2016-11-20 DIAGNOSIS — G35 Multiple sclerosis: Secondary | ICD-10-CM

## 2016-11-20 MED ORDER — OXYCODONE HCL 5 MG PO TABS: 5.0000 mg | ORAL_TABLET | Freq: Four times a day (QID) | ORAL | 0 refills | Status: DC | PRN

## 2016-11-20 NOTE — Addendum Note (Signed)
Addended by: France Ravens I on: 11/20/2016 09:33 AM   Modules accepted: Orders

## 2016-11-20 NOTE — Telephone Encounter (Signed)
Rx. awaiting RAS sig/fim 

## 2016-11-20 NOTE — Telephone Encounter (Signed)
Rx. up front [facility]/fim 

## 2016-11-20 NOTE — Telephone Encounter (Signed)
Patient called office requesting refill for oxyCODONE (OXY IR/ROXICODONE) 5 MG immediate release tablet

## 2016-11-26 DIAGNOSIS — R69 Illness, unspecified: Secondary | ICD-10-CM | POA: Diagnosis not present

## 2016-12-22 DIAGNOSIS — L821 Other seborrheic keratosis: Secondary | ICD-10-CM | POA: Diagnosis not present

## 2016-12-22 DIAGNOSIS — Z85828 Personal history of other malignant neoplasm of skin: Secondary | ICD-10-CM | POA: Diagnosis not present

## 2016-12-22 DIAGNOSIS — L570 Actinic keratosis: Secondary | ICD-10-CM | POA: Diagnosis not present

## 2016-12-22 DIAGNOSIS — B372 Candidiasis of skin and nail: Secondary | ICD-10-CM | POA: Diagnosis not present

## 2016-12-23 DIAGNOSIS — M810 Age-related osteoporosis without current pathological fracture: Secondary | ICD-10-CM | POA: Diagnosis not present

## 2016-12-23 DIAGNOSIS — I739 Peripheral vascular disease, unspecified: Secondary | ICD-10-CM | POA: Diagnosis not present

## 2016-12-23 DIAGNOSIS — E782 Mixed hyperlipidemia: Secondary | ICD-10-CM | POA: Diagnosis not present

## 2016-12-23 DIAGNOSIS — G35 Multiple sclerosis: Secondary | ICD-10-CM | POA: Diagnosis not present

## 2016-12-23 DIAGNOSIS — Z79899 Other long term (current) drug therapy: Secondary | ICD-10-CM | POA: Diagnosis not present

## 2016-12-23 DIAGNOSIS — Z125 Encounter for screening for malignant neoplasm of prostate: Secondary | ICD-10-CM | POA: Diagnosis not present

## 2016-12-23 DIAGNOSIS — Z0000 Encounter for general adult medical examination without abnormal findings: Secondary | ICD-10-CM | POA: Diagnosis not present

## 2016-12-23 DIAGNOSIS — Z131 Encounter for screening for diabetes mellitus: Secondary | ICD-10-CM | POA: Diagnosis not present

## 2016-12-23 DIAGNOSIS — R030 Elevated blood-pressure reading, without diagnosis of hypertension: Secondary | ICD-10-CM | POA: Diagnosis not present

## 2016-12-28 ENCOUNTER — Telehealth: Payer: Self-pay | Admitting: Neurology

## 2016-12-28 DIAGNOSIS — G35 Multiple sclerosis: Secondary | ICD-10-CM

## 2016-12-28 MED ORDER — OXYCODONE HCL 5 MG PO TABS: 5.0000 mg | ORAL_TABLET | Freq: Four times a day (QID) | ORAL | 0 refills | Status: DC | PRN

## 2016-12-28 NOTE — Telephone Encounter (Signed)
Pt request refill for oxyCODONE (OXY IR/ROXICODONE) 5 MG immediate release tablet

## 2016-12-28 NOTE — Telephone Encounter (Signed)
Rx. up front [facility]/fim 

## 2017-01-18 DIAGNOSIS — M79605 Pain in left leg: Secondary | ICD-10-CM | POA: Diagnosis not present

## 2017-01-18 DIAGNOSIS — R0989 Other specified symptoms and signs involving the circulatory and respiratory systems: Secondary | ICD-10-CM | POA: Diagnosis not present

## 2017-01-18 DIAGNOSIS — Z0000 Encounter for general adult medical examination without abnormal findings: Secondary | ICD-10-CM | POA: Diagnosis not present

## 2017-01-18 DIAGNOSIS — R69 Illness, unspecified: Secondary | ICD-10-CM | POA: Diagnosis not present

## 2017-01-18 DIAGNOSIS — I739 Peripheral vascular disease, unspecified: Secondary | ICD-10-CM | POA: Diagnosis not present

## 2017-01-18 DIAGNOSIS — M545 Low back pain: Secondary | ICD-10-CM | POA: Diagnosis not present

## 2017-01-18 DIAGNOSIS — M79604 Pain in right leg: Secondary | ICD-10-CM | POA: Diagnosis not present

## 2017-01-18 DIAGNOSIS — E785 Hyperlipidemia, unspecified: Secondary | ICD-10-CM | POA: Diagnosis not present

## 2017-01-18 DIAGNOSIS — R030 Elevated blood-pressure reading, without diagnosis of hypertension: Secondary | ICD-10-CM | POA: Diagnosis not present

## 2017-01-18 DIAGNOSIS — G35 Multiple sclerosis: Secondary | ICD-10-CM | POA: Diagnosis not present

## 2017-01-26 ENCOUNTER — Encounter: Payer: Self-pay | Admitting: Neurology

## 2017-01-26 ENCOUNTER — Other Ambulatory Visit: Payer: Self-pay

## 2017-01-26 ENCOUNTER — Encounter (INDEPENDENT_AMBULATORY_CARE_PROVIDER_SITE_OTHER): Payer: Self-pay

## 2017-01-26 ENCOUNTER — Ambulatory Visit: Payer: Medicare HMO | Admitting: Neurology

## 2017-01-26 VITALS — BP 163/103 | HR 87 | Resp 16 | Ht 69.5 in | Wt 205.0 lb

## 2017-01-26 DIAGNOSIS — R3915 Urgency of urination: Secondary | ICD-10-CM | POA: Diagnosis not present

## 2017-01-26 DIAGNOSIS — F17200 Nicotine dependence, unspecified, uncomplicated: Secondary | ICD-10-CM

## 2017-01-26 DIAGNOSIS — G35 Multiple sclerosis: Secondary | ICD-10-CM

## 2017-01-26 DIAGNOSIS — K5900 Constipation, unspecified: Secondary | ICD-10-CM | POA: Diagnosis not present

## 2017-01-26 DIAGNOSIS — R261 Paralytic gait: Secondary | ICD-10-CM | POA: Diagnosis not present

## 2017-01-26 DIAGNOSIS — M21372 Foot drop, left foot: Secondary | ICD-10-CM | POA: Diagnosis not present

## 2017-01-26 DIAGNOSIS — R69 Illness, unspecified: Secondary | ICD-10-CM | POA: Diagnosis not present

## 2017-01-26 MED ORDER — OXYCODONE HCL 5 MG PO TABS: 5.0000 mg | ORAL_TABLET | Freq: Four times a day (QID) | ORAL | 0 refills | Status: DC | PRN

## 2017-01-26 NOTE — Progress Notes (Signed)
[facility]  PATIENT: Vincent Evans DOB: 05/28/1950  REFERRING CLINICIAN: Melinda Crutch HISTORY FROM: Patient  REASON FOR VISIT: Multiple Sclerosis   HISTORICAL  CHIEF COMPLAINT:  Chief Complaint  Patient presents with  . Multiple Sclerosis    Had parts A and B of first Ocrevus infusion in September.  Per wife, some improvement in walking since starting 4-AP/fim    HISTORY OF PRESENT ILLNESS:  Vincent Evans is a 66 year old man  with multiple sclerosis.   He is noting that balance and leg strength are worse.    Update 01/26/2017:     He had his first split dose of Ocrevus in September.   He tolerated it well and there were no infusion reactions.     He feels he has some days where he is doing a little better with gait/balance.      He also feel his gait may be mildly better on compounded Ampyra.  He use a cane for short distance and his walker for longer.    He has constipation (averaging going every other day).   Bladder function is ok during the day but he has 2 x nocturia.   He feels tired every afternoon.     He snores but has not been noted to   He cut his drinking down.   He is using a Vape instead of cigarettes to an extent (still smokes 15 cigarettes a day).  From 08/31/2016: MS:   Currently, he is on Tecfidera. He tolerates it well but has continued to worsen over the last couple of years. We discussed that he has had a more progressive course, really since the onset of his disease, and primary progressive MS is more likely than a relapsing remitting MS.      Gait/strength/sensation:   He has poor balance and left > right weakness and spasticity.   He stumbles but no falls.     He now uses a walker for longer distance outside his home but the cane most of the time.    Ampyra seemed to stop helping so he stopped.   He has spasticity, worse first thing in the morning     He denies numbness in the legs.     Exercise:  He belongs to a gym ([employer]) and they participate in a  Silver Sne[employer] program.  We discussed that therapy and exercises  may be beneficial and I recommend that he considers doing more.     Pain:   He continues to have pain in the right buttock region and leg more than the left    Oxycodone and baclofen helps the pain.     He notes his pain in the legs is worse when he lays down at night, especially down the right leg. He notes more spasticity at night also.    He does not have side effects with the baclofen.  Bladder:   He reports bladder urgency and frequency and has had some incontinence. Oxybutynin and Detrol have only helped mildly.  He has 2 x nocturia most nights.    Vision:  He notes no MS related vision problems.  No diplopia or major acuity issues.   He has readers and other glasses for driving.     Fatigue/Sleep:   He reports fatigue that is physical and cognitive. He does try to stay active but notes that walking is more difficult so he is less active than he was last year.       He  falls sleeps fairly well most nights.  He has 2 x nocturia but quickly falls asleep.  He naps most days and feels much better afterwards.   He snores at night. He does have some snorting but no definite pauses in his breathing.    He does not want to get a PSG and would not wear CPAP or an oral appliance.    Mood/cognition   he feels he is doing well with mood and denies any depression. However, he sometimes has some apathy. There is no anxiety.    He feels his cognition is doing well and he does activities on the computer without difficulty.       Other:   He is unable to work. 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   MS History:  Around 2013, he  presented with left leg weakness and gait spasticity and clumsiness. He had MRIs of the brain and spine. Findings were consistent with multiple sclerosis. He was referred to me and I placed him on Tecfidera.  Initially, he had GI symptoms  and diarrhea from Tecfidera but this is much better now.    He gets frequent CBC's due to his hematologic disorder and lymphocyte count is usually about 1.0.     REVIEW OF SYSTEMS:  Constitutional: No fevers, chills, sweats, or change in appetite.   He snores and has some insomnia.  He has heat intolerance Eyes: He has notes some visual blurring.  Eyes itch.    He denies double vision, eye pain Ear, nose and throat: No hearing loss, ear pain, nasal congestion, sore throat Cardiovascular: No chest pain, palpitations Respiratory:  He notes cough and occ  SOB GastrointestinaI: No nausea, vomiting, diarrhea, abdominal pain, fecal incontinence Genitourinary:  as above Musculoskeletal:  No neck pain, some back pain Integumentary: No rash, pruritus, skin lesions Neurological: as above.  And has noted RLS Psychiatric: He notes mild depression at this time.  No anxiety Endocrine: No palpitations, diaphoresis, change in appetite, change in weight or increased thirst Hematologic/Lymphatic:  No anemia, purpura, petechiae. Allergic/Immunologic: No itchy/runny eyes, nasal congestion, recent allergic reactions, rashes  ALLERGIES: No Known Allergies  HOME MEDICATIONS: Outpatient Medications Prior to Visit  Medication Sig Dispense Refill  . aspirin 81 MG tablet Take 81 mg by mouth daily.    . baclofen (LIORESAL) 10 MG tablet Take one po qAM, one po qPM and 2 po qHS 360 tablet 3  . cholecalciferol (VITAMIN D) 1000 units tablet Take 5,000 Units by mouth daily.    . cilostazol (PLETAL) 100 MG tablet     . Dalfampridine (4-AMINOPYRIDINE) POWD Take 5 mg by mouth 3 (three) times daily. 270 Bottle 3  . LIVALO 4 MG TABS     . ocrelizumab 600 mg in sodium chloride 0.9 % 500 mL Inject 600 mg into the vein every 6 (six) months.    Marland Kitchen omeprazole (PRILOSEC OTC) 20 MG tablet Take 20 mg by mouth daily.    . vitamin B-12 (CYANOCOBALAMIN) 1000 MCG tablet Take 5,000 mcg by mouth daily. Indicated for macrocytosis per pt     . oxyCODONE (OXY IR/ROXICODONE) 5 MG immediate release tablet Take 1 tablet (5 mg total) every 6 (six) hours as needed by mouth for severe pain. 403 tablet 0  . folic acid (FOLVITE) 474 MCG tablet Take 800 mcg by mouth daily. Indicated for macrocytosis per pt    . TECFIDERA 240 MG  CPDR Take 1 capsule (240 mg total) by mouth 2 (two) times daily. 180 capsule 3   No facility-administered medications prior to visit.     PAST MEDICAL HISTORY: Past Medical History:  Diagnosis Date  . Anxiety 12/22/2013  . Hearing loss   . Movement disorder   . Multiple sclerosis ([place])   . Neuromuscular disorder ([place])    MS  . 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 Vision abnormalities     PAST SURGICAL HISTORY: Past Surgical History:  Procedure Laterality Date  . BUNIONECTOMY      FAMILY HISTORY: Family History  Problem Relation Age of Onset  . Cancer Maternal Grandmother        pancreatic ca  . Dementia Mother   . Diabetes type II Father   . Hypertension Father     SOCIAL HISTORY:  Social History   Socioeconomic History  . Marital status: Married    Spouse name: Not on file  . Number of children: Not on file  . Years of education: Not on file  . Highest education level: Not on file  Social Needs  . Financial resource strain: Not on file  . Food insecurity - worry: Not on file  . Food insecurity - inability: Not on file  . Transportation needs - medical: Not on file  . Transportation needs - non-medical: Not on file  Occupational History  . Not on file  Tobacco Use  . Smoking status: Current Every Day Smoker    Packs/day: 0.75    Years: 35.00    Pack years: 26.25    Types: Cigarettes  . Smokeless tobacco: Never Used  Substance and Sexual Activity  . Alcohol use: Yes    Alcohol/week: 0.0 oz    Comment: 3 to 6 oz. of Bourbon daily, and an occasional beer./fim  . Drug use: No  . Sexual activity: Not on file  Other Topics Concern  . Not on file  Social History Narrative    . Not on file     PHYSICAL EXAM  Vitals:   01/26/17 1305  BP: (!) 163/103  Pulse: 87  Resp: 16  Weight: 205 lb (93 kg)  Height: 5' 9.5" (1.765 m)    Body mass index is 29.84 kg/m.   General: The patient is well-developed and well-nourished and in no acute distress  Neurologic Exam  Mental status: The patient is alert and oriented x 3 at the time of the examination. The patient has apparent normal recent and remote memory, with an apparently normal attention span and concentration ability.   Speech is normal.  Cranial nerves: Extraocular movements are full.   0Facial symmetry is present. There is good facial sensation to soft touch bilaterally.  Facial strength is normal.  Trapezius and sternocleidomastoid strength is normal. No dysarthria is noted.  The tongue is midline, and the patient has symmetric elevation of the soft palate. No obvious hearing deficits are noted.  Motor:  Muscle bulk is normal. Tone is increased in the legs, left more than right. Strength is 5/5 in the arms. Strength is 3 to 4-/5 proximally in the legs and 4/5 distally, left weaker than right  Sensory: Sensation is intact to touch and vibration in the arms and legs.  Coordination: Cerebellar testing reveals good finger-nose-finger bilaterally.  The left heel to shin is very poor  Gait and station: Station is stable. The gait is spastic with a left foot drop. He is unable to do tandem walk. Romberg is negative.  Reflexes: Deep tendon reflexes are increased, left greater than right.    DIAGNOSTIC DATA (LABS, IMAGING, TESTING) - I reviewed patient records, labs, notes, testing and imaging myself where available.  Lab Results  Component Value Date   WBC 10.9 (H) 05/08/2016   HGB 16.3 05/08/2016   HCT 47.0 05/08/2016   MCV 102 (H) 05/08/2016   PLT 185 05/08/2016      ASSESSMENT AND PLAN  Spastic gait  Multiple sclerosis (HCC) - Plan: oxyCODONE (OXY IR/ROXICODONE) 5 MG immediate release  tablet  Urinary urgency  Left foot drop  Smoking  Constipation, unspecified constipation type   1.  Continue Ocrevus infusion --- next one is during March 2019..      2.  Continue Ampyra for gait  3.  Renew oxycodone. 4.  Stay active and exercise as tolerated.   Continue to try to reduce tobacco further 5.  Try colace +/- Senna 6.   RTC 5  months or call sooner if he has new or worsening neurologic symptoms.  Vincent Saintjean A. Felecia Shelling, MD, PhD 88/02/1029, 5:94 PM Certified in Neurology, Clinical Neurophysiology, Sleep Medicine, Pain Medicine and Neuroimaging  [facility] [address] [place], [place] [address] [phone]

## 2017-02-25 ENCOUNTER — Telehealth: Payer: Self-pay | Admitting: Neurology

## 2017-02-25 DIAGNOSIS — G35 Multiple sclerosis: Secondary | ICD-10-CM

## 2017-02-25 MED ORDER — OXYCODONE HCL 5 MG PO TABS: 5.0000 mg | ORAL_TABLET | Freq: Four times a day (QID) | ORAL | 0 refills | Status: DC | PRN

## 2017-02-25 NOTE — Telephone Encounter (Signed)
Pt request refill for oxyCODONE (OXY IR/ROXICODONE) 5 MG immediate release tablet. Pt is aware the clinic closes at noon tomorrow

## 2017-02-25 NOTE — Telephone Encounter (Signed)
Rx. up front [facility]/fim 

## 2017-03-01 DIAGNOSIS — I10 Essential (primary) hypertension: Secondary | ICD-10-CM | POA: Diagnosis not present

## 2017-03-01 DIAGNOSIS — R42 Dizziness and giddiness: Secondary | ICD-10-CM | POA: Diagnosis not present

## 2017-03-09 DIAGNOSIS — I10 Essential (primary) hypertension: Secondary | ICD-10-CM | POA: Diagnosis not present

## 2017-03-09 DIAGNOSIS — Z0100 Encounter for examination of eyes and vision without abnormal findings: Secondary | ICD-10-CM | POA: Diagnosis not present

## 2017-03-29 ENCOUNTER — Telehealth: Payer: Self-pay | Admitting: Neurology

## 2017-03-29 DIAGNOSIS — Z79899 Other long term (current) drug therapy: Secondary | ICD-10-CM | POA: Diagnosis not present

## 2017-03-29 DIAGNOSIS — I739 Peripheral vascular disease, unspecified: Secondary | ICD-10-CM | POA: Diagnosis not present

## 2017-03-29 DIAGNOSIS — G35 Multiple sclerosis: Secondary | ICD-10-CM

## 2017-03-29 DIAGNOSIS — I10 Essential (primary) hypertension: Secondary | ICD-10-CM | POA: Diagnosis not present

## 2017-03-29 DIAGNOSIS — R69 Illness, unspecified: Secondary | ICD-10-CM | POA: Diagnosis not present

## 2017-03-29 MED ORDER — OXYCODONE HCL 5 MG PO TABS: 5.0000 mg | ORAL_TABLET | Freq: Four times a day (QID) | ORAL | 0 refills | Status: DC | PRN

## 2017-03-29 NOTE — Telephone Encounter (Signed)
Rx. awaiting RAS sig/fim 

## 2017-03-29 NOTE — Addendum Note (Signed)
Addended by: France Ravens I on: 03/29/2017 03:30 PM   Modules accepted: Orders

## 2017-03-29 NOTE — Telephone Encounter (Signed)
Oxycodone rx. up front [facility]/fim 

## 2017-03-29 NOTE — Telephone Encounter (Signed)
Patient requesting refill of oxyCODONE (OXY IR/ROXICODONE) 5 MG immediate release tablet. ° °

## 2017-04-23 DIAGNOSIS — G35 Multiple sclerosis: Secondary | ICD-10-CM | POA: Diagnosis not present

## 2017-04-23 DIAGNOSIS — R269 Unspecified abnormalities of gait and mobility: Secondary | ICD-10-CM | POA: Diagnosis not present

## 2017-04-28 ENCOUNTER — Telehealth: Payer: Self-pay | Admitting: Neurology

## 2017-04-28 DIAGNOSIS — G35 Multiple sclerosis: Secondary | ICD-10-CM

## 2017-04-28 DIAGNOSIS — R269 Unspecified abnormalities of gait and mobility: Secondary | ICD-10-CM

## 2017-04-28 MED ORDER — OXYCODONE HCL 5 MG PO TABS: 5.0000 mg | ORAL_TABLET | Freq: Four times a day (QID) | ORAL | 0 refills | Status: DC | PRN

## 2017-04-28 NOTE — Telephone Encounter (Signed)
Rx. up front [facility]/fim 

## 2017-04-28 NOTE — Telephone Encounter (Signed)
Spoke with [employer].  DME order for Zinger lightweight electric w/c up front [facility], with his prescription, to be picked up/fim

## 2017-04-28 NOTE — Telephone Encounter (Signed)
Rx. awaiting RAS sig/fim 

## 2017-04-28 NOTE — Telephone Encounter (Signed)
Pt called he has a zinger wheelchair he took delivery Monday 04/26/17. He is wanting a script to send to insurance to hopefully pay for it. Please call to advise

## 2017-04-28 NOTE — Telephone Encounter (Signed)
Pt request refill for oxyCODONE (OXY IR/ROXICODONE) 5 MG immediate release tablet

## 2017-05-13 DIAGNOSIS — G35 Multiple sclerosis: Secondary | ICD-10-CM | POA: Diagnosis not present

## 2017-06-01 ENCOUNTER — Telehealth: Payer: Self-pay | Admitting: Neurology

## 2017-06-01 DIAGNOSIS — G35 Multiple sclerosis: Secondary | ICD-10-CM

## 2017-06-01 DIAGNOSIS — R69 Illness, unspecified: Secondary | ICD-10-CM | POA: Diagnosis not present

## 2017-06-01 MED ORDER — OXYCODONE HCL 5 MG PO TABS: 5.0000 mg | ORAL_TABLET | Freq: Four times a day (QID) | ORAL | 0 refills | Status: DC | PRN

## 2017-06-01 NOTE — Telephone Encounter (Signed)
Rx. up front [facility]/fim 

## 2017-06-01 NOTE — Telephone Encounter (Signed)
Rx. awaiting RAS sig/fim 

## 2017-06-01 NOTE — Telephone Encounter (Signed)
Pt requesting a refill for oxyCODONE (OXY IR/ROXICODONE) 5 MG immediate release tablet

## 2017-06-30 ENCOUNTER — Telehealth: Payer: Self-pay | Admitting: Neurology

## 2017-06-30 DIAGNOSIS — G35 Multiple sclerosis: Secondary | ICD-10-CM

## 2017-06-30 MED ORDER — OXYCODONE HCL 5 MG PO TABS: 5.0000 mg | ORAL_TABLET | Freq: Four times a day (QID) | ORAL | 0 refills | Status: DC | PRN

## 2017-06-30 NOTE — Addendum Note (Signed)
Addended by: France Ravens I on: 06/30/2017 11:23 AM   Modules accepted: Orders

## 2017-06-30 NOTE — Telephone Encounter (Signed)
Rx. awaiting RAS sig/fim 

## 2017-06-30 NOTE — Telephone Encounter (Signed)
Pr has called for a refill for oxyCODONE (OXY IR/ROXICODONE) 5 MG immediate release tablet

## 2017-06-30 NOTE — Telephone Encounter (Signed)
Rx. up front [facility]/fim 

## 2017-07-27 ENCOUNTER — Ambulatory Visit: Payer: Medicare HMO | Admitting: Neurology

## 2017-07-27 ENCOUNTER — Encounter: Payer: Self-pay | Admitting: Neurology

## 2017-07-27 ENCOUNTER — Other Ambulatory Visit: Payer: Self-pay

## 2017-07-27 VITALS — BP 114/65 | HR 70 | Resp 18 | Ht 69.5 in | Wt 205.0 lb

## 2017-07-27 DIAGNOSIS — R261 Paralytic gait: Secondary | ICD-10-CM | POA: Diagnosis not present

## 2017-07-27 DIAGNOSIS — F17200 Nicotine dependence, unspecified, uncomplicated: Secondary | ICD-10-CM

## 2017-07-27 DIAGNOSIS — G35 Multiple sclerosis: Secondary | ICD-10-CM | POA: Diagnosis not present

## 2017-07-27 DIAGNOSIS — Z79899 Other long term (current) drug therapy: Secondary | ICD-10-CM | POA: Diagnosis not present

## 2017-07-27 DIAGNOSIS — R3915 Urgency of urination: Secondary | ICD-10-CM | POA: Diagnosis not present

## 2017-07-27 DIAGNOSIS — R69 Illness, unspecified: Secondary | ICD-10-CM | POA: Diagnosis not present

## 2017-07-27 MED ORDER — SOLIFENACIN SUCCINATE 10 MG PO TABS: 10.0000 mg | ORAL_TABLET | Freq: Every day | ORAL | 11 refills | Status: DC

## 2017-07-27 MED ORDER — OXYCODONE HCL 5 MG PO TABS: 5.0000 mg | ORAL_TABLET | Freq: Four times a day (QID) | ORAL | 0 refills | Status: DC | PRN

## 2017-07-27 NOTE — Progress Notes (Signed)
[facility]  PATIENT: Vincent Evans DOB: May 14, 1950  REFERRING CLINICIAN: Melinda Evans HISTORY FROM: Patient  REASON FOR VISIT: Multiple Sclerosis   HISTORICAL  CHIEF COMPLAINT:  Chief Complaint  Patient presents with  . Multiple Sclerosis    Sts. he continues to tolerate Ocrevus well.  Last infusion was in March. This was his first full dose. Sts. he has more stiffness, weakness both legs, left worse than right.  Would like to discuss otc supplements and if he needs to continue them/fim    HISTORY OF PRESENT ILLNESS:  Vincent Evans is a 67 year old man  with multiple sclerosis.   He is noting that balance and leg strength are worse.    Update 07/27/2017: He is on Ocrevus and he tolerates it well.  Last infusion was in March; this was the second dose.Marland Kitchen    He feels weaker, especially over the last week.   He hasn't been able to use the cane and even with a walker he is doing worse.     Both legs are weak, left worse than right.    He was doing PT in the past at [facility] and he feels it helped some in the past.     On April 3, he fell and landed on his knees and arms.    Ribs were bruised but no fractures.   He feels he has done worse with his gait since the fall.    He is on 4-aminopyridine (compounded).  He tolerates it well and feels it  have been helping his gait some.  He has urinary urgency and some urge incontinence.   He has occasional constipation.   patient is fine.  He notes some frustration and irritability at times.  He has tried to quit smoking but with more issues from his MS lately, he went back.  He denies any difficulty with cognition.  Update 01/26/2017:     He had his first split dose of Ocrevus in September.   He tolerated it well and there were no infusion reactions.     He feels he has some days where he is doing a little better with gait/balance.      He also feel his gait may be mildly better on compounded Ampyra.  He use a cane for short distance and his walker  for longer.    He has constipation (averaging going every other day).   Bladder function is ok during the day but he has 2 x nocturia.   He feels tired every afternoon.     He snores but has not been noted to   He cut his drinking down.   He is using a Vape instead of cigarettes to an extent (still smokes 15 cigarettes a day).  From 08/31/2016: MS:   Currently, he is on Tecfidera. He tolerates it well but has continued to worsen over the last couple of years. We discussed that he has had a more progressive course, really since the onset of his disease, and primary progressive MS is more likely than a relapsing remitting MS.      Gait/strength/sensation:   He has poor balance and left > right weakness and spasticity.   He stumbles but no falls.     He now uses a walker for longer distance outside his home but the cane most of the time.    Ampyra seemed to stop helping so he stopped.   He has spasticity, worse first thing in the morning  He denies numbness in the legs.     Exercise:  He belongs to a gym ([facility]) and they participate in a Silver Sneakers program.  We discussed that therapy and exercises  may be beneficial and I recommend that he considers doing more.     Pain:   He continues to have pain in the right buttock region and leg more than the left    Oxycodone and baclofen helps the pain.     He notes his pain in the legs is worse when he lays down at night, especially down the right leg. He notes more spasticity at night also.    He does not have side effects with the baclofen.  Bladder:   He reports bladder urgency and frequency and has had some incontinence. Oxybutynin and Detrol have only helped mildly.  He has 2 x nocturia most nights.    Vision:  He notes no MS related vision problems.  No diplopia or major acuity issues.   He has readers and other glasses for driving.     Fatigue/Sleep:   He reports fatigue that is physical and cognitive. He does try to stay active but notes that  walking is more difficult so he is less active than he was last year.       He falls sleeps fairly well most nights.  He has 2 x nocturia but quickly falls asleep.  He naps most days and feels much better afterwards.   He snores at night. He does have some snorting but no definite pauses in his breathing.    He does not want to get a PSG and would not wear CPAP or an oral appliance.    Mood/cognition   he feels he is doing well with mood and denies any depression. However, he sometimes has some apathy. There is no anxiety.    He feels his cognition is doing well and he does activities on the computer without difficulty.       Other:   He is unable to work. 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   MS History:  Around 2013, he  presented with left leg weakness and gait spasticity and clumsiness. He had MRIs of the brain and spine. Findings were consistent with multiple sclerosis. He was referred to me and I placed him on Tecfidera.  Initially, he had GI symptoms and diarrhea from Tecfidera but this is much better now.    He gets frequent CBC's due to his hematologic disorder and lymphocyte count is usually about 1.0.     REVIEW OF SYSTEMS:  Constitutional: No fevers, chills, sweats, or change in appetite.   He snores and has some insomnia.  He has heat intolerance Eyes: He has notes some visual blurring.  Eyes itch.    He denies double vision, eye pain Ear, nose and throat: No hearing loss, ear pain, nasal congestion, sore throat Cardiovascular: No chest pain, palpitations Respiratory:  He notes cough and occ  SOB GastrointestinaI: No nausea, vomiting, diarrhea, abdominal pain, fecal incontinence Genitourinary:  as above Musculoskeletal:  No neck pain, some back pain Integumentary: No rash, pruritus, skin lesions Neurological: as above.  And has noted RLS Psychiatric: He notes mild depression at this time.  No  anxiety Endocrine: No palpitations, diaphoresis, change in appetite, change in weight or increased thirst Hematologic/Lymphatic:  No anemia, purpura, petechiae. Allergic/Immunologic: No itchy/runny eyes, nasal congestion, recent allergic  reactions, rashes  ALLERGIES: No Known Allergies  HOME MEDICATIONS: Outpatient Medications Prior to Visit  Medication Sig Dispense Refill  . baclofen (LIORESAL) 10 MG tablet Take one po qAM, one po qPM and 2 po qHS 360 tablet 3  . cholecalciferol (VITAMIN D) 1000 units tablet Take 5,000 Units by mouth daily.    . cilostazol (PLETAL) 100 MG tablet     . Dalfampridine (4-AMINOPYRIDINE) POWD Take 5 mg by mouth 3 (three) times daily. 270 Bottle 3  . ocrelizumab 600 mg in sodium chloride 0.9 % 500 mL Inject 600 mg into the vein every 6 (six) months.    Marland Kitchen omeprazole (PRILOSEC OTC) 20 MG tablet Take 20 mg by mouth daily.    Marland Kitchen oxyCODONE (OXY IR/ROXICODONE) 5 MG immediate release tablet Take 1 tablet (5 mg total) by mouth every 6 (six) hours as needed for severe pain. 120 tablet 0  . aspirin 81 MG tablet Take 81 mg by mouth daily.    . folic acid (FOLVITE) 151 MCG tablet Take 800 mcg by mouth daily. Indicated for macrocytosis per pt    . vitamin B-12 (CYANOCOBALAMIN) 1000 MCG tablet Take 5,000 mcg by mouth daily. Indicated for macrocytosis per pt    . LIVALO 4 MG TABS     . TECFIDERA 240 MG CPDR Take 1 capsule (240 mg total) by mouth 2 (two) times daily. 180 capsule 3   No facility-administered medications prior to visit.     PAST MEDICAL HISTORY: Past Medical History:  Diagnosis Date  . Anxiety 12/22/2013  . Hearing loss   . Movement disorder   . Multiple sclerosis ([place])   . Neuromuscular disorder ([place])    MS  . 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 Vision abnormalities     PAST SURGICAL HISTORY: Past Surgical History:  Procedure Laterality Date  . BUNIONECTOMY      FAMILY HISTORY: Family History  Problem Relation Age of Onset  . Cancer  Maternal Grandmother        pancreatic ca  . Dementia Mother   . Diabetes type II Father   . Hypertension Father     SOCIAL HISTORY:  Social History   Socioeconomic History  . Marital status: Married    Spouse name: Not on file  . Number of children: Not on file  . Years of education: Not on file  . Highest education level: Not on file  Occupational History  . Not on file  Social Needs  . Financial resource strain: Not on file  . Food insecurity:    Worry: Not on file    Inability: Not on file  . Transportation needs:    Medical: Not on file    Non-medical: Not on file  Tobacco Use  . Smoking status: Current Every Day Smoker    Packs/day: 0.75    Years: 35.00    Pack years: 26.25    Types: Cigarettes  . Smokeless tobacco: Never Used  Substance and Sexual Activity  . Alcohol use: Yes    Alcohol/week: 0.0 oz    Comment: 3 to 6 oz. of Bourbon daily, and an occasional beer./fim  . Drug use: No  . Sexual activity: Not on file  Lifestyle  . Physical activity:    Days per week: Not on file    Minutes per session: Not on file  . Stress: Not on file  Relationships  . Social connections:    Talks on phone: Not on file    Gets together: Not on  file    Attends religious service: Not on file    Active member of club or organization: Not on file    Attends meetings of clubs or organizations: Not on file    Relationship status: Not on file  . Intimate partner violence:    Fear of current or ex partner: Not on file    Emotionally abused: Not on file    Physically abused: Not on file    Forced sexual activity: Not on file  Other Topics Concern  . Not on file  Social History Narrative  . Not on file     PHYSICAL EXAM  Vitals:   07/27/17 1128  BP: 114/65  Pulse: 70  Resp: 18  Weight: 205 lb (93 kg)  Height: 5' 9.5" (1.765 m)    Body mass index is 29.84 kg/m.   General: The patient is well-developed and well-nourished and in no acute distress  Neurologic  Exam  Mental status: The patient is alert and oriented x 3 at the time of the examination. The patient has apparent normal recent and remote memory, with an apparently normal attention span and concentration ability.   Speech is normal.  Cranial nerves: Extraocular movements are full.   0Facial symmetry is present. There is good facial sensation to soft touch bilaterally.  Facial strength is normal.  Trapezius and sternocleidomastoid strength is normal. No dysarthria is noted.  The tongue is midline, and the patient has symmetric elevation of the soft palate. No obvious hearing deficits are noted.  Motor:  Muscle bulk is normal. Tone is increased in the legs, left more than right.  Strength is 5/5 in the arms.  Strength is 3/5 in the right leg and 2+/5 in the left leg.    Sensory: He has intact sensation to vibration and touch in the legs  Coordination: Cerebellar testing reveals good finger-nose-finger bilaterally.  The left heel to shin is very poor  Gait and station: Station is stable.  He needs bilateral support to stand or walk. He is unable to do tandem walk. Romberg is negative.   Reflexes: Deep tendon reflexes are increased, left greater than right.    DIAGNOSTIC DATA (LABS, IMAGING, TESTING) - I reviewed patient records, labs, notes, testing and imaging myself where available.  Lab Results  Component Value Date   WBC 10.9 (H) 05/08/2016   HGB 16.3 05/08/2016   HCT 47.0 05/08/2016   MCV 102 (H) 05/08/2016   PLT 185 05/08/2016      ASSESSMENT AND PLAN  Spastic gait  Multiple sclerosis (HCC)  Smoking  Urinary urgency   1.  He will continue Ocrevus.  The next infusion will be around September.   I will check a CBC in the IgG. 2.  Continue Ampyra for gait  3.  Renew oxycodone.    Add Vesicare for bladder.  If this is not helpful, consider adding Myrbetriq's. 4.  Stay active and exercise as tolerated.    we discussed trying to quit smoking again. 5.  RTC 6  months or  call sooner if he has new or worsening neurologic symptoms.  40 minutes face-to-face evaluation with greater than one half the time counseling and coordinating care about his MS and related symptoms  Tarisha Fader A. Felecia Shelling, MD, PhD 2/0/9470, 96:28 PM Certified in Neurology, Clinical Neurophysiology, Sleep Medicine, Pain Medicine and Neuroimaging  [facility] [address], [place], [place] [address] [phone]

## 2017-07-28 LAB — CBC WITH DIFFERENTIAL/PLATELET
BASOS ABS: 0 10*3/uL (ref 0.0–0.2)
BASOS: 0 %
EOS (ABSOLUTE): 0.1 10*3/uL (ref 0.0–0.4)
Eos: 1 %
Hematocrit: 48.4 % (ref 37.5–51.0)
Hemoglobin: 17 g/dL (ref 13.0–17.7)
Immature Grans (Abs): 0.1 10*3/uL (ref 0.0–0.1)
Immature Granulocytes: 1 %
Lymphocytes Absolute: 1.5 10*3/uL (ref 0.7–3.1)
Lymphs: 14 %
MCH: 35.2 pg — AB (ref 26.6–33.0)
MCHC: 35.1 g/dL (ref 31.5–35.7)
MCV: 100 fL — AB (ref 79–97)
MONOS ABS: 1.1 10*3/uL — AB (ref 0.1–0.9)
Monocytes: 10 %
NEUTROS ABS: 8 10*3/uL — AB (ref 1.4–7.0)
Neutrophils: 74 %
PLATELETS: 248 10*3/uL (ref 150–450)
RBC: 4.83 x10E6/uL (ref 4.14–5.80)
RDW: 13.8 % (ref 12.3–15.4)
WBC: 10.8 10*3/uL (ref 3.4–10.8)

## 2017-07-28 LAB — IGG, IGA, IGM
IgA/Immunoglobulin A, Serum: 337 mg/dL (ref 61–437)
IgG (Immunoglobin G), Serum: 993 mg/dL (ref 700–1600)
IgM (Immunoglobulin M), Srm: 98 mg/dL (ref 20–172)

## 2017-07-29 ENCOUNTER — Telehealth: Payer: Self-pay | Admitting: *Deleted

## 2017-07-29 NOTE — Telephone Encounter (Signed)
-----   Message from Britt Bottom, MD sent at 07/28/2017  5:10 PM EDT ----- Please let the patient know that the lab work is fine.

## 2017-07-29 NOTE — Telephone Encounter (Signed)
LMOM that lab work he had done in our office was fine.  He does not need to return this call unless he has questions/fim

## 2017-08-09 DIAGNOSIS — Z85828 Personal history of other malignant neoplasm of skin: Secondary | ICD-10-CM | POA: Diagnosis not present

## 2017-08-09 DIAGNOSIS — L820 Inflamed seborrheic keratosis: Secondary | ICD-10-CM | POA: Diagnosis not present

## 2017-09-02 ENCOUNTER — Telehealth: Payer: Self-pay | Admitting: Neurology

## 2017-09-02 DIAGNOSIS — G35 Multiple sclerosis: Secondary | ICD-10-CM

## 2017-09-02 MED ORDER — OXYCODONE HCL 5 MG PO TABS: 5.0000 mg | ORAL_TABLET | Freq: Four times a day (QID) | ORAL | 0 refills | Status: DC | PRN

## 2017-09-02 NOTE — Addendum Note (Signed)
Addended by: France Ravens I on: 09/02/2017 11:59 AM   Modules accepted: Orders

## 2017-09-02 NOTE — Telephone Encounter (Signed)
Pt has called for a refill on his oxyCODONE (OXY IR/ROXICODONE) 5 MG immediate release tablet Pt is aware the office will close at 12 noon on Fridays

## 2017-09-02 NOTE — Telephone Encounter (Signed)
Rx. up front [facility]/fim 

## 2017-09-02 NOTE — Telephone Encounter (Signed)
Rx. awaiting RAS sig/fim 

## 2017-09-02 NOTE — Telephone Encounter (Signed)
error 

## 2017-09-09 ENCOUNTER — Telehealth: Payer: Self-pay | Admitting: Neurology

## 2017-09-09 NOTE — Telephone Encounter (Signed)
Pt states he has a sever case of diarrhea.  Pt wants to know if it has to do with the vitamins he is taking.  Please call

## 2017-09-09 NOTE — Telephone Encounter (Signed)
Spoke with Vincent Evans.  He reports diarrhea with a foul odor onset yesterday morning.  Sts. he has been taking otc vitamins for the last 2 mos. and wonders if they are causing diarrhea. We discussed that there is not way to be certain about the possibility of vitamins causing his diarrhea.  If he's been taking them for the last 2 mos. and has not had gi upset until now, there may be another cause. We discussed that there are bacterial and viral infections that can cause severe diarrhea/dehydration, and may need to be treated, and that these would possibly be worse with him b/c of his immunosuppression.  We discussed that he should go ahead and see his pcp, if he feels diarrhea is not improving or he is becoming dehydrated.  We discussed drinking plenty of water with some gatorade or other electrolyte replenishing drink to avoid dehydration.  He verbalized understanding of all of above/fim

## 2017-09-13 ENCOUNTER — Other Ambulatory Visit: Payer: Self-pay | Admitting: Neurology

## 2017-09-13 DIAGNOSIS — D751 Secondary polycythemia: Secondary | ICD-10-CM

## 2017-09-16 ENCOUNTER — Telehealth: Payer: Self-pay | Admitting: Neurology

## 2017-09-16 MED ORDER — 4-AMINOPYRIDINE POWD: 5.0000 mg | Freq: Three times a day (TID) | 3 refills | Status: DC

## 2017-09-16 NOTE — Telephone Encounter (Signed)
Pt is calling re: his Aminopyridine 5mg  tablets, he states it is generally filled thru [facility] .  Pt states he has recently been told this medication has been denied.  Pt is asking for a call back from RN Faith to discuss his taking Aminopyridine 5mg  tablets

## 2017-09-16 NOTE — Addendum Note (Signed)
Addended by: France Ravens I on: 09/16/2017 02:51 PM   Modules accepted: Orders

## 2017-09-16 NOTE — Telephone Encounter (Signed)
Spoke with [employer].  I have not received a r/f request from [facility], but am happy to send the 4-AP in.  Rx. escribed per his request/fim

## 2017-09-20 MED ORDER — 4-AMINOPYRIDINE POWD: 5.0000 mg | Freq: Three times a day (TID) | 3 refills | Status: DC

## 2017-09-20 NOTE — Telephone Encounter (Signed)
E-Prescribing Status: Receipt confirmed by pharmacy (09/20/2017 2:29 PM EDT)

## 2017-09-20 NOTE — Telephone Encounter (Signed)
Rx was sent by Faith on 09/16/17, but I have resent it again today. I will check pharmacy receipt confirmation later today.

## 2017-09-20 NOTE — Telephone Encounter (Signed)
[name]/[facility] [phone] has tried to escribe request twice (once this morning) but it was denied. I advised her I did not see a request.  Please send refill for [facility] not [facility]. The hours are 9-7pm.

## 2017-09-20 NOTE — Addendum Note (Signed)
Addended by: Belinda Block A on: 09/20/2017 02:28 PM   Modules accepted: Orders

## 2017-09-28 ENCOUNTER — Telehealth: Payer: Self-pay | Admitting: Neurology

## 2017-09-28 DIAGNOSIS — G35 Multiple sclerosis: Secondary | ICD-10-CM

## 2017-09-28 MED ORDER — OXYCODONE HCL 5 MG PO TABS: 5.0000 mg | ORAL_TABLET | Freq: Four times a day (QID) | ORAL | 0 refills | Status: DC | PRN

## 2017-09-28 NOTE — Telephone Encounter (Signed)
Patient requesting refill of oxyCODONE (OXY IR/ROXICODONE) 5 MG immediate release tablet. ° °

## 2017-09-28 NOTE — Telephone Encounter (Signed)
Rx. awaiting RAS sig/fim 

## 2017-09-28 NOTE — Addendum Note (Signed)
Addended by: France Ravens I on: 09/28/2017 11:01 AM   Modules accepted: Orders

## 2017-09-28 NOTE — Telephone Encounter (Signed)
Rx. up front [facility]/fim 

## 2017-11-01 ENCOUNTER — Telehealth: Payer: Self-pay | Admitting: Neurology

## 2017-11-01 DIAGNOSIS — G35 Multiple sclerosis: Secondary | ICD-10-CM

## 2017-11-01 MED ORDER — OXYCODONE HCL 5 MG PO TABS: 5.0000 mg | ORAL_TABLET | Freq: Four times a day (QID) | ORAL | 0 refills | Status: DC | PRN

## 2017-11-01 NOTE — Telephone Encounter (Signed)
Rx on Dr. Garth Bigness desk for signature. I checked drug registry and he last refilled 10/05/17 qty 120, not receiving from other MD's.  Refill not due until 11/05/17.  Last seen 07/27/17 and next f/u pending for 01/27/18.

## 2017-11-01 NOTE — Addendum Note (Signed)
Addended by: Hope Pigeon on: 11/01/2017 09:26 AM   Modules accepted: Orders

## 2017-11-01 NOTE — Telephone Encounter (Signed)
Patient requesting refill of oxyCODONE (OXY IR/ROXICODONE) 5 MG immediate release tablet. I advised Dr. Felecia Shelling is out of the office until Thursday. Patient says will pick up Rx.

## 2017-11-16 DIAGNOSIS — G35 Multiple sclerosis: Secondary | ICD-10-CM | POA: Diagnosis not present

## 2017-12-02 DIAGNOSIS — R69 Illness, unspecified: Secondary | ICD-10-CM | POA: Diagnosis not present

## 2017-12-08 ENCOUNTER — Telehealth: Payer: Self-pay | Admitting: Neurology

## 2017-12-08 DIAGNOSIS — G35 Multiple sclerosis: Secondary | ICD-10-CM

## 2017-12-08 MED ORDER — OXYCODONE HCL 5 MG PO TABS: 5.0000 mg | ORAL_TABLET | Freq: Four times a day (QID) | ORAL | 0 refills | Status: DC | PRN

## 2017-12-08 NOTE — Addendum Note (Signed)
Addended by: France Ravens I on: 12/08/2017 11:16 AM   Modules accepted: Orders

## 2017-12-08 NOTE — Telephone Encounter (Signed)
Rx. awaiting RAS sig/fim 

## 2017-12-08 NOTE — Telephone Encounter (Signed)
Patient requesting refill of oxyCODONE (OXY IR/ROXICODONE) 5 MG immediate release tablet. ° °

## 2017-12-08 NOTE — Telephone Encounter (Signed)
Rx. up front [facility]/fim 

## 2018-01-04 DIAGNOSIS — Z0000 Encounter for general adult medical examination without abnormal findings: Secondary | ICD-10-CM | POA: Diagnosis not present

## 2018-01-04 DIAGNOSIS — I10 Essential (primary) hypertension: Secondary | ICD-10-CM | POA: Diagnosis not present

## 2018-01-04 DIAGNOSIS — I739 Peripheral vascular disease, unspecified: Secondary | ICD-10-CM | POA: Diagnosis not present

## 2018-01-04 DIAGNOSIS — R7301 Impaired fasting glucose: Secondary | ICD-10-CM | POA: Diagnosis not present

## 2018-01-04 DIAGNOSIS — L02224 Furuncle of groin: Secondary | ICD-10-CM | POA: Diagnosis not present

## 2018-01-04 DIAGNOSIS — R42 Dizziness and giddiness: Secondary | ICD-10-CM | POA: Diagnosis not present

## 2018-01-04 DIAGNOSIS — E782 Mixed hyperlipidemia: Secondary | ICD-10-CM | POA: Diagnosis not present

## 2018-01-04 DIAGNOSIS — Z125 Encounter for screening for malignant neoplasm of prostate: Secondary | ICD-10-CM | POA: Diagnosis not present

## 2018-01-04 DIAGNOSIS — M810 Age-related osteoporosis without current pathological fracture: Secondary | ICD-10-CM | POA: Diagnosis not present

## 2018-01-10 ENCOUNTER — Telehealth: Payer: Self-pay | Admitting: Neurology

## 2018-01-10 DIAGNOSIS — G35 Multiple sclerosis: Secondary | ICD-10-CM

## 2018-01-10 MED ORDER — OXYCODONE HCL 5 MG PO TABS: 5.0000 mg | ORAL_TABLET | Freq: Four times a day (QID) | ORAL | 0 refills | Status: DC | PRN

## 2018-01-10 NOTE — Telephone Encounter (Signed)
[facility] registry checked. She last refilled 12/09/17 #120. Not receiving from other MD's. Last seen 07/27/17 and next f/u 01/27/18. Rx printed, waiting on MD signature

## 2018-01-10 NOTE — Telephone Encounter (Signed)
Rx. up front [facility]/fim 

## 2018-01-10 NOTE — Addendum Note (Signed)
Addended by: Hope Pigeon on: 01/10/2018 09:25 AM   Modules accepted: Orders

## 2018-01-10 NOTE — Telephone Encounter (Signed)
Patient requesting refill of oxyCODONE (OXY IR/ROXICODONE) 5 MG immediate release tablet.

## 2018-01-27 ENCOUNTER — Ambulatory Visit (INDEPENDENT_AMBULATORY_CARE_PROVIDER_SITE_OTHER): Payer: Medicare HMO | Admitting: Neurology

## 2018-01-27 DIAGNOSIS — G35 Multiple sclerosis: Secondary | ICD-10-CM | POA: Diagnosis not present

## 2018-01-27 MED ORDER — OXYCODONE HCL 5 MG PO TABS: 5.0000 mg | ORAL_TABLET | Freq: Four times a day (QID) | ORAL | 0 refills | Status: DC | PRN

## 2018-01-27 NOTE — Progress Notes (Signed)
[facility]  PATIENT: Vincent Evans DOB: Apr 18, 1950  REFERRING CLINICIAN: Melinda Crutch HISTORY FROM: Patient  REASON FOR VISIT: Multiple Sclerosis   HISTORICAL  CHIEF COMPLAINT:  Chief Complaint  Patient presents with  . Follow-up    RM 11, with wife. No improvement since last seen. His mobility has worsened. Used to be able to use cane to help with ambulation but now has to use walker and wheelchair. Right leg used to be better leg but that function has decreased. No vision changes. In wheelchair in office today.     HISTORY OF PRESENT ILLNESS:  Vincent Evans is a 67 y.o. man with multiple sclerosis.   He is noting that balance and leg strength are worse.    Update 01/27/2018: He is on Ocrevus.  He tolerates it well.  The last infusion (the third 1) was September 2019.  He has noted some continued worsening of his neurologic function.  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  Ocrevus is the only FDA approved medication for PMS.    He did try biotin and alpha lipoic acid but had trouble tolerating both of them due to GI distress.  He feels the right leg has caught up to the weakness of the left leg.  Gait is more difficulty.  With a cane and furniture/walls, he can go about 30 feet without stopping.  He will use a wheelchair when outside..  He has Ampyra (actually compounded for aminopyridine)   .  He is not sure how much it is helping but he did notice improvement when he started on it.    He has some urinary symptoms that are stable.  Vision is fine.  He denies any difficulty with cognition.  He notes mild depression but not bad enough to take medication for.  He sleeps well most nights.  Update 07/27/2017: He is on Ocrevus and he tolerates it well.  Last infusion was in March; this was the second dose.Marland Kitchen    He feels weaker, especially over the last week.   He hasn't been able to use the cane and even with a walker  he is doing worse.     Both legs are weak, left worse than right.    He was doing PT in the past at [facility] and he feels it helped some in the past.     On April 3, he fell and landed on his knees and arms.    Ribs were bruised but no fractures.   He feels he has done worse with his gait since the fall.    He is on 4-aminopyridine (compounded).  He tolerates it well and feels it  have been helping his gait some.  He has urinary urgency and some urge incontinence.   He has occasional constipation.   patient is fine.  He notes some frustration and irritability at times.  He has tried to quit smoking but with more issues from his MS lately, he went back.  He denies any difficulty with cognition.  Update 01/26/2017:     He had his first split dose of Ocrevus in September.   He tolerated it well and there were no infusion reactions.     He feels he has some days where he is doing a little better with gait/balance.      He also feel his gait may be mildly better on compounded Ampyra.  He use a cane for  short distance and his walker for longer.    He has constipation (averaging going every other day).   Bladder function is ok during the day but he has 2 x nocturia.   He feels tired every afternoon.     He snores but has not been noted to   He cut his drinking down.   He is using a Vape instead of cigarettes to an extent (still smokes 15 cigarettes a day).  From 08/31/2016: MS:   Currently, he is on Tecfidera. He tolerates it well but has continued to worsen over the last couple of years. We discussed that he has had a more progressive course, really since the onset of his disease, and primary progressive MS is more likely than a relapsing remitting MS.      Gait/strength/sensation:   He has poor balance and left > right weakness and spasticity.   He stumbles but no falls.     He now uses a walker for longer distance outside his home but the cane most of the time.    Ampyra seemed to stop helping so he stopped.   He  has spasticity, worse first thing in the morning     He denies numbness in the legs.     Exercise:  He belongs to a gym ([employer]) and they participate in a Silver Sneakers program.  We discussed that therapy and exercises  may be beneficial and I recommend that he considers doing more.     Pain:   He continues to have pain in the right buttock region and leg more than the left    Oxycodone and baclofen helps the pain.     He notes his pain in the legs is worse when he lays down at night, especially down the right leg. He notes more spasticity at night also.    He does not have side effects with the baclofen.  Bladder:   He reports bladder urgency and frequency and has had some incontinence. Oxybutynin and Detrol have only helped mildly.  He has 2 x nocturia most nights.    Vision:  He notes no MS related vision problems.  No diplopia or major acuity issues.   He has readers and other glasses for driving.     Fatigue/Sleep:   He reports fatigue that is physical and cognitive. He does try to stay active but notes that walking is more difficult so he is less active than he was last year.       He falls sleeps fairly well most nights.  He has 2 x nocturia but quickly falls asleep.  He naps most days and feels much better afterwards.   He snores at night. He does have some snorting but no definite pauses in his breathing.    He does not want to get a PSG and would not wear CPAP or an oral appliance.    Mood/cognition   he feels he is doing well with mood and denies any depression. However, he sometimes has some apathy. There is no anxiety.    He feels his cognition is doing well and he does activities on the computer without difficulty.       Other:   He is unable to work. 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   MS History:  Around 2013, he  presented with left leg weakness and gait spasticity and clumsiness. He had MRIs  of  the brain and spine. Findings were consistent with multiple sclerosis. He was referred to me and I placed him on Tecfidera.  Initially, he had GI symptoms and diarrhea from Tecfidera but this is much better now.    He gets frequent CBC's due to his hematologic disorder and lymphocyte count is usually about 1.0.     REVIEW OF SYSTEMS:  Constitutional: No fevers, chills, sweats, or change in appetite.   He snores and has some insomnia.  He has heat intolerance Eyes: He has notes some visual blurring.  Eyes itch.    He denies double vision, eye pain Ear, nose and throat: No hearing loss, ear pain, nasal congestion, sore throat Cardiovascular: No chest pain, palpitations Respiratory:  He notes cough and occ  SOB GastrointestinaI: No nausea, vomiting, diarrhea, abdominal pain, fecal incontinence Genitourinary:  as above Musculoskeletal:  No neck pain, some back pain Integumentary: No rash, pruritus, skin lesions Neurological: as above.  And has noted RLS Psychiatric: He notes mild depression at this time.  No anxiety Endocrine: No palpitations, diaphoresis, change in appetite, change in weight or increased thirst Hematologic/Lymphatic:  No anemia, purpura, petechiae. Allergic/Immunologic: No itchy/runny eyes, nasal congestion, recent allergic reactions, rashes  ALLERGIES: No Known Allergies  HOME MEDICATIONS: Outpatient Medications Prior to Visit  Medication Sig Dispense Refill  . aspirin 81 MG tablet Take 81 mg by mouth daily.    Marland Kitchen atorvastatin (LIPITOR) 10 MG tablet Take 10 mg by mouth daily.    . baclofen (LIORESAL) 10 MG tablet TAKE 1 TABLET BY MOUTH IN THE MORNING AND 1 IN THE EVENING AND 2 AT BEDTIME 360 tablet 3  . cholecalciferol (VITAMIN D) 1000 units tablet Take 5,000 Units by mouth daily.    . cilostazol (PLETAL) 100 MG tablet     . clotrimazole (LOTRIMIN) 1 % cream Apply 1 application topically 2 (two) times daily. For groin/toes    . Dalfampridine (4-AMINOPYRIDINE) POWD Take 5  mg by mouth 3 (three) times daily. 270 Bottle 3  . LISINOPRIL-HYDROCHLOROTHIAZIDE PO Take 1 tablet by mouth daily.    Marland Kitchen ocrelizumab 600 mg in sodium chloride 0.9 % 500 mL Inject 600 mg into the vein every 6 (six) months.    . vitamin B-12 (CYANOCOBALAMIN) 1000 MCG tablet Take 5,000 mcg by mouth daily. Indicated for macrocytosis per pt    . oxyCODONE (OXY IR/ROXICODONE) 5 MG immediate release tablet Take 1 tablet (5 mg total) by mouth every 6 (six) hours as needed for severe pain. 120 tablet 0  . solifenacin (VESICARE) 10 MG tablet Take 1 tablet (10 mg total) by mouth daily. 30 tablet 11  . folic acid (FOLVITE) 409 MCG tablet Take 800 mcg by mouth daily. Indicated for macrocytosis per pt    . omeprazole (PRILOSEC OTC) 20 MG tablet Take 20 mg by mouth daily.     No facility-administered medications prior to visit.     PAST MEDICAL HISTORY: Past Medical History:  Diagnosis Date  . Anxiety 12/22/2013  . Hearing loss   . Movement disorder   . Multiple sclerosis ([place])   . Neuromuscular disorder ([place])    MS  . 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 Vision abnormalities     PAST SURGICAL HISTORY: Past Surgical History:  Procedure Laterality Date  . BUNIONECTOMY      FAMILY HISTORY: Family History  Problem Relation Age of Onset  . Cancer Maternal Grandmother        pancreatic ca  . Dementia Mother   .  Diabetes type II Father   . Hypertension Father     SOCIAL HISTORY:  Social History   Socioeconomic History  . Marital status: Married    Spouse name: Not on file  . Number of children: Not on file  . Years of education: Not on file  . Highest education level: Not on file  Occupational History  . Not on file  Social Needs  . Financial resource strain: Not on file  . Food insecurity:    Worry: Not on file    Inability: Not on file  . Transportation needs:    Medical: Not on file    Non-medical: Not on file  Tobacco Use  . Smoking status: Current Every Day Smoker     Packs/day: 0.75    Years: 35.00    Pack years: 26.25    Types: Cigarettes  . Smokeless tobacco: Never Used  Substance and Sexual Activity  . Alcohol use: Yes    Alcohol/week: 0.0 standard drinks    Comment: 3 to 6 oz. of Bourbon daily, and an occasional beer./fim  . Drug use: No  . Sexual activity: Not on file  Lifestyle  . Physical activity:    Days per week: Not on file    Minutes per session: Not on file  . Stress: Not on file  Relationships  . Social connections:    Talks on phone: Not on file    Gets together: Not on file    Attends religious service: Not on file    Active member of club or organization: Not on file    Attends meetings of clubs or organizations: Not on file    Relationship status: Not on file  . Intimate partner violence:    Fear of current or ex partner: Not on file    Emotionally abused: Not on file    Physically abused: Not on file    Forced sexual activity: Not on file  Other Topics Concern  . Not on file  Social History Narrative  . Not on file     PHYSICAL EXAM  Vitals:   01/27/18 1235  BP: (!) 141/90  Pulse: 61    There is no height or weight on file to calculate BMI.   General: The patient is well-developed and well-nourished and in no acute distress  Neurologic Exam  Mental status: The patient is alert and oriented x 3 at the time of the examination. The patient has apparent normal recent and remote memory, with an apparently normal attention span and concentration ability.   Speech is normal.  Cranial nerves: Extraocular movements are full.   0Facial symmetry is present. There is good facial sensation to soft touch bilaterally.  Facial strength is normal.  Trapezius and sternocleidomastoid strength is normal. No dysarthria is noted.  The tongue is midline, and the patient has symmetric elevation of the soft palate. No obvious hearing deficits are noted.  Motor:  Muscle bulk is normal.  Muscle tone is increased in the legs, left a  little more than the right.  Strength is normal in the arms.  Strength is 2+ to 3/5 in the right leg and 2+/5 in the left leg.    Sensory: He has intact sensation to touch and vibration in the legs.   Coordination: Cerebellar testing reveals good finger-nose-finger bilaterally.  He cannot do heel-to-shin.  Gait and station: Station is stable.  He needs bilateral support to stand or walk. He is unable to do tandem walk. Romberg is  negative.   Reflexes: Deep tendon reflexes are increased, left greater than right.    DIAGNOSTIC DATA (LABS, IMAGING, TESTING) - I reviewed patient records, labs, notes, testing and imaging myself where available.  Lab Results  Component Value Date   WBC 10.8 07/27/2017   HGB 17.0 07/27/2017   HCT 48.4 07/27/2017   MCV 100 (H) 07/27/2017   PLT 248 07/27/2017      ASSESSMENT AND PLAN  Multiple sclerosis ([place]) - Plan: oxyCODONE (OXY IR/ROXICODONE) 5 MG immediate release tablet   1.  We had a long discussion about options for primary progressive MS.  Ocrevus is the only FDA approved medication.  There is a small possibility that he could have secondary progressive MS and we discussed [place] as well.  However, I am not sure he would get any benefit for primary progressive if he went on that medication.  Lab work was good when last checked (normal IgG/IgM)   2.  Continue 4-AP for gait  3.  Renew oxycodone.      4.  Stay active and exercise as tolerated.     5.  RTC 6  months or call sooner if he has new or worsening neurologic symptoms.  Mekiah Cambridge A. Felecia Shelling, MD, PhD 38/04/2917, 1:66 PM Certified in Neurology, Clinical Neurophysiology, Sleep Medicine, Pain Medicine and Neuroimaging  [facility] [address], [place], [place] [address] [phone]

## 2018-03-02 DIAGNOSIS — R69 Illness, unspecified: Secondary | ICD-10-CM | POA: Diagnosis not present

## 2018-03-08 ENCOUNTER — Other Ambulatory Visit: Payer: Self-pay

## 2018-03-08 ENCOUNTER — Inpatient Hospital Stay (HOSPITAL_COMMUNITY)
Admission: EM | Admit: 2018-03-08 | Discharge: 2018-03-10 | DRG: 194 | Disposition: A | Discharge status: Home / Self Care | Payer: Medicare HMO | Attending: Internal Medicine | Admitting: Internal Medicine

## 2018-03-08 ENCOUNTER — Encounter (HOSPITAL_COMMUNITY): Payer: Self-pay | Admitting: Emergency Medicine

## 2018-03-08 ENCOUNTER — Inpatient Hospital Stay (HOSPITAL_COMMUNITY): Payer: Medicare HMO

## 2018-03-08 ENCOUNTER — Emergency Department (HOSPITAL_COMMUNITY): Payer: Medicare HMO

## 2018-03-08 DIAGNOSIS — F419 Anxiety disorder, unspecified: Secondary | ICD-10-CM | POA: Diagnosis present

## 2018-03-08 DIAGNOSIS — D751 Secondary polycythemia: Secondary | ICD-10-CM | POA: Diagnosis not present

## 2018-03-08 DIAGNOSIS — R05 Cough: Secondary | ICD-10-CM | POA: Diagnosis not present

## 2018-03-08 DIAGNOSIS — I10 Essential (primary) hypertension: Secondary | ICD-10-CM | POA: Diagnosis present

## 2018-03-08 DIAGNOSIS — H9190 Unspecified hearing loss, unspecified ear: Secondary | ICD-10-CM | POA: Diagnosis present

## 2018-03-08 DIAGNOSIS — R109 Unspecified abdominal pain: Secondary | ICD-10-CM | POA: Diagnosis not present

## 2018-03-08 DIAGNOSIS — G35 Multiple sclerosis: Secondary | ICD-10-CM | POA: Diagnosis not present

## 2018-03-08 DIAGNOSIS — W1830XA Fall on same level, unspecified, initial encounter: Secondary | ICD-10-CM | POA: Diagnosis not present

## 2018-03-08 DIAGNOSIS — R110 Nausea: Secondary | ICD-10-CM | POA: Diagnosis not present

## 2018-03-08 DIAGNOSIS — S0990XA Unspecified injury of head, initial encounter: Secondary | ICD-10-CM | POA: Diagnosis not present

## 2018-03-08 DIAGNOSIS — Z8249 Family history of ischemic heart disease and other diseases of the circulatory system: Secondary | ICD-10-CM

## 2018-03-08 DIAGNOSIS — Z79899 Other long term (current) drug therapy: Secondary | ICD-10-CM | POA: Diagnosis not present

## 2018-03-08 DIAGNOSIS — S199XXA Unspecified injury of neck, initial encounter: Secondary | ICD-10-CM | POA: Diagnosis not present

## 2018-03-08 DIAGNOSIS — J101 Influenza due to other identified influenza virus with other respiratory manifestations: Principal | ICD-10-CM | POA: Diagnosis present

## 2018-03-08 DIAGNOSIS — Z79891 Long term (current) use of opiate analgesic: Secondary | ICD-10-CM | POA: Diagnosis not present

## 2018-03-08 DIAGNOSIS — W19XXXA Unspecified fall, initial encounter: Secondary | ICD-10-CM

## 2018-03-08 DIAGNOSIS — Z7982 Long term (current) use of aspirin: Secondary | ICD-10-CM

## 2018-03-08 DIAGNOSIS — F17210 Nicotine dependence, cigarettes, uncomplicated: Secondary | ICD-10-CM | POA: Diagnosis present

## 2018-03-08 DIAGNOSIS — R69 Illness, unspecified: Secondary | ICD-10-CM | POA: Diagnosis not present

## 2018-03-08 DIAGNOSIS — R296 Repeated falls: Secondary | ICD-10-CM | POA: Diagnosis not present

## 2018-03-08 DIAGNOSIS — G259 Extrapyramidal and movement disorder, unspecified: Secondary | ICD-10-CM | POA: Diagnosis present

## 2018-03-08 DIAGNOSIS — R52 Pain, unspecified: Secondary | ICD-10-CM | POA: Diagnosis not present

## 2018-03-08 DIAGNOSIS — R5381 Other malaise: Secondary | ICD-10-CM

## 2018-03-08 DIAGNOSIS — E871 Hypo-osmolality and hyponatremia: Secondary | ICD-10-CM | POA: Diagnosis not present

## 2018-03-08 DIAGNOSIS — R531 Weakness: Secondary | ICD-10-CM | POA: Diagnosis not present

## 2018-03-08 DIAGNOSIS — R197 Diarrhea, unspecified: Secondary | ICD-10-CM | POA: Diagnosis not present

## 2018-03-08 DIAGNOSIS — J09X2 Influenza due to identified novel influenza A virus with other respiratory manifestations: Secondary | ICD-10-CM | POA: Diagnosis present

## 2018-03-08 LAB — COMPREHENSIVE METABOLIC PANEL
ALT: 48 U/L — ABNORMAL HIGH (ref 0–44)
AST: 132 U/L — ABNORMAL HIGH (ref 15–41)
Albumin: 3.8 g/dL (ref 3.5–5.0)
Alkaline Phosphatase: 56 U/L (ref 38–126)
Anion gap: 10 (ref 5–15)
BUN: 12 mg/dL (ref 8–23)
CALCIUM: 8.2 mg/dL — AB (ref 8.9–10.3)
CO2: 26 mmol/L (ref 22–32)
Chloride: 87 mmol/L — ABNORMAL LOW (ref 98–111)
Creatinine, Ser: 0.96 mg/dL (ref 0.61–1.24)
GFR calc Af Amer: >60 mL/min (ref >60)
Glucose, Bld: 108 mg/dL — ABNORMAL HIGH (ref 70–99)
Potassium: 3.7 mmol/L (ref 3.5–5.1)
Sodium: 123 mmol/L — ABNORMAL LOW (ref 135–145)
Total Bilirubin: 1 mg/dL (ref 0.3–1.2)
Total Protein: 6.9 g/dL (ref 6.5–8.1)

## 2018-03-08 LAB — CBC WITH DIFFERENTIAL/PLATELET
Abs Immature Granulocytes: 0.05 10*3/uL (ref 0.00–0.07)
Basophils Absolute: 0 10*3/uL (ref 0.0–0.1)
Basophils Relative: 0 %
EOS ABS: 0.2 10*3/uL (ref 0.0–0.5)
Eosinophils Relative: 3 %
HCT: 43.3 % (ref 39.0–52.0)
Hemoglobin: 15.2 g/dL (ref 13.0–17.0)
Immature Granulocytes: 1 %
Lymphocytes Relative: 7 %
Lymphs Abs: 0.5 10*3/uL — ABNORMAL LOW (ref 0.7–4.0)
MCH: 35 pg — AB (ref 26.0–34.0)
MCHC: 35.1 g/dL (ref 30.0–36.0)
MCV: 99.8 fL (ref 80.0–100.0)
Monocytes Absolute: 0.7 10*3/uL (ref 0.1–1.0)
Monocytes Relative: 11 %
Neutro Abs: 5.4 10*3/uL (ref 1.7–7.7)
Neutrophils Relative %: 78 %
Platelets: 152 10*3/uL (ref 150–400)
RBC: 4.34 MIL/uL (ref 4.22–5.81)
RDW: 11.6 % (ref 11.5–15.5)
WBC: 6.8 10*3/uL (ref 4.0–10.5)
nRBC: 0 % (ref 0.0–0.2)

## 2018-03-08 LAB — URINALYSIS, ROUTINE W REFLEX MICROSCOPIC
Bilirubin Urine: NEGATIVE
GLUCOSE, UA: NEGATIVE mg/dL
Hgb urine dipstick: NEGATIVE
Ketones, ur: 5 mg/dL — AB
Leukocytes, UA: NEGATIVE
Nitrite: NEGATIVE
PH: 6 (ref 5.0–8.0)
Protein, ur: NEGATIVE mg/dL
Specific Gravity, Urine: 1.015 (ref 1.005–1.030)

## 2018-03-08 LAB — MAGNESIUM: Magnesium: 1.9 mg/dL (ref 1.7–2.4)

## 2018-03-08 LAB — INFLUENZA PANEL BY PCR (TYPE A & B)
Influenza A By PCR: POSITIVE — AB
Influenza B By PCR: NEGATIVE

## 2018-03-08 MED ORDER — 4-AMINOPYRIDINE POWD: 5.0000 mg | Freq: Three times a day (TID) | Status: DC

## 2018-03-08 MED ORDER — ONDANSETRON HCL 4 MG PO TABS: 4.0000 mg | ORAL_TABLET | Freq: Four times a day (QID) | ORAL | Status: DC | PRN

## 2018-03-08 MED ORDER — VITAMIN B-12 1000 MCG PO TABS
5000.0000 ug | ORAL_TABLET | Freq: Every day | ORAL | Status: DC
  Administered 2018-03-09: 5000 ug via ORAL
  Filled 2018-03-08 (×2): qty 5

## 2018-03-08 MED ORDER — GADOBUTROL 1 MMOL/ML IV SOLN: 9.0000 mL | Freq: Once | INTRAVENOUS | Status: DC | PRN

## 2018-03-08 MED ORDER — CILOSTAZOL 100 MG PO TABS
100.0000 mg | ORAL_TABLET | Freq: Two times a day (BID) | ORAL | Status: DC
  Administered 2018-03-08 – 2018-03-09 (×3): 100 mg via ORAL
  Filled 2018-03-08 (×4): qty 1

## 2018-03-08 MED ORDER — ACETAMINOPHEN 325 MG PO TABS: 650.0000 mg | ORAL_TABLET | Freq: Four times a day (QID) | ORAL | Status: DC | PRN

## 2018-03-08 MED ORDER — ENOXAPARIN SODIUM 40 MG/0.4ML ~~LOC~~ SOLN
40.0000 mg | SUBCUTANEOUS | Status: DC
  Filled 2018-03-08 (×2): qty 0.4

## 2018-03-08 MED ORDER — CLOTRIMAZOLE 1 % EX CREA
1.0000 "application " | TOPICAL_CREAM | Freq: Two times a day (BID) | CUTANEOUS | Status: DC | PRN
  Filled 2018-03-08: qty 15

## 2018-03-08 MED ORDER — OXYCODONE HCL 5 MG PO TABS: 5.0000 mg | ORAL_TABLET | Freq: Four times a day (QID) | ORAL | Status: DC | PRN

## 2018-03-08 MED ORDER — ONDANSETRON HCL 4 MG/2ML IJ SOLN: 4.0000 mg | Freq: Four times a day (QID) | INTRAMUSCULAR | Status: DC | PRN

## 2018-03-08 MED ORDER — BACLOFEN 10 MG PO TABS
10.0000 mg | ORAL_TABLET | Freq: Two times a day (BID) | ORAL | Status: DC
  Administered 2018-03-09 (×2): 10 mg via ORAL
  Filled 2018-03-08 (×3): qty 1

## 2018-03-08 MED ORDER — NICOTINE 21 MG/24HR TD PT24
21.0000 mg | MEDICATED_PATCH | Freq: Every day | TRANSDERMAL | Status: DC
  Filled 2018-03-08 (×2): qty 1

## 2018-03-08 MED ORDER — LISINOPRIL 20 MG PO TABS
20.0000 mg | ORAL_TABLET | Freq: Every day | ORAL | Status: DC
  Administered 2018-03-09: 20 mg via ORAL
  Filled 2018-03-08 (×2): qty 1

## 2018-03-08 MED ORDER — LIDOCAINE 5 % EX PTCH: 1.0000 | MEDICATED_PATCH | Freq: Once | CUTANEOUS | Status: DC

## 2018-03-08 MED ORDER — OSELTAMIVIR PHOSPHATE 75 MG PO CAPS
75.0000 mg | ORAL_CAPSULE | Freq: Two times a day (BID) | ORAL | Status: DC
  Administered 2018-03-08 – 2018-03-09 (×3): 75 mg via ORAL
  Filled 2018-03-08 (×4): qty 1

## 2018-03-08 MED ORDER — LISINOPRIL-HYDROCHLOROTHIAZIDE 20-25 MG PO TABS: 1.0000 | ORAL_TABLET | Freq: Every day | ORAL | Status: DC

## 2018-03-08 MED ORDER — SODIUM CHLORIDE 0.9 % IV BOLUS
1000.0000 mL | Freq: Once | INTRAVENOUS | Status: AC
  Administered 2018-03-08: 1000 mL via INTRAVENOUS

## 2018-03-08 MED ORDER — SODIUM CHLORIDE 0.9 % IV SOLN
1000.0000 mg | Freq: Once | INTRAVENOUS | Status: AC
  Administered 2018-03-08: 1000 mg via INTRAVENOUS
  Filled 2018-03-08: qty 8

## 2018-03-08 MED ORDER — VITAMIN D 25 MCG (1000 UNIT) PO TABS
5000.0000 [IU] | ORAL_TABLET | Freq: Every day | ORAL | Status: DC
  Administered 2018-03-08 – 2018-03-09 (×2): 5000 [IU] via ORAL
  Filled 2018-03-08 (×2): qty 5

## 2018-03-08 MED ORDER — ATORVASTATIN CALCIUM 10 MG PO TABS
10.0000 mg | ORAL_TABLET | Freq: Every day | ORAL | Status: DC
  Administered 2018-03-09: 10 mg via ORAL
  Filled 2018-03-08 (×2): qty 1

## 2018-03-08 MED ORDER — BACLOFEN 20 MG PO TABS
20.0000 mg | ORAL_TABLET | Freq: Every day | ORAL | Status: DC
  Administered 2018-03-08 – 2018-03-09 (×2): 20 mg via ORAL
  Filled 2018-03-08 (×2): qty 1

## 2018-03-08 MED ORDER — METHYLPREDNISOLONE SODIUM SUCC 125 MG IJ SOLR
60.0000 mg | Freq: Two times a day (BID) | INTRAMUSCULAR | Status: DC
  Administered 2018-03-09 – 2018-03-10 (×3): 60 mg via INTRAVENOUS
  Filled 2018-03-08 (×4): qty 2

## 2018-03-08 MED ORDER — HYDROCHLOROTHIAZIDE 25 MG PO TABS
25.0000 mg | ORAL_TABLET | Freq: Every day | ORAL | Status: DC
  Administered 2018-03-09: 25 mg via ORAL
  Filled 2018-03-08: qty 1

## 2018-03-08 MED ORDER — ASPIRIN 81 MG PO CHEW
81.0000 mg | CHEWABLE_TABLET | Freq: Every day | ORAL | Status: DC
  Administered 2018-03-09: 81 mg via ORAL
  Filled 2018-03-08 (×2): qty 1

## 2018-03-08 MED ORDER — ACETAMINOPHEN 650 MG RE SUPP: 650.0000 mg | Freq: Four times a day (QID) | RECTAL | Status: DC | PRN

## 2018-03-08 NOTE — ED Notes (Signed)
Bed: [facility] Expected date:  Expected time:  Means of arrival:  Comments: EMS- MS fall

## 2018-03-08 NOTE — ED Notes (Signed)
Patient made aware urine sample is needed. Urinal provided at bedside. 

## 2018-03-08 NOTE — Consult Note (Signed)
Neurology Consultation  Reason for Consult: MS exacerbation Referring Physician: Varney Biles, MD  CC: Fall  History is obtained from: Patient and wife  HPI: Vincent Evans is a 68 y.o. male with PMH of MS who presented s/p fall at home.  Per wife, pt's legs have become weaker recently despite taking his medication.  Pt knows he's not able to stand or walk on his own but couldn't wait because he was having diarrhea and couldn't wait.  Pt was diagnosed in 2013 with MS.  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  Per wife, pt has shown flu-like symptoms for past 4 days and has become weaker since.  Pt complains of productive cough but denies    ED course: pt was found to be flu positive.  Was also given 1g of solu-medrol   Premorbid modified Rankin scale (mRS): 5 0-Completely asymptomatic and back to baseline post-stroke 1-No significant post stroke disability and can perform usual duties with stroke symptoms 2-Slight disability-UNABLE to perform all activities but does not need assistance  3-Moderate disability-requires help but walks WITHOUT assistance 4-Needs assistance to walk and tend to bodily needs 5-Severe disability-bedridden, incontinent, needs constant attention 6- Death   ROS:  Constitutional: Positive for fatigue. Negative for activity change and appetite change.  HENT: Positive for congestion, rhinorrhea and sore throat. Negative for ear pain and postnasal drip.   Eyes: Negative for pain, redness, itching and visual disturbance.  Respiratory: Positive for cough and shortness of breath.   Cardiovascular: Negative for chest pain.  Gastrointestinal: Negative for abdominal pain, diarrhea, nausea and vomiting.  Musculoskeletal: Positive for gait problem. Negative for arthralgias, back pain, joint swelling, myalgias, neck pain and neck stiffness.  Skin: Negative for rash.  Allergic/Immunologic: Negative for environmental allergies.  Neurological:  Positive for weakness. Negative for dizziness, syncope, speech difficulty, numbness and headaches.   Past Medical History:  Diagnosis Date  . Anxiety 12/22/2013  . Hearing loss   . Movement disorder   . Multiple sclerosis ([place])   . Neuromuscular disorder ([place])    MS  . 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 Vision abnormalities     Family History  Problem Relation Age of Onset  . Dementia Mother   . Diabetes type II Father   . Hypertension Father   . Cancer Maternal Grandmother        pancreatic ca     Social History:   reports that he has been smoking cigarettes. He has a 26.25 pack-year smoking history. He has never used smokeless tobacco. He reports current alcohol use. He reports that he does not use drugs.  Medications No current facility-administered medications for this encounter.   Current Outpatient Medications:  .  aspirin 81 MG tablet, Take 81 mg by mouth daily., Disp: , Rfl:  .  atorvastatin (LIPITOR) 10 MG tablet, Take 10 mg by mouth daily., Disp: , Rfl:  .  baclofen (LIORESAL) 10 MG tablet, TAKE 1 TABLET BY MOUTH IN THE MORNING AND 1 IN THE EVENING AND 2 AT BEDTIME (Patient taking differently: Take 10 mg by mouth See admin instructions. Take 10 mg by mouth in the morning, 10 mg in the evening and 20 mg at bedtime), Disp: 360 tablet, Rfl: 3 .  Cholecalciferol (VITAMIN D3) 125 MCG (5000 UT) CAPS, Take 5,000 Units by mouth every evening., Disp: , Rfl:  .  cilostazol (PLETAL) 100 MG tablet, Take 100 mg by mouth 2 (two) times daily. , Disp: , Rfl:  .  clotrimazole (LOTRIMIN) 1 % cream, Apply 1 application topically 2 (two) times daily as needed (fungal infections). For groin/toes , Disp: , Rfl:  .  Cyanocobalamin (VITAMIN B-12) 5000 MCG TBDP, Take 5,000 Units by mouth daily., Disp: , Rfl:  .  Dalfampridine (4-AMINOPYRIDINE) POWD, Take 5 mg by mouth 3 (three) times daily., Disp: 270 Bottle, Rfl: 3 .  lisinopril-hydrochlorothiazide (PRINZIDE,ZESTORETIC) 20-25 MG tablet,  Take 1 tablet by mouth daily., Disp: , Rfl: 4 .  ocrelizumab 600 mg in sodium chloride 0.9 % 500 mL, Inject 600 mg into the vein every 6 (six) months., Disp: , Rfl:  .  oxyCODONE (OXY IR/ROXICODONE) 5 MG immediate release tablet, Take 1 tablet (5 mg total) by mouth every 6 (six) hours as needed for severe pain., Disp: 120 tablet, Rfl: 0   Exam: Current vital signs: BP 128/77   Pulse (!) 59   Temp 98.9 F (37.2 C)   Resp 18   Wt 95.3 kg   SpO2 96%   BMI 30.57 kg/m  Vital signs in last 24 hours: Temp:  [98.9 F (37.2 C)] 98.9 F (37.2 C) (01/14 1235) Pulse Rate:  [59-66] 59 (01/14 1530) Resp:  [17-19] 18 (01/14 1530) BP: (109-128)/(75-77) 128/77 (01/14 1530) SpO2:  [94 %-97 %] 96 % (01/14 1530) Weight:  [95.3 kg] 95.3 kg (01/14 1237)  Physical Exam  Constitutional: Appears well-developed and well-nourished.  Psych: Affect appropriate to situation Eyes: No scleral injection HENT: No OP obstrucion Head: Normocephalic.  Cardiovascular: Normal rate and regular rhythm.  Respiratory: Effort normal, non-labored breathing GI: Soft.  No distension. There is no tenderness.  Skin: WDI  Neuro: Mental Status: Patient is awake, alert, oriented to person, place, month, year, and situation. Patient is able to give a clear and coherent history. No signs of aphasia or neglect Cranial Nerves: II: Visual Fields are full. Pupils are equal, round, and reactive to light.   III,IV, VI: EOMI without ptosis or diploplia.  V: Facial sensation is symmetric to temperature VII: Facial movement is symmetric.  VIII: hearing is intact to voice X: Uvula elevates symmetrically XI: Shoulder shrug is symmetric. XII: tongue is midline without atrophy or fasciculations.  Motor: Does not move b/l LE antigravity Wiggles toes b/l Sensory: Sensation is symmetric to light touch and temperature in the arms and legs.  Labs I have reviewed labs in epic and the results pertinent to this consultation  are:  Hyponatremia  CBC    Component Value Date/Time   WBC 6.8 03/08/2018 1354   RBC 4.34 03/08/2018 1354   HGB 15.2 03/08/2018 1354   HGB 17.0 07/27/2017 1255   HGB 17.8 (H) 04/20/2014 1054   HCT 43.3 03/08/2018 1354   HCT 48.4 07/27/2017 1255   HCT 54.3 (H) 04/20/2014 1054   PLT 152 03/08/2018 1354   PLT 248 07/27/2017 1255   MCV 99.8 03/08/2018 1354   MCV 100 (H) 07/27/2017 1255   MCV 101.3 (H) 04/20/2014 1054   MCH 35.0 (H) 03/08/2018 1354   MCHC 35.1 03/08/2018 1354   RDW 11.6 03/08/2018 1354   RDW 13.8 07/27/2017 1255   RDW 12.9 04/20/2014 1054   LYMPHSABS 0.5 (L) 03/08/2018 1354   LYMPHSABS 1.5 07/27/2017 1255   LYMPHSABS 1.0 04/20/2014 1054   MONOABS 0.7 03/08/2018 1354   MONOABS 0.6 04/20/2014 1054   EOSABS 0.2 03/08/2018 1354   EOSABS 0.1 07/27/2017 1255   BASOSABS 0.0 03/08/2018 1354   BASOSABS 0.0 07/27/2017 1255   BASOSABS 0.0 04/20/2014 1054  CMP     Component Value Date/Time   NA 123 (L) 03/08/2018 1354   K 3.7 03/08/2018 1354   CL 87 (L) 03/08/2018 1354   CO2 26 03/08/2018 1354   GLUCOSE 108 (H) 03/08/2018 1354   BUN 12 03/08/2018 1354   CREATININE 0.96 03/08/2018 1354   CALCIUM 8.2 (L) 03/08/2018 1354   PROT 6.9 03/08/2018 1354   PROT 7.9 12/09/2015 1519   ALBUMIN 3.8 03/08/2018 1354   ALBUMIN 4.6 12/09/2015 1519   AST 132 (H) 03/08/2018 1354   ALT 48 (H) 03/08/2018 1354   ALKPHOS 56 03/08/2018 1354   BILITOT 1.0 03/08/2018 1354   BILITOT 1.1 12/09/2015 1519   GFRNONAA >60 03/08/2018 1354   GFRAA >60 03/08/2018 1354    Lipid Panel  No results found for: CHOL, TRIG, HDL, CHOLHDL, VLDL, LDLCALC, LDLDIRECT   Imaging Previous MRIs and CXR reviewed  A/P: 68 yo M with hx PPMS on Ocrevus presenting s/p fall likely due to MS exacerbation due to the flu  Obtain MRI brain and spine with and without gad (Pt reports being due for repeat scans) Cont solu-medrol for total 5 doses if new lesions on MRI Supportive care for the  flu Hyponatremia noted (likely due to dehydration) PT eval  Total time spent 42min

## 2018-03-08 NOTE — ED Provider Notes (Signed)
[facility] DEPT Provider Note   CSN: [id number] Arrival date & time: 03/08/18  1223   History   Chief Complaint Chief Complaint  Patient presents with  . Cough  . Diarrhea  . Fall    HPI Vincent Evans is a 68 y.o. male with a PMH of MS presenting via EMS from home after a fall. Patient reports worsening lower extremity weakness, productive cough, diarrhea, and congestion onset 3-4 days ago. Patient's wife is a contributing historian. Patient reports he was in the bedroom attempting to use the bathroom when he was unable to stand up and fell backwards. Patient reports he hit his head, but denies LOC. Patient denies headaches, vision changes, neck pain, or using blood thinners. Patient reports he typically uses a wheelchair and does not ambulate. Patient states he is usually able to stand up with assistance, but today the weakness was worse. Patient reports taking imodium, robitussin, and alka seltzer for his symptoms. Patient reports 3-4 episodes of diarrhea and denies blood in stool. Patient reports associated shortness of breath and is unsure about a fever. Patient is unsure if he has had sick contacts. Patient reports he was diagnosed with primary progressive MS in 2013 and is being followed by Dr. Felecia Shelling in [facility].   HPI  Past Medical History:  Diagnosis Date  . Anxiety 12/22/2013  . Hearing loss   . Movement disorder   . Multiple sclerosis ([place])   . Neuromuscular disorder ([place])    MS  . 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 Vision abnormalities     Patient Active Problem List   Diagnosis Date Noted  . MS (multiple sclerosis) ([place]) 03/08/2018  . Weakness 03/08/2018  . Constipation 01/26/2017  . 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 Right sided sciatica 07/12/2015  . Left foot drop 07/12/2014  . Multiple sclerosis ([place]) 03/13/2014  . Spastic gait 03/13/2014  . Tobacco abuse counseling 03/13/2014  . Urinary  urgency 03/13/2014  . Anxiety 12/22/2013  . Smoking 12/09/2013  . 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 BUNIONECTOMY          Home Medications    Prior to Admission medications   Medication Sig Start Date End Date Taking? Authorizing Provider  aspirin 81 MG tablet Take 81 mg by mouth daily.   Yes [provider]  atorvastatin (LIPITOR) 10 MG tablet Take 10 mg by mouth daily.   Yes [provider]  baclofen (LIORESAL) 10 MG tablet TAKE 1 TABLET BY MOUTH IN THE MORNING AND 1 IN THE EVENING AND 2 AT BEDTIME Patient taking differently: Take 10 mg by mouth See admin instructions. Take 10 mg by mouth in the morning, 10 mg in the evening and 20 mg at bedtime 09/13/17  Yes Sater, Nanine Means, MD  Cholecalciferol (VITAMIN D3) 125 MCG (5000 UT) CAPS Take 5,000 Units by mouth every evening.   Yes [provider]  cilostazol (PLETAL) 100 MG tablet Take 100 mg by mouth 2 (two) times daily.  08/17/16  Yes [provider]  clotrimazole (LOTRIMIN) 1 % cream Apply 1 application topically 2 (two) times daily as needed (fungal infections). For groin/toes    Yes [provider]  Cyanocobalamin (VITAMIN B-12) 5000 MCG TBDP Take 5,000 Units by mouth daily.   Yes [provider]  Dalfampridine (4-AMINOPYRIDINE) POWD Take 5 mg by mouth 3 (three) times daily. 09/20/17  Yes Sater, Nanine Means, MD  lisinopril-hydrochlorothiazide Reita May)  20-25 MG tablet Take 1 tablet by mouth daily. 01/14/18  Yes [provider]  ocrelizumab 600 mg in sodium chloride 0.9 % 500 mL Inject 600 mg into the vein every 6 (six) months.   Yes [provider]  oxyCODONE (OXY IR/ROXICODONE) 5 MG immediate release tablet Take 1 tablet (5 mg total) by mouth every 6 (six) hours as needed for severe pain. 01/27/18  Yes Sater, Nanine Means, MD    Family History Family History  Problem Relation Age of Onset  .  Dementia Mother   . Diabetes type II Father   . Hypertension Father   . Cancer Maternal Grandmother        pancreatic ca    Social History Social History   Tobacco Use  . Smoking status: Current Every Day Smoker    Packs/day: 0.75    Years: 35.00    Pack years: 26.25    Types: Cigarettes  . Smokeless tobacco: Never Used  Substance Use Topics  . Alcohol use: Yes    Alcohol/week: 0.0 standard drinks    Comment: 3 to 6 oz. of Bourbon daily, and an occasional beer./fim  . Drug use: No     Allergies   Patient has no known allergies.   Review of Systems Review of Systems  Constitutional: Positive for fatigue. Negative for activity change and appetite change.  HENT: Positive for congestion, rhinorrhea and sore throat. Negative for ear pain and postnasal drip.   Eyes: Negative for pain, redness, itching and visual disturbance.  Respiratory: Positive for cough and shortness of breath.   Cardiovascular: Negative for chest pain.  Gastrointestinal: Negative for abdominal pain, diarrhea, nausea and vomiting.  Musculoskeletal: Positive for gait problem. Negative for arthralgias, back pain, joint swelling, myalgias, neck pain and neck stiffness.  Skin: Negative for rash.  Allergic/Immunologic: Negative for environmental allergies.  Neurological: Positive for weakness. Negative for dizziness, syncope, speech difficulty, numbness and headaches.     Physical Exam Updated Vital Signs BP 131/80   Pulse (!) 52   Temp 98.9 F (37.2 C)   Resp 19   Wt 95.3 kg   SpO2 91%   BMI 30.57 kg/m   Physical Exam Vitals signs and nursing note reviewed.  Constitutional:      General: He is not in acute distress.    Appearance: He is well-developed. He is not diaphoretic.  HENT:     Head: Normocephalic and atraumatic.     Nose: Congestion and rhinorrhea present.     Mouth/Throat:     Pharynx: No oropharyngeal exudate or posterior oropharyngeal erythema.  Cardiovascular:     Rate and  Rhythm: Normal rate and regular rhythm.     Heart sounds: Normal heart sounds. No murmur. No friction rub. No gallop.   Pulmonary:     Effort: Pulmonary effort is normal. No respiratory distress.     Breath sounds: Normal breath sounds. No wheezing or rales.  Abdominal:     Palpations: Abdomen is soft.     Tenderness: There is no abdominal tenderness.  Musculoskeletal: Normal range of motion.  Skin:    General: Skin is warm.     Findings: No erythema or rash.  Neurological:     Mental Status: He is alert and oriented to person, place, and time.   Mental Status:  Alert, oriented, thought content appropriate, able to give a coherent history. Speech fluent without evidence of aphasia. Able to follow 2 step commands without difficulty.  Cranial Nerves:  II:  Peripheral visual fields grossly normal, pupils equal, round, reactive to light III,IV, VI: ptosis not present, extra-ocular motions intact bilaterally  V,VII: smile symmetric, facial light touch sensation equal VIII: hearing grossly normal to voice  X: uvula elevates symmetrically  XI: bilateral shoulder shrug symmetric and strong XII: midline tongue extension without fassiculations Motor:  Normal tone. Strength is 3/5 in lower extremities bilaterally with  dorsiflexion/plantar flexion. Strength is 5/5 in upper extremities with equal grip strength.  Sensory: light touch normal in all extremities.  Deep Tendon Reflexes: 3+ and symmetric in the biceps and patella. Cerebellar: normal finger-to-nose with bilateral upper extremities Gait: unable to ambulate due to weakness. CV: distal pulses palpable throughout    ED Treatments / Results  Labs (all labs ordered are listed, but only abnormal results are displayed) Labs Reviewed  COMPREHENSIVE METABOLIC PANEL - Abnormal; Notable for the following components:      Result Value   Sodium 123 (*)    Chloride 87 (*)    Glucose, Bld 108 (*)    Calcium 8.2 (*)    AST 132 (*)    ALT 48  (*)    All other components within normal limits  CBC WITH DIFFERENTIAL/PLATELET - Abnormal; Notable for the following components:   MCH 35.0 (*)    Lymphs Abs 0.5 (*)    All other components within normal limits  INFLUENZA PANEL BY PCR (TYPE A & B) - Abnormal; Notable for the following components:   Influenza A By PCR POSITIVE (*)    All other components within normal limits  URINALYSIS, ROUTINE W REFLEX MICROSCOPIC - Abnormal; Notable for the following components:   Ketones, ur 5 (*)    All other components within normal limits  MAGNESIUM    EKG None  Radiology Dg Chest 2 View  Result Date: 03/08/2018 CLINICAL DATA:  Cough, congestion, nausea, vomiting, diarrhea and weakness for 3 days. EXAM: CHEST - 2 VIEW COMPARISON:  PA and lateral chest 09/01/2016 and 03/20/2009. CT chest 12/28/2005. FINDINGS: The lungs are hyperexpanded and there is peribronchial thickening. No consolidative process, pneumothorax or effusion. Aortic atherosclerosis is noted. Heart size is normal. No acute bony abnormality. Remote healed left clavicle fracture is seen. IMPRESSION: No acute disease. Findings compatible with COPD. Atherosclerosis. Electronically Signed   By: Inge Rise M.D.   On: 03/08/2018 13:47    Procedures Procedures (including critical care time)  Medications Ordered in ED Medications  sodium chloride 0.9 % bolus 1,000 mL (0 mLs Intravenous Stopped 03/08/18 1531)  methylPREDNISolone sodium succinate (SOLU-MEDROL) 1,000 mg in sodium chloride 0.9 % 50 mL IVPB (0 mg Intravenous Stopped 03/08/18 1658)     Initial Impression / Assessment and Plan / ED Course  I have reviewed the triage vital signs and the nursing notes.  Pertinent labs & imaging results that were available during my care of the patient were reviewed by me and considered in my medical decision making (see chart for details).  Clinical Course as of Mar 09 1823  Tue Mar 08, 2018  1424 No acute findings on CXR noted.  DG  Chest 2 View [AH]  1606 Positive for Influenza A.  Influenza A By PCR(!): POSITIVE [AH]    Clinical Course User Index [AH] Arville Lime, PA-C    Suspect symptoms are likely due to an exacerbation of multiple sclerosis due to history and physical exam. Influenza test is positive for influenza A. CXR does not reveal any acute findings. Provided IVF and solu-medrol. Consulted neurology  and neurology agreed to evaluate the patient. Dr. Marcelle Overlie recommends MRI, admission for further evaluation, and supportive treatment for influenza. Neurology advised continuing solu-medrol if MRI reveals new lesions. Neurology states they will continue to follow up with patient tomorrow at [facility]. Patient will require admission due to possible exacerbation of MS and debility. Consulted hospitalist and hospitalist has agreed at admit patient.   Findings and plan of care discussed with supervising physician Dr. Kathrynn Humble.  Final Clinical Impressions(s) / ED Diagnoses   Final diagnoses:  Fall, initial encounter  Influenza A  Debility    ED Discharge Orders    None       Arville Lime, [place] 03/08/18 [place], Ankit, MD 03/09/18 [phone]

## 2018-03-08 NOTE — ED Notes (Signed)
ED TO INPATIENT HANDOFF REPORT  Name/Age/Gender Vincent Evans Rm 68 y.o. male  Code Status Advance Directive Documentation     Most Recent Value  Type of Advance Directive  Healthcare Power of Attorney, Living will  Pre-existing out of facility DNR order (yellow form or pink MOST form)  -  "MOST" Form in Place?  -      Home/SNF/Other Home  Chief Complaint Fall; Flu Sx; MS  Level of Care/Admitting Diagnosis ED Disposition    ED Disposition Condition [place]: [facility] [[id number]]  Level of Care: Med-Surg [16]  Diagnosis: MS (multiple sclerosis) ([place]) [[id number]]  Admitting Physician: Elwyn Reach [[id number]]  Attending Physician: Elwyn Reach [[id number]]  Estimated length of stay: past midnight tomorrow  Certification:: I certify this patient will need inpatient services for at least 2 midnights  PT Class (Do Not Modify): Inpatient [101]  PT Acc Code (Do Not Modify): Private [1]       Medical History Past Medical History:  Diagnosis Date  . Anxiety 12/22/2013  . Hearing loss   . Movement disorder   . Multiple sclerosis ([place])   . Neuromuscular disorder ([place])    MS  . 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 Vision abnormalities     Allergies No Known Allergies  IV Location/Drains/Wounds Patient Lines/Drains/Airways Status   Active Line/Drains/Airways    Name:   Placement date:   Placement time:   Site:   Days:   Peripheral IV 03/08/18 Left Antecubital   03/08/18    1353    Antecubital   less than 1          Labs/Imaging Results for orders placed or performed during the hospital encounter of 03/08/18 (from the past 48 hour(s))  Comprehensive metabolic panel     Status: Abnormal   Collection Time: 03/08/18  1:54 PM  Result Value Ref Range   Sodium 123 (L) 135 - 145 mmol/L   Potassium 3.7 3.5 - 5.1 mmol/L   Chloride 87 (L) 98 - 111 mmol/L   CO2 26 22 - 32 mmol/L   Glucose, Bld 108 (H) 70 - 99 mg/dL   BUN 12 8 - 23  mg/dL   Creatinine, Ser 0.96 0.61 - 1.24 mg/dL   Calcium 8.2 (L) 8.9 - 10.3 mg/dL   Total Protein 6.9 6.5 - 8.1 g/dL   Albumin 3.8 3.5 - 5.0 g/dL   AST 132 (H) 15 - 41 U/L   ALT 48 (H) 0 - 44 U/L   Alkaline Phosphatase 56 38 - 126 U/L   Total Bilirubin 1.0 0.3 - 1.2 mg/dL   GFR calc non Af Amer >60 >60 mL/min   GFR calc Af Amer >60 >60 mL/min   Anion gap 10 5 - 15    Comment: Performed at [facility], [place] [address] [place], [place] [address]  CBC with Differential     Status: Abnormal   Collection Time: 03/08/18  1:54 PM  Result Value Ref Range   WBC 6.8 4.0 - 10.5 K/uL   RBC 4.34 4.22 - 5.81 MIL/uL   Hemoglobin 15.2 13.0 - 17.0 g/dL   HCT 43.3 39.0 - 52.0 %   MCV 99.8 80.0 - 100.0 fL   MCH 35.0 (H) 26.0 - 34.0 pg   MCHC 35.1 30.0 - 36.0 g/dL   RDW 11.6 11.5 - 15.5 %   Platelets 152 150 - 400 K/uL   nRBC 0.0 0.0 - 0.2 %  Neutrophils Relative % 78 %   Neutro Abs 5.4 1.7 - 7.7 K/uL   Lymphocytes Relative 7 %   Lymphs Abs 0.5 (L) 0.7 - 4.0 K/uL   Monocytes Relative 11 %   Monocytes Absolute 0.7 0.1 - 1.0 K/uL   Eosinophils Relative 3 %   Eosinophils Absolute 0.2 0.0 - 0.5 K/uL   Basophils Relative 0 %   Basophils Absolute 0.0 0.0 - 0.1 K/uL   Immature Granulocytes 1 %   Abs Immature Granulocytes 0.05 0.00 - 0.07 K/uL    Comment: Performed at [facility], [place] [address] [place], [place] [address]  Influenza panel by PCR (type A & B)     Status: Abnormal   Collection Time: 03/08/18  1:54 PM  Result Value Ref Range   Influenza A By PCR POSITIVE (A) NEGATIVE   Influenza B By PCR NEGATIVE NEGATIVE    Comment: (NOTE) The Xpert Xpress Flu assay is intended as an aid in the diagnosis of  influenza and should not be used as a sole basis for treatment.  This  assay is FDA approved for nasopharyngeal swab specimens only. Nasal  washings and aspirates are unacceptable for Xpert Xpress Flu testing. Performed at [facility], [place] [address] [place], [place] [address]   Magnesium     Status: None   Collection Time: 03/08/18  1:54 PM  Result Value Ref Range   Magnesium 1.9 1.7 - 2.4 mg/dL    Comment: Performed at [facility], [place] [address], [place], [place] [address]  Urinalysis, Routine w reflex microscopic     Status: Abnormal   Collection Time: 03/08/18  4:48 PM  Result Value Ref Range   Color, Urine YELLOW YELLOW   APPearance CLEAR CLEAR   Specific Gravity, Urine 1.015 1.005 - 1.030   pH 6.0 5.0 - 8.0   Glucose, UA NEGATIVE NEGATIVE mg/dL   Hgb urine dipstick NEGATIVE NEGATIVE   Bilirubin Urine NEGATIVE NEGATIVE   Ketones, ur 5 (A) NEGATIVE mg/dL   Protein, ur NEGATIVE NEGATIVE mg/dL   Nitrite NEGATIVE NEGATIVE   Leukocytes, UA NEGATIVE NEGATIVE    Comment: Performed at [place] [address] [place], [place] [address]   Dg Chest 2 View  Result Date: 03/08/2018 CLINICAL DATA:  Cough, congestion, nausea, vomiting, diarrhea and weakness for 3 days. EXAM: CHEST - 2 VIEW COMPARISON:  PA and lateral chest 09/01/2016 and 03/20/2009. CT chest 12/28/2005. FINDINGS: The lungs are hyperexpanded and there is peribronchial thickening. No consolidative process, pneumothorax or effusion. Aortic atherosclerosis is noted. Heart size is normal. No acute bony abnormality. Remote healed left clavicle fracture is seen. IMPRESSION: No acute disease. Findings compatible with COPD. Atherosclerosis. Electronically Signed   By: Inge Rise M.D.   On: 03/08/2018 13:47   None  Pending Labs Unresulted Labs (From admission, onward)   None      Vitals/Pain Today's Vitals   03/08/18 1352 03/08/18 1530 03/08/18 1700 03/08/18 1830  BP: 109/75 128/77 131/80 128/76  Pulse: 66 (!) 59 (!) 52 (!) 50  Resp: 19 18 19 17   Temp:      SpO2: 94% 96% 91% 94%  Weight:      PainSc:        Isolation Precautions Droplet precaution  Medications Medications  sodium  chloride 0.9 % bolus 1,000 mL (0 mLs Intravenous Stopped 03/08/18 1531)  methylPREDNISolone sodium succinate (SOLU-MEDROL) 1,000 mg in sodium chloride 0.9 % 50 mL IVPB (  0 mg Intravenous Stopped 03/08/18 1658)    Mobility non-ambulatory

## 2018-03-08 NOTE — ED Triage Notes (Signed)
Per EMS, patient from home, c/o cough, congestion, N/V/D x3 days. Hx MS. Reports increased weakness x3 days. Can stand at baseline, today patient fell attempting to stand. A&Ox4. Reports hitting his head. Denies dizziness, LOC and blood thinners. C-collar in place.   BP 118/70 HR 60 RR 16

## 2018-03-08 NOTE — ED Notes (Signed)
Patient transported to X-ray 

## 2018-03-08 NOTE — ED Notes (Signed)
Neurology at bedside.

## 2018-03-08 NOTE — H&P (Addendum)
History and Physical   Vincent Evans QQV:[id number] DOB: 1950-06-23 DOA: 03/08/2018  Referring MD/NP/PA: Dr Alvino Chapel  PCP: Lawerance Cruel, MD   Outpatient Specialists: Dr. Felecia Shelling, [facility]  Patient coming from: Home  Chief Complaint: Generalized weakness cough diarrhea and fall  HPI: Vincent Evans is a 68 y.o. male with medical history significant of progressive multiple sclerosis, hypertension, anxiety disorder, tobacco abuse, recurrent falls, spastic gait, secondary erythrocytosis who has been on treatment and followed by neurology constantly but has had progressive weakness over the last few weeks.  He gets ocrelizumab injections every 6 months the last one was in September.  Patient came to the ER where he appeared to be weak.  Reported diarrhea.  Also cough and then having a fall.  EMS brought him because of the fall.He could not stand and walk on his own.  Wife said he has been having cough diarrhea congestion for the last 3 to 4 days.  Acute viral illness was suspected.  So far viral assay has been positive for influenza A. Neurology consulted and suspicion for possible flareup of his MS has been made.  Patient is being admitted to the hospital for supportive care.  ED Course: Vitals appear to be stable.  Sodium is 123 potassium 3.7 chloride 87.  BUN and creatinine are normal calcium 8.2 and glucose 108.  CBC also appears to be all within normal.  Urinalysis also is normal.  MRI of the brain cervical spine and thoracic spine were done.  But not completed at patient's request.  The noncontrasted portion showed multiple demyelinating lesions.  Neurology was consulted and recommended admission.  Steroids started and they will reassess in the morning.  Review of Systems: As per HPI otherwise 10 point review of systems negative.    Past Medical History:  Diagnosis Date  . Anxiety 12/22/2013  . Hearing loss   . Movement disorder   . Multiple sclerosis ([place])   .  Neuromuscular disorder ([place])    MS  . 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 Vision abnormalities     Past Surgical History:  Procedure Laterality Date  . BUNIONECTOMY       reports that he has been smoking cigarettes. He has a 26.25 pack-year smoking history. He has never used smokeless tobacco. He reports current alcohol use. He reports that he does not use drugs.  No Known Allergies  Family History  Problem Relation Age of Onset  . Dementia Mother   . Diabetes type II Father   . Hypertension Father   . Cancer Maternal Grandmother        pancreatic ca     Prior to Admission medications   Medication Sig Start Date End Date Taking? Authorizing Provider  aspirin 81 MG tablet Take 81 mg by mouth daily.   Yes [provider]  atorvastatin (LIPITOR) 10 MG tablet Take 10 mg by mouth daily.   Yes [provider]  baclofen (LIORESAL) 10 MG tablet TAKE 1 TABLET BY MOUTH IN THE MORNING AND 1 IN THE EVENING AND 2 AT BEDTIME Patient taking differently: Take 10 mg by mouth See admin instructions. Take 10 mg by mouth in the morning, 10 mg in the evening and 20 mg at bedtime 09/13/17  Yes Sater, Nanine Means, MD  Cholecalciferol (VITAMIN D3) 125 MCG (5000 UT) CAPS Take 5,000 Units by mouth every evening.   Yes [provider]  cilostazol (PLETAL) 100 MG tablet Take 100 mg by mouth 2 (two) times daily.  08/17/16  Yes [provider]  clotrimazole (LOTRIMIN) 1 % cream Apply 1 application topically 2 (two) times daily as needed (fungal infections). For groin/toes    Yes [provider]  Cyanocobalamin (VITAMIN B-12) 5000 MCG TBDP Take 5,000 Units by mouth daily.   Yes [provider]  Dalfampridine (4-AMINOPYRIDINE) POWD Take 5 mg by mouth 3 (three) times daily. 09/20/17  Yes Sater, Nanine Means, MD  lisinopril-hydrochlorothiazide (PRINZIDE,ZESTORETIC) 20-25 MG tablet Take 1 tablet by mouth daily. 01/14/18  Yes [provider]  ocrelizumab 600  mg in sodium chloride 0.9 % 500 mL Inject 600 mg into the vein every 6 (six) months.   Yes [provider]  oxyCODONE (OXY IR/ROXICODONE) 5 MG immediate release tablet Take 1 tablet (5 mg total) by mouth every 6 (six) hours as needed for severe pain. 01/27/18  Yes Britt Bottom, MD    Physical Exam: Vitals:   03/08/18 1700 03/08/18 1830 03/08/18 1941 03/08/18 1943  BP: 131/80 128/76  115/77  Pulse: (!) 52 (!) 50  (!) 57  Resp: 19 17  16   Temp:    98.9 F (37.2 C)  TempSrc:    Oral  SpO2: 91% 94%  93%  Weight:   95.3 kg   Height:   5\' 9"  (1.753 m)       Constitutional: NAD, calm, comfortable, global weakness Vitals:   03/08/18 1700 03/08/18 1830 03/08/18 1941 03/08/18 1943  BP: 131/80 128/76  115/77  Pulse: (!) 52 (!) 50  (!) 57  Resp: 19 17  16   Temp:    98.9 F (37.2 C)  TempSrc:    Oral  SpO2: 91% 94%  93%  Weight:   95.3 kg   Height:   5\' 9"  (1.753 m)    Eyes: PERRL, lids and conjunctivae normal ENMT: Mucous membranes are moist. Posterior pharynx clear of any exudate or lesions.Normal dentition.  Neck: normal, supple, no masses, no thyromegaly Respiratory: clear to auscultation bilaterally, no wheezing, no crackles. Normal respiratory effort. No accessory muscle use.  Cardiovascular: Regular rate and rhythm, no murmurs / rubs / gallops. No extremity edema. 2+ pedal pulses. No carotid bruits.  Abdomen: no tenderness, no masses palpated. No hepatosplenomegaly. Bowel sounds positive.  Musculoskeletal: no clubbing / cyanosis. No joint deformity upper and lower extremities. Good ROM, no contractures. Normal muscle tone.  Skin: no rashes, lesions, ulcers. No induration Neurologic: CN 2-12 grossly intact. Sensation intact, DTR normal. Strength 5/5 in all 4.  Psychiatric: Normal judgment and insight. Alert and oriented x 3. Normal mood.     Labs on Admission: I have personally reviewed following labs and imaging studies  CBC: Recent Labs  Lab 03/08/18 1354    WBC 6.8  NEUTROABS 5.4  HGB 15.2  HCT 43.3  MCV 99.8  PLT 650   Basic Metabolic Panel: Recent Labs  Lab 03/08/18 1354  NA 123*  K 3.7  CL 87*  CO2 26  GLUCOSE 108*  BUN 12  CREATININE 0.96  CALCIUM 8.2*  MG 1.9   GFR: Estimated Creatinine Clearance: 85 mL/min (by C-G formula based on SCr of 0.96 mg/dL). Liver Function Tests: Recent Labs  Lab 03/08/18 1354  AST 132*  ALT 48*  ALKPHOS 56  BILITOT 1.0  PROT 6.9  ALBUMIN 3.8   No results for input(s): LIPASE, AMYLASE in the last 168 hours. No results for input(s): AMMONIA in the last 168 hours. Coagulation Profile: No results for input(s): INR, PROTIME in the last 168  hours. Cardiac Enzymes: No results for input(s): CKTOTAL, CKMB, CKMBINDEX, TROPONINI in the last 168 hours. BNP (last 3 results) No results for input(s): PROBNP in the last 8760 hours. HbA1C: No results for input(s): HGBA1C in the last 72 hours. CBG: No results for input(s): GLUCAP in the last 168 hours. Lipid Profile: No results for input(s): CHOL, HDL, LDLCALC, TRIG, CHOLHDL, LDLDIRECT in the last 72 hours. Thyroid Function Tests: No results for input(s): TSH, T4TOTAL, FREET4, T3FREE, THYROIDAB in the last 72 hours. Anemia Panel: No results for input(s): VITAMINB12, FOLATE, FERRITIN, TIBC, IRON, RETICCTPCT in the last 72 hours. Urine analysis:    Component Value Date/Time   COLORURINE YELLOW 03/08/2018 1648   APPEARANCEUR CLEAR 03/08/2018 1648   LABSPEC 1.015 03/08/2018 1648   PHURINE 6.0 03/08/2018 1648   GLUCOSEU NEGATIVE 03/08/2018 1648   HGBUR NEGATIVE 03/08/2018 1648   BILIRUBINUR NEGATIVE 03/08/2018 1648   KETONESUR 5 (A) 03/08/2018 1648   PROTEINUR NEGATIVE 03/08/2018 1648   NITRITE NEGATIVE 03/08/2018 1648   LEUKOCYTESUR NEGATIVE 03/08/2018 1648   Sepsis Labs: @LABRCNTIP (procalcitonin:4,lacticidven:4) )No results found for this or any previous visit (from the past 240 hour(s)).   Radiological Exams on Admission: Dg Chest 2  View  Result Date: 03/08/2018 CLINICAL DATA:  Cough, congestion, nausea, vomiting, diarrhea and weakness for 3 days. EXAM: CHEST - 2 VIEW COMPARISON:  PA and lateral chest 09/01/2016 and 03/20/2009. CT chest 12/28/2005. FINDINGS: The lungs are hyperexpanded and there is peribronchial thickening. No consolidative process, pneumothorax or effusion. Aortic atherosclerosis is noted. Heart size is normal. No acute bony abnormality. Remote healed left clavicle fracture is seen. IMPRESSION: No acute disease. Findings compatible with COPD. Atherosclerosis. Electronically Signed   By: Inge Rise M.D.   On: 03/08/2018 13:47   Mr Brain Wo Contrast  Result Date: 03/08/2018 CLINICAL DATA:  67 year old male with multiple sclerosis, positive for flu. Increased weakness and fall. Studies without and with contrast were planned, but the examination had to be discontinued prior to completion due to patient request, and no contrast was administered. EXAM: MRI HEAD WITHOUT CONTRAST MRI CERVICAL SPINE WITHOUT CONTRAST TECHNIQUE: Multiplanar, multiecho pulse sequences of the brain and surrounding structures, and cervical spine, to include the craniocervical junction and cervicothoracic junction, were obtained without intravenous contrast. COMPARISON:  Brain MRI 03/25/2015, 10/07/2012. No prior cervical spine MRI FINDINGS: MRI HEAD FINDINGS Brain: No restricted diffusion to suggest acute infarction. No midline shift, mass effect, evidence of mass lesion, ventriculomegaly, extra-axial collection or acute intracranial hemorrhage. Cervicomedullary junction and pituitary are within normal limits. Chronic severe volume loss of the corpus callosum and anterior frontal lobes. Underlying generalized cerebral volume loss. Chronic cortical and white matter T2 and FLAIR hyperintensity along the right cingulate and superior frontal gyri. Chronic confluent periventricular white matter T2 and FLAIR hyperintensity with occasional nodularity.  The signal changes appear stable since 2017. No new or restricted white matter lesion is identified. Superimposed mild T2 heterogeneity in the deep gray matter nuclei, greater on the left, is stable. But confluent T2 hyperintensity in the pons has progressed (series 8, images 9 and 10). Subtle FLAIR hyperintensity in the cerebellar peduncles best seen on sagittal FLAIR has not significantly changed. The cerebellum is otherwise spared. No chronic cerebral blood products. No other cortical encephalomalacia identified. Vascular: Major intracranial vascular flow voids are stable since 2017. The right vertebral artery is dominant. Skull and upper cervical spine: Normal bone marrow signal. Sinuses/Orbits: Orbits soft tissues appear stable and grossly normal. Mild paranasal sinus mucosal thickening has  not significantly changed. Other: Mastoids are clear. Visible internal auditory structures appear normal. Scalp and face soft tissues appear negative. MRI CERVICAL SPINE FINDINGS Alignment: Mild straightening of cervical lordosis. Vertebrae: No marrow edema or evidence of acute osseous abnormality. Visualized bone marrow signal is within normal limits. Cord: Multifocal abnormal cervical spinal cord signal compatible with demyelinating disease. Dominant lesions are identified on series 13, image 7 including the right ventral cord at C2, right hemi cord at C3-C4, and also in the visible upper thoracic spine left hemi cord at T1. No cord expansion, but rather somewhat decreased cord volume. No contrast administered. Posterior Fossa, vertebral arteries, paraspinal tissues: Cervicomedullary junction is within normal limits. Preserved major vascular flow voids in the neck. Negative neck soft tissues. Disc levels: Superimposed significant degenerative changes including C3-C4: Circumferential disc bulge and endplate spurring affecting the neural foramina with moderate bilateral C4 foraminal stenosis. C4-C5: Right eccentric disc  bulge and endplate spurring with moderate right C5 foraminal stenosis. C5-C6: Disc space loss with circumferential disc bulge and endplate spurring eccentric to the left. Borderline to mild spinal stenosis. Severe left and moderate right C6 foraminal stenosis. C6-C7: Left eccentric disc bulge and endplate spurring. Moderate to severe left C7 foraminal stenosis. IMPRESSION: 1. The examination was discontinued prior to completion by patient request, and no contrast was administered. 2. Progressed signal abnormality in the pons since 2017, but no active demyelination in the brain identified by diffusion. 3. Otherwise stable intracranial demyelinating disease, including pronounced involvement of the anterior right frontal lobe 4. Multifocal cervical and upper thoracic spinal cord demyelinating lesions, age indeterminate but perhaps chronic on the basis of associated spinal cord volume loss. 5. Superimposed cervical spine degeneration primarily resulting in neural foraminal stenosis. Electronically Signed   By: Genevie Ann M.D.   On: 03/08/2018 21:31   Mr Cervical Spine Wo Contrast  Result Date: 03/08/2018 CLINICAL DATA:  68 year old male with multiple sclerosis, positive for flu. Increased weakness and fall. Studies without and with contrast were planned, but the examination had to be discontinued prior to completion due to patient request, and no contrast was administered. EXAM: MRI HEAD WITHOUT CONTRAST MRI CERVICAL SPINE WITHOUT CONTRAST TECHNIQUE: Multiplanar, multiecho pulse sequences of the brain and surrounding structures, and cervical spine, to include the craniocervical junction and cervicothoracic junction, were obtained without intravenous contrast. COMPARISON:  Brain MRI 03/25/2015, 10/07/2012. No prior cervical spine MRI FINDINGS: MRI HEAD FINDINGS Brain: No restricted diffusion to suggest acute infarction. No midline shift, mass effect, evidence of mass lesion, ventriculomegaly, extra-axial collection or  acute intracranial hemorrhage. Cervicomedullary junction and pituitary are within normal limits. Chronic severe volume loss of the corpus callosum and anterior frontal lobes. Underlying generalized cerebral volume loss. Chronic cortical and white matter T2 and FLAIR hyperintensity along the right cingulate and superior frontal gyri. Chronic confluent periventricular white matter T2 and FLAIR hyperintensity with occasional nodularity. The signal changes appear stable since 2017. No new or restricted white matter lesion is identified. Superimposed mild T2 heterogeneity in the deep gray matter nuclei, greater on the left, is stable. But confluent T2 hyperintensity in the pons has progressed (series 8, images 9 and 10). Subtle FLAIR hyperintensity in the cerebellar peduncles best seen on sagittal FLAIR has not significantly changed. The cerebellum is otherwise spared. No chronic cerebral blood products. No other cortical encephalomalacia identified. Vascular: Major intracranial vascular flow voids are stable since 2017. The right vertebral artery is dominant. Skull and upper cervical spine: Normal bone marrow signal. Sinuses/Orbits: Orbits soft  tissues appear stable and grossly normal. Mild paranasal sinus mucosal thickening has not significantly changed. Other: Mastoids are clear. Visible internal auditory structures appear normal. Scalp and face soft tissues appear negative. MRI CERVICAL SPINE FINDINGS Alignment: Mild straightening of cervical lordosis. Vertebrae: No marrow edema or evidence of acute osseous abnormality. Visualized bone marrow signal is within normal limits. Cord: Multifocal abnormal cervical spinal cord signal compatible with demyelinating disease. Dominant lesions are identified on series 13, image 7 including the right ventral cord at C2, right hemi cord at C3-C4, and also in the visible upper thoracic spine left hemi cord at T1. No cord expansion, but rather somewhat decreased cord volume. No  contrast administered. Posterior Fossa, vertebral arteries, paraspinal tissues: Cervicomedullary junction is within normal limits. Preserved major vascular flow voids in the neck. Negative neck soft tissues. Disc levels: Superimposed significant degenerative changes including C3-C4: Circumferential disc bulge and endplate spurring affecting the neural foramina with moderate bilateral C4 foraminal stenosis. C4-C5: Right eccentric disc bulge and endplate spurring with moderate right C5 foraminal stenosis. C5-C6: Disc space loss with circumferential disc bulge and endplate spurring eccentric to the left. Borderline to mild spinal stenosis. Severe left and moderate right C6 foraminal stenosis. C6-C7: Left eccentric disc bulge and endplate spurring. Moderate to severe left C7 foraminal stenosis. IMPRESSION: 1. The examination was discontinued prior to completion by patient request, and no contrast was administered. 2. Progressed signal abnormality in the pons since 2017, but no active demyelination in the brain identified by diffusion. 3. Otherwise stable intracranial demyelinating disease, including pronounced involvement of the anterior right frontal lobe 4. Multifocal cervical and upper thoracic spinal cord demyelinating lesions, age indeterminate but perhaps chronic on the basis of associated spinal cord volume loss. 5. Superimposed cervical spine degeneration primarily resulting in neural foraminal stenosis. Electronically Signed   By: Genevie Ann M.D.   On: 03/08/2018 21:31    Assessment/Plan Principal Problem:   MS (multiple sclerosis) ([place]) Active Problems:   Weakness   Influenza due to identified novel influenza A virus with other respiratory manifestations     #1 generalized weakness: Most likely reflective of worsening MS symptoms.  Could also be acute viral illness.  Could be dehydration.  Patient will be treated symptomatically.  Hydrate and then order PT and OT in the morning.  #2 MS flare:  Suspected flare of his multiple sclerosis.  Neurology aware.  Started on Solu-Medrol.  Defer care to neurology.  #3 influenza A positive: Patient's influenza test just came back showing positive result.  We will initiate Tamiflu and follow closely.  #4 tobacco abuse: Counseling provided.  We will initiate nicotine patch.   DVT prophylaxis: Heparin Code Status: Full code Family Communication: Wife at bedside Disposition Plan: Home Consults called: Neurology Dr.Osias Admission status: Inpatient  Severity of Illness: The appropriate patient status for this patient is INPATIENT. Inpatient status is judged to be reasonable and necessary in order to provide the required intensity of service to ensure the patient's safety. The patient's presenting symptoms, physical exam findings, and initial radiographic and laboratory data in the context of their chronic comorbidities is felt to place them at high risk for further clinical deterioration. Furthermore, it is not anticipated that the patient will be medically stable for discharge from the hospital within 2 midnights of admission. The following factors support the patient status of inpatient.   " The patient's presenting symptoms include generalized weakness. " The worrisome physical exam findings include generalized muscle weakness. " The initial radiographic  and laboratory data are worrisome because of positive influenza A. " The chronic co-morbidities include multiple sclerosis.   * I certify that at the point of admission it is my clinical judgment that the patient will require inpatient hospital care spanning beyond 2 midnights from the point of admission due to high intensity of service, high risk for further deterioration and high frequency of surveillance required.Barbette Merino MD [facility] Pager [phone]  If 7PM-7AM, please contact night-coverage [email or web address] Password [email or web address]  03/08/2018, 10:10 PM

## 2018-03-09 LAB — COMPREHENSIVE METABOLIC PANEL
ALT: 41 U/L (ref 0–44)
AST: 100 U/L — ABNORMAL HIGH (ref 15–41)
Albumin: 3.3 g/dL — ABNORMAL LOW (ref 3.5–5.0)
Alkaline Phosphatase: 48 U/L (ref 38–126)
Anion gap: 11 (ref 5–15)
BUN: 16 mg/dL (ref 8–23)
CO2: 22 mmol/L (ref 22–32)
Calcium: 8.1 mg/dL — ABNORMAL LOW (ref 8.9–10.3)
Chloride: 92 mmol/L — ABNORMAL LOW (ref 98–111)
Creatinine, Ser: 0.87 mg/dL (ref 0.61–1.24)
GFR calc Af Amer: >60 mL/min (ref >60)
GFR calc non Af Amer: >60 mL/min (ref >60)
Glucose, Bld: 219 mg/dL — ABNORMAL HIGH (ref 70–99)
Potassium: 3.7 mmol/L (ref 3.5–5.1)
Sodium: 125 mmol/L — ABNORMAL LOW (ref 135–145)
Total Bilirubin: 0.6 mg/dL (ref 0.3–1.2)
Total Protein: 6.1 g/dL — ABNORMAL LOW (ref 6.5–8.1)

## 2018-03-09 LAB — CBC
HEMATOCRIT: 41.6 % (ref 39.0–52.0)
Hemoglobin: 14.7 g/dL (ref 13.0–17.0)
MCH: 33.7 pg (ref 26.0–34.0)
MCHC: 35.3 g/dL (ref 30.0–36.0)
MCV: 95.4 fL (ref 80.0–100.0)
Platelets: 134 10*3/uL — ABNORMAL LOW (ref 150–400)
RBC: 4.36 MIL/uL (ref 4.22–5.81)
RDW: 11.4 % — ABNORMAL LOW (ref 11.5–15.5)
WBC: 4.2 10*3/uL (ref 4.0–10.5)
nRBC: 0 % (ref 0.0–0.2)

## 2018-03-09 LAB — OSMOLALITY, URINE: Osmolality, Ur: 497 mOsm/kg (ref 300–900)

## 2018-03-09 LAB — OSMOLALITY: Osmolality: 274 mOsm/kg — ABNORMAL LOW (ref 275–295)

## 2018-03-09 LAB — CREATININE, URINE, RANDOM: Creatinine, Urine: 89.96 mg/dL

## 2018-03-09 LAB — SODIUM, URINE, RANDOM: Sodium, Ur: <10 mmol/L

## 2018-03-09 LAB — HIV ANTIBODY (ROUTINE TESTING W REFLEX): HIV Screen 4th Generation wRfx: NONREACTIVE

## 2018-03-09 MED ORDER — GUAIFENESIN-CODEINE 100-10 MG/5ML PO SOLN
5.0000 mL | Freq: Four times a day (QID) | ORAL | Status: DC | PRN
  Administered 2018-03-09: 5 mL via ORAL
  Filled 2018-03-09: qty 5

## 2018-03-09 MED ORDER — HYDROCOD POLST-CPM POLST ER 10-8 MG/5ML PO SUER
5.0000 mL | Freq: Once | ORAL | Status: AC
  Administered 2018-03-09: 5 mL via ORAL
  Filled 2018-03-09: qty 5

## 2018-03-09 NOTE — Evaluation (Addendum)
Occupational Therapy Evaluation Patient Details Name: Vincent Evans MRN: [id number] DOB: 1950-05-02 Today's Date: 03/09/2018    History of Present Illness 68 year old man admitted with generalized weakness, cough, diarrhea and fall.  Dxd with MS exacerbation, +flu.     Clinical Impression   Pt was admitted for the above. At baseline, he is mod I most of the time.  He reports he has been using w/c more for outside of home, mostly for safety over the past month or 2.  He uses a cane in the home to get around. Pt needs mod A for bed mobility and min A for ambulating/ADLs. Will follow in acute setting with min guard level goals. Pt is not interested in post acute rehab services    Follow Up Recommendations  Supervision/Assistance - 24 hour    Equipment Recommendations  None recommended by OT    Recommendations for Other Services       Precautions / Restrictions Precautions Precautions: Fall Restrictions Weight Bearing Restrictions: No      Mobility Bed Mobility Overal bed mobility: Needs Assistance Bed Mobility: Supine to Sit;Sit to Supine     Supine to sit: Mod assist Sit to supine: Mod assist   General bed mobility comments: assist for legs and trunk to EOB and bil LEs for back to bed  Transfers Overall transfer level: Needs assistance Equipment used: Rolling walker (2 wheeled) Transfers: Sit to/from Stand Sit to Stand: Min assist         General transfer comment: assist to rise and steady    Balance                                           ADL either performed or assessed with clinical judgement   ADL Overall ADL's : Needs assistance/impaired Eating/Feeding: Independent   Grooming: Set up   Upper Body Bathing: Set up   Lower Body Bathing: Minimal assistance   Upper Body Dressing : Set up   Lower Body Dressing: Minimal assistance(with slip on shoes, no socks)   Toilet Transfer: Minimal assistance;Ambulation;Comfort height  toilet;RW;Grab bars   Toileting- Clothing Manipulation and Hygiene: Min guard;Sit to/from stand         General ADL Comments: pt's walking improved over time.  Initially +2 safety, having difficulty clearing R toes.  LLE weaken     Vision         Perception     Praxis      Pertinent Vitals/Pain Pain Assessment: No/denies pain     Hand Dominance     Extremity/Trunk Assessment Upper Extremity Assessment Upper Extremity Assessment: Overall WFL for tasks assessed           Communication Communication Communication: No difficulties   Cognition Arousal/Alertness: Awake/alert Behavior During Therapy: WFL for tasks assessed/performed Overall Cognitive Status: Within Functional Limits for tasks assessed                                     General Comments       Exercises     Shoulder Instructions      Home Living Family/patient expects to be discharged to:: Private residence Living Arrangements: Spouse/significant other Available Help at Discharge: Family Type of Home: House Home Access: Stairs to enter [employer] of Steps: 2 Entrance Stairs-Rails: Right  Bathroom Shower/Tub: Architectural technologist)   Bathroom Toilet: Handicapped height     Home Equipment: Wheelchair - manual;Cane - single point;Wheelchair - power;Bedside commode;Shower seat - built in;Walker - 2 wheels(zinger)          Prior Functioning/Environment Level of Independence: Independent with assistive device(s)        Comments: drove; transferring difficulty. Using w/c more for the past month or two        OT Problem List: Decreased strength;Impaired balance (sitting and/or standing);Decreased activity tolerance      OT Treatment/Interventions: Self-care/ADL training;DME and/or AE instruction;Balance training;Patient/family education;Therapeutic activities    OT Goals(Current goals can be found in the care plan section) Acute Rehab OT  Goals Patient Stated Goal: home tomorrow OT Goal Formulation: With patient Time For Goal Achievement: 03/23/18 Potential to Achieve Goals: Good ADL Goals Pt Will Perform Lower Body Bathing: with min guard assist;sit to/from stand Pt Will Perform Lower Body Dressing: with min guard assist;sit to/from stand Pt Will Transfer to Toilet: with min guard assist;bedside commode;ambulating Pt Will Perform Toileting - Clothing Manipulation and hygiene: with min guard assist;sit to/from stand  OT Frequency: Min 2X/week   Barriers to D/C:            Co-evaluation              AM-PAC OT "6 Clicks" Daily Activity     Outcome Measure Help from another person eating meals?: None Help from another person taking care of personal grooming?: A Little Help from another person toileting, which includes using toliet, bedpan, or urinal?: A Little Help from another person bathing (including washing, rinsing, drying)?: A Little Help from another person to put on and taking off regular upper body clothing?: A Little Help from another person to put on and taking off regular lower body clothing?: A Little 6 Click Score: 19   End of Session    Activity Tolerance: Patient tolerated treatment well Patient left: in bed;with call bell/phone within reach;with bed alarm set  OT Visit Diagnosis: Unsteadiness on feet (R26.81);Muscle weakness (generalized) (M62.81)                Time: 8768-1157 OT Time Calculation (min): 23 min Charges:  OT General Charges $OT Visit: 1 Visit OT Evaluation $OT Eval Low Complexity: [place], OTR/L Acute Rehabilitation Services [phone] [facility] pager [phone] office 03/09/2018  [place] 03/09/2018, 4:02 PM

## 2018-03-09 NOTE — Evaluation (Signed)
Physical Therapy Evaluation Patient Details Name: Vincent Evans MRN: [id number] DOB: 1950/12/20 Today's Date: 03/09/2018   History of Present Illness  68 yo male admitted with generalized weakness, cough, diarrhea and fall.  Dxd with MS exacerbation, +flu.  Hx of MS, L foot drop  Clinical Impression  On eval, pt required Mod assist for mobility. He walked ~12 feet x 2 (to and from bathroom) with use of a RW. He tolerated activity well. He is a fall risk when mobilizing. Pt seems to have fairly good awareness of his condition. He is eager to d/c home. He stated he would consider home health.     Follow Up Recommendations Home health PT;Supervision/Assistance - 24 hour    Equipment Recommendations  None recommended by PT    Recommendations for Other Services       Precautions / Restrictions Precautions Precautions: Fall Restrictions Weight Bearing Restrictions: No      Mobility  Bed Mobility Overal bed mobility: Needs Assistance Bed Mobility: Supine to Sit;Sit to Supine     Supine to sit: Mod assist;HOB elevated Sit to supine: Mod assist;HOB elevated   General bed mobility comments: assist for legs and trunk to EOB and bil LEs for back to bed. Increased time.   Transfers Overall transfer level: Needs assistance Equipment used: Rolling walker (2 wheeled) Transfers: Sit to/from Stand Sit to Stand: Min assist +2 safety;From elevated surface         General transfer comment: assist to rise, steady, control descent. VCs safety, hand placement.   Ambulation/Gait Min Assist; +2 safety Gait Distance (Feet): 12 Feet(x2) Assistive device: Rolling walker (2 wheeled) Gait Pattern/deviations: Step-to pattern     General Gait Details: Pt walked to and from bathroom using RW. Slow gait speed. Stability improved with distance. Fall risk.  Stairs            Wheelchair Mobility    Modified Rankin (Stroke Patients Only)       Balance Overall balance assessment: Needs  assistance;History of Falls         Standing balance support: Bilateral upper extremity supported Standing balance-Leahy Scale: Poor                               Pertinent Vitals/Pain Pain Assessment: No/denies pain    Home Living Family/patient expects to be discharged to:: Private residence Living Arrangements: Spouse/significant other Available Help at Discharge: Family Type of Home: House Home Access: Stairs to enter Entrance Stairs-Rails: Right Entrance Stairs-Number of Steps: 2   Home Equipment: Wheelchair - manual;Cane - single point;Wheelchair - power;Bedside commode;Shower seat - built in;Walker - 2 wheels("Zinger" power chair)      Prior Function Level of Independence: Needs assistance   Gait / Transfers Assistance Needed: ambulatory with a cane vs wheelchair use  ADL's / Homemaking Assistance Needed: Wife assisted PRN with bathing, dressing  Comments: Pt stated he was driving up until ~1 month ago.      Hand Dominance        Extremity/Trunk Assessment   Upper Extremity Assessment Upper Extremity Assessment: Defer to OT evaluation    Lower Extremity Assessment Lower Extremity Assessment: Generalized weakness;LLE deficits/detail(Pt able to weightbear and advance LEs forward. R LE moves a little better than L.) LLE Deficits / Details: foot drop       Communication   Communication: No difficulties  Cognition Arousal/Alertness: Awake/alert Behavior During Therapy: WFL for tasks assessed/performed Overall Cognitive Status: Within Functional  Limits for tasks assessed                                        General Comments      Exercises     Assessment/Plan    PT Assessment Patient needs continued PT services  PT Problem List Decreased strength;Decreased balance;Decreased mobility;Decreased activity tolerance;Decreased coordination;Decreased knowledge of use of DME       PT Treatment Interventions DME  instruction;Gait training;Functional mobility training;Therapeutic activities;Balance training;Patient/family education;Therapeutic exercise    PT Goals (Current goals can be found in the Care Plan section)  Acute Rehab PT Goals Patient Stated Goal: home! PT Goal Formulation: With patient Time For Goal Achievement: 03/23/18 Potential to Achieve Goals: Good    Frequency Min 3X/week   Barriers to discharge        Co-evaluation               AM-PAC PT "6 Clicks" Mobility  Outcome Measure Help needed turning from your back to your side while in a flat bed without using bedrails?: A Little Help needed moving from lying on your back to sitting on the side of a flat bed without using bedrails?: A Lot Help needed moving to and from a bed to a chair (including a wheelchair)?: A Little Help needed standing up from a chair using your arms (e.g., wheelchair or bedside chair)?: A Little Help needed to walk in hospital room?: A Little Help needed climbing 3-5 steps with a railing? : A Lot 6 Click Score: 16    End of Session Equipment Utilized During Treatment: Gait belt Activity Tolerance: Patient tolerated treatment well Patient left: in bed;with call bell/phone within reach;with bed alarm set   PT Visit Diagnosis: Muscle weakness (generalized) (M62.81);Unsteadiness on feet (R26.81);Difficulty in walking, not elsewhere classified (R26.2);Other symptoms and signs involving the nervous system (R29.898)    Time: 3354-5625 PT Time Calculation (min) (ACUTE ONLY): 21 min   Charges:   PT Evaluation $PT Eval Moderate Complexity: [place], PT Acute Rehabilitation Services Pager: [phone] Office: [phone]

## 2018-03-09 NOTE — Progress Notes (Signed)
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   Assessment & Plan:   Principal Problem:   MS (multiple sclerosis) ([place]) Active Problems:   Weakness   Influenza due to identified novel influenza A virus with other respiratory manifestations   #) Influenza A infection: At this time it is unclear patient symptoms are related to primarily influenza A her progression of multiple sclerosis though suspect is most likely combination of factors.  Currently he is fairly weak but improving. -Continue oseltamivir started 03/08/2018  #) Primary progressive multiple sclerosis complicated by weakness: MRI scans do show progressive demyelination disease but it could not be determined if there were enhancing lesions this patient apparently refused contrast.  Neurology feels that there is high enough risk to merit treatment for 5 days with IV steroids. -Continue IV methylprednisolone started 03/08/2018 for total of 5 days -Physical therapy consult -Continue baclofen as needed  #) Hyponatremia: Currently patient's admission sodium was 123.  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 - We will recheck an goal increase of 68 mEq/day -Gentle IV fluids, will fluid restrict if sodium does not improve with IV fluids -Urine sodium, creatinine, osmolality, serum osmolality -Hold HCTZ  #) Hypertension/hyperlipidemia: -Continue aspirin 81 mg -Continue torsemide 10 mg - Continue lisinopril 20 mg daily -Hold  HCTZ 25 mg daily  #) Pain/psych: - Continue PRN oxycodone  Fluids: Tolerating p.o. Electrolytes: Monitor and supplement Nutrition: Regular diet  Prophylaxis: Enoxaparin   Disposition: Pending 5 days of steroids  Full code    Consultants:   Neurology  Procedures:   None  Antimicrobials:   Oseltamivir started 03/08/2018   Subjective: This morning the patient reports he is feeling well.  He denies any nausea, vomiting, diarrhea.  He is continued have a cough.  He is requesting discharge home.  Objective: Vitals:   03/08/18 1830 03/08/18 1941 03/08/18 1943 03/09/18 0518  BP: 128/76  115/77 108/78  Pulse: (!) 50  (!) 57 (!) 47  Resp: 17  16 16   Temp:   98.9 F (37.2 C) 97.9 F (36.6 C)  TempSrc:   Oral Oral  SpO2: 94%  93% 96%  Weight:  95.3 kg    Height:  5\' 9"  (1.753 m)      Intake/Output Summary (Last 24 hours) at 03/09/2018 1117 Last data filed at 03/09/2018 0415 Gross per 24 hour  Intake 355 ml  Output -  Net 355 ml   Filed Weights   03/08/18 1237 03/08/18 1941  Weight: 95.3 kg 95.3 kg    Examination:  General exam: Appears calm and comfortable  Respiratory system: Clear to auscultation. Respiratory effort normal. Cardiovascular system: Regular rate and rhythm, no murmurs Gastrointestinal system: Soft, nondistended, no rebound or guarding, plus bowel sounds Central nervous system: Alert and oriented. Lateral lower extremity weakness 3 to 4+ out of 5, mildly hyperreflexic in bilateral lower extremities Extremities: Trace lower extremity edema Skin: No rashes visible skin Psychiatry: Judgement and insight appear normal. Mood & affect appropriate.     Data Reviewed: I have personally reviewed following labs  and imaging studies  CBC: Recent Labs  Lab 03/08/18 1354 03/09/18 0325  WBC 6.8 4.2  NEUTROABS 5.4  --   HGB 15.2 14.7  HCT 43.3 41.6  MCV 99.8 95.4  PLT 152 361*   Basic Metabolic Panel: Recent Labs  Lab 03/08/18 1354  03/09/18 0325  NA 123* 125*  K 3.7 3.7  CL 87* 92*  CO2 26 22  GLUCOSE 108* 219*  BUN 12 16  CREATININE 0.96 0.87  CALCIUM 8.2* 8.1*  MG 1.9  --    GFR: Estimated Creatinine Clearance: 93.8 mL/min (by C-G formula based on SCr of 0.87 mg/dL). Liver Function Tests: Recent Labs  Lab 03/08/18 1354 03/09/18 0325  AST 132* 100*  ALT 48* 41  ALKPHOS 56 48  BILITOT 1.0 0.6  PROT 6.9 6.1*  ALBUMIN 3.8 3.3*   No results for input(s): LIPASE, AMYLASE in the last 168 hours. No results for input(s): AMMONIA in the last 168 hours. Coagulation Profile: No results for input(s): INR, PROTIME in the last 168 hours. Cardiac Enzymes: No results for input(s): CKTOTAL, CKMB, CKMBINDEX, TROPONINI in the last 168 hours. BNP (last 3 results) No results for input(s): PROBNP in the last 8760 hours. HbA1C: No results for input(s): HGBA1C in the last 72 hours. CBG: No results for input(s): GLUCAP in the last 168 hours. Lipid Profile: No results for input(s): CHOL, HDL, LDLCALC, TRIG, CHOLHDL, LDLDIRECT in the last 72 hours. Thyroid Function Tests: No results for input(s): TSH, T4TOTAL, FREET4, T3FREE, THYROIDAB in the last 72 hours. Anemia Panel: No results for input(s): VITAMINB12, FOLATE, FERRITIN, TIBC, IRON, RETICCTPCT in the last 72 hours. Sepsis Labs: No results for input(s): PROCALCITON, LATICACIDVEN in the last 168 hours.  No results found for this or any previous visit (from the past 240 hour(s)).       Radiology Studies: Dg Chest 2 View  Result Date: 03/08/2018 CLINICAL DATA:  Cough, congestion, nausea, vomiting, diarrhea and weakness for 3 days. EXAM: CHEST - 2 VIEW COMPARISON:  PA and lateral chest 09/01/2016 and 03/20/2009. CT chest 12/28/2005. FINDINGS: The lungs are hyperexpanded and there is peribronchial thickening. No consolidative process, pneumothorax or effusion. Aortic atherosclerosis is noted. Heart size is normal. No acute bony abnormality. Remote healed left  clavicle fracture is seen. IMPRESSION: No acute disease. Findings compatible with COPD. Atherosclerosis. Electronically Signed   By: Inge Rise M.D.   On: 03/08/2018 13:47   Mr Brain Wo Contrast  Result Date: 03/08/2018 CLINICAL DATA:  68 year old male with multiple sclerosis, positive for flu. Increased weakness and fall. Studies without and with contrast were planned, but the examination had to be discontinued prior to completion due to patient request, and no contrast was administered. EXAM: MRI HEAD WITHOUT CONTRAST MRI CERVICAL SPINE WITHOUT CONTRAST TECHNIQUE: Multiplanar, multiecho pulse sequences of the brain and surrounding structures, and cervical spine, to include the craniocervical junction and cervicothoracic junction, were obtained without intravenous contrast. COMPARISON:  Brain MRI 03/25/2015, 10/07/2012. No prior cervical spine MRI FINDINGS: MRI HEAD FINDINGS Brain: No restricted diffusion to suggest acute infarction. No midline shift, mass effect, evidence of mass lesion, ventriculomegaly, extra-axial collection or acute intracranial hemorrhage. Cervicomedullary junction and pituitary are within normal limits. Chronic severe volume loss of the corpus callosum and anterior frontal lobes. Underlying generalized cerebral volume loss. Chronic cortical and white matter T2 and FLAIR hyperintensity along the right cingulate and superior frontal gyri. Chronic confluent periventricular white matter T2 and FLAIR hyperintensity with occasional nodularity. The signal changes appear stable  since 2017. No new or restricted white matter lesion is identified. Superimposed mild T2 heterogeneity in the deep gray matter nuclei, greater on the left, is stable. But confluent T2 hyperintensity in the pons has progressed (series 8, images 9 and 10). Subtle FLAIR hyperintensity in the cerebellar peduncles best seen on sagittal FLAIR has not significantly changed. The cerebellum is otherwise spared. No chronic  cerebral blood products. No other cortical encephalomalacia identified. Vascular: Major intracranial vascular flow voids are stable since 2017. The right vertebral artery is dominant. Skull and upper cervical spine: Normal bone marrow signal. Sinuses/Orbits: Orbits soft tissues appear stable and grossly normal. Mild paranasal sinus mucosal thickening has not significantly changed. Other: Mastoids are clear. Visible internal auditory structures appear normal. Scalp and face soft tissues appear negative. MRI CERVICAL SPINE FINDINGS Alignment: Mild straightening of cervical lordosis. Vertebrae: No marrow edema or evidence of acute osseous abnormality. Visualized bone marrow signal is within normal limits. Cord: Multifocal abnormal cervical spinal cord signal compatible with demyelinating disease. Dominant lesions are identified on series 13, image 7 including the right ventral cord at C2, right hemi cord at C3-C4, and also in the visible upper thoracic spine left hemi cord at T1. No cord expansion, but rather somewhat decreased cord volume. No contrast administered. Posterior Fossa, vertebral arteries, paraspinal tissues: Cervicomedullary junction is within normal limits. Preserved major vascular flow voids in the neck. Negative neck soft tissues. Disc levels: Superimposed significant degenerative changes including C3-C4: Circumferential disc bulge and endplate spurring affecting the neural foramina with moderate bilateral C4 foraminal stenosis. C4-C5: Right eccentric disc bulge and endplate spurring with moderate right C5 foraminal stenosis. C5-C6: Disc space loss with circumferential disc bulge and endplate spurring eccentric to the left. Borderline to mild spinal stenosis. Severe left and moderate right C6 foraminal stenosis. C6-C7: Left eccentric disc bulge and endplate spurring. Moderate to severe left C7 foraminal stenosis. IMPRESSION: 1. The examination was discontinued prior to completion by patient request,  and no contrast was administered. 2. Progressed signal abnormality in the pons since 2017, but no active demyelination in the brain identified by diffusion. 3. Otherwise stable intracranial demyelinating disease, including pronounced involvement of the anterior right frontal lobe 4. Multifocal cervical and upper thoracic spinal cord demyelinating lesions, age indeterminate but perhaps chronic on the basis of associated spinal cord volume loss. 5. Superimposed cervical spine degeneration primarily resulting in neural foraminal stenosis. Electronically Signed   By: Genevie Ann M.D.   On: 03/08/2018 21:31   Mr Cervical Spine Wo Contrast  Result Date: 03/08/2018 CLINICAL DATA:  68 year old male with multiple sclerosis, positive for flu. Increased weakness and fall. Studies without and with contrast were planned, but the examination had to be discontinued prior to completion due to patient request, and no contrast was administered. EXAM: MRI HEAD WITHOUT CONTRAST MRI CERVICAL SPINE WITHOUT CONTRAST TECHNIQUE: Multiplanar, multiecho pulse sequences of the brain and surrounding structures, and cervical spine, to include the craniocervical junction and cervicothoracic junction, were obtained without intravenous contrast. COMPARISON:  Brain MRI 03/25/2015, 10/07/2012. No prior cervical spine MRI FINDINGS: MRI HEAD FINDINGS Brain: No restricted diffusion to suggest acute infarction. No midline shift, mass effect, evidence of mass lesion, ventriculomegaly, extra-axial collection or acute intracranial hemorrhage. Cervicomedullary junction and pituitary are within normal limits. Chronic severe volume loss of the corpus callosum and anterior frontal lobes. Underlying generalized cerebral volume loss. Chronic cortical and white matter T2 and FLAIR hyperintensity along the right cingulate and superior frontal gyri. Chronic confluent periventricular white matter  T2 and FLAIR hyperintensity with occasional nodularity. The signal  changes appear stable since 2017. No new or restricted white matter lesion is identified. Superimposed mild T2 heterogeneity in the deep gray matter nuclei, greater on the left, is stable. But confluent T2 hyperintensity in the pons has progressed (series 8, images 9 and 10). Subtle FLAIR hyperintensity in the cerebellar peduncles best seen on sagittal FLAIR has not significantly changed. The cerebellum is otherwise spared. No chronic cerebral blood products. No other cortical encephalomalacia identified. Vascular: Major intracranial vascular flow voids are stable since 2017. The right vertebral artery is dominant. Skull and upper cervical spine: Normal bone marrow signal. Sinuses/Orbits: Orbits soft tissues appear stable and grossly normal. Mild paranasal sinus mucosal thickening has not significantly changed. Other: Mastoids are clear. Visible internal auditory structures appear normal. Scalp and face soft tissues appear negative. MRI CERVICAL SPINE FINDINGS Alignment: Mild straightening of cervical lordosis. Vertebrae: No marrow edema or evidence of acute osseous abnormality. Visualized bone marrow signal is within normal limits. Cord: Multifocal abnormal cervical spinal cord signal compatible with demyelinating disease. Dominant lesions are identified on series 13, image 7 including the right ventral cord at C2, right hemi cord at C3-C4, and also in the visible upper thoracic spine left hemi cord at T1. No cord expansion, but rather somewhat decreased cord volume. No contrast administered. Posterior Fossa, vertebral arteries, paraspinal tissues: Cervicomedullary junction is within normal limits. Preserved major vascular flow voids in the neck. Negative neck soft tissues. Disc levels: Superimposed significant degenerative changes including C3-C4: Circumferential disc bulge and endplate spurring affecting the neural foramina with moderate bilateral C4 foraminal stenosis. C4-C5: Right eccentric disc bulge and  endplate spurring with moderate right C5 foraminal stenosis. C5-C6: Disc space loss with circumferential disc bulge and endplate spurring eccentric to the left. Borderline to mild spinal stenosis. Severe left and moderate right C6 foraminal stenosis. C6-C7: Left eccentric disc bulge and endplate spurring. Moderate to severe left C7 foraminal stenosis. IMPRESSION: 1. The examination was discontinued prior to completion by patient request, and no contrast was administered. 2. Progressed signal abnormality in the pons since 2017, but no active demyelination in the brain identified by diffusion. 3. Otherwise stable intracranial demyelinating disease, including pronounced involvement of the anterior right frontal lobe 4. Multifocal cervical and upper thoracic spinal cord demyelinating lesions, age indeterminate but perhaps chronic on the basis of associated spinal cord volume loss. 5. Superimposed cervical spine degeneration primarily resulting in neural foraminal stenosis. Electronically Signed   By: Genevie Ann M.D.   On: 03/08/2018 21:31        Scheduled Meds: . 4-Aminopyridine  5 mg Oral TID  . aspirin  81 mg Oral Daily  . atorvastatin  10 mg Oral Daily  . baclofen  10 mg Oral BID  . baclofen  20 mg Oral QHS  . cholecalciferol  5,000 Units Oral QHS  . cilostazol  100 mg Oral BID  . enoxaparin (LOVENOX) injection  40 mg Subcutaneous Q24H  . lisinopril  20 mg Oral Daily   And  . hydrochlorothiazide  25 mg Oral Daily  . methylPREDNISolone (SOLU-MEDROL) injection  60 mg Intravenous Q12H  . nicotine  21 mg Transdermal Daily  . oseltamivir  75 mg Oral BID  . vitamin B-12  5,000 mcg Oral Daily   Continuous Infusions:   LOS: 1 day    Time spent: [place], MD [facility]  If 7PM-7AM, please contact night-coverage [email or web address] Password [facility] 03/09/2018, 11:17  AM

## 2018-03-09 NOTE — Progress Notes (Signed)
MRI scans showed progressive demyelination relative to prior imaging studies, but unable to determine if there are enhancing lesions as the patient refused contrast administration.   A/R: Overall impression is that the risk of one or more of the progressive changes seen on MRI being due to acute or subacute demyelination is high enough to warrant continuation of IV Solumedrol. Plan is to continue Solumedrol for a total of 5 days and then reassess for neurological improvement.   Electronically signed: Dr. Kerney Elbe

## 2018-03-09 NOTE — Progress Notes (Signed)
Neurology Progress Note  Pt was seen and examined with nurse and wife at bedside No acute events overnight  Past Medical History:  Diagnosis Date  . Anxiety 12/22/2013  . Hearing loss   . Movement disorder   . Multiple sclerosis ([place])   . Neuromuscular disorder ([place])    MS  . 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 Vision abnormalities     Family History  Problem Relation Age of Onset  . Dementia Mother   . Diabetes type II Father   . Hypertension Father   . Cancer Maternal Grandmother        pancreatic ca   Medications  Current Facility-Administered Medications:  Marland Kitchen  4-Aminopyridine POWD 5 mg, 5 mg, Oral, TID, Jonelle Sidle, Mohammad L, MD .  acetaminophen (TYLENOL) tablet 650 mg, 650 mg, Oral, Q6H PRN **OR** acetaminophen (TYLENOL) suppository 650 mg, 650 mg, Rectal, Q6H PRN, Jonelle Sidle, Mohammad L, MD .  aspirin chewable tablet 81 mg, 81 mg, Oral, Daily, Garba, Mohammad L, MD, 81 mg at 03/09/18 1113 .  atorvastatin (LIPITOR) tablet 10 mg, 10 mg, Oral, Daily, Garba, Mohammad L, MD, 10 mg at 03/09/18 1127 .  baclofen (LIORESAL) tablet 10 mg, 10 mg, Oral, BID, Jonelle Sidle, Mohammad L, MD, 10 mg at 03/09/18 1113 .  baclofen (LIORESAL) tablet 20 mg, 20 mg, Oral, QHS, Garba, Mohammad L, MD, 20 mg at 03/08/18 2104 .  cholecalciferol (VITAMIN D3) tablet 5,000 Units, 5,000 Units, Oral, QHS, Elwyn Reach, MD, 5,000 Units at 03/08/18 2104 .  cilostazol (PLETAL) tablet 100 mg, 100 mg, Oral, BID, Jonelle Sidle, Mohammad L, MD, 100 mg at 03/09/18 1112 .  clotrimazole (LOTRIMIN) 1 % cream 1 application, 1 application, Topical, BID PRN, Jonelle Sidle, Mohammad L, MD .  enoxaparin (LOVENOX) injection 40 mg, 40 mg, Subcutaneous, Q24H, Garba, Mohammad L, MD .  gadobutrol (GADAVIST) 1 MMOL/ML injection 9 mL, 9 mL, Intravenous, Once PRN, Garba, Mohammad L, MD .  guaiFENesin-codeine 100-10 MG/5ML solution 5 mL, 5 mL, Oral, Q6H PRN, Purohit, Shrey C, MD, 5 mL at 03/09/18 1113 .  lisinopril (PRINIVIL,ZESTRIL) tablet 20 mg, 20  mg, Oral, Daily, 20 mg at 03/09/18 1135 **AND** [DISCONTINUED] hydrochlorothiazide (HYDRODIURIL) tablet 25 mg, 25 mg, Oral, Daily, Jonelle Sidle, Mohammad L, MD, 25 mg at 03/09/18 1135 .  methylPREDNISolone sodium succinate (SOLU-MEDROL) 125 mg/2 mL injection 60 mg, 60 mg, Intravenous, Q12H, Garba, Mohammad L, MD, 60 mg at 03/09/18 0455 .  nicotine (NICODERM CQ - dosed in mg/24 hours) patch 21 mg, 21 mg, Transdermal, Daily, Garba, Mohammad L, MD .  ondansetron (ZOFRAN) tablet 4 mg, 4 mg, Oral, Q6H PRN **OR** ondansetron (ZOFRAN) injection 4 mg, 4 mg, Intravenous, Q6H PRN, Jonelle Sidle, Mohammad L, MD .  oseltamivir (TAMIFLU) capsule 75 mg, 75 mg, Oral, BID, Jonelle Sidle, Mohammad L, MD, 75 mg at 03/09/18 1112 .  oxyCODONE (Oxy IR/ROXICODONE) immediate release tablet 5 mg, 5 mg, Oral, Q6H PRN, Jonelle Sidle, Mohammad L, MD .  vitamin B-12 (CYANOCOBALAMIN) tablet 5,000 mcg, 5,000 mcg, Oral, Daily, Elwyn Reach, MD, 5,000 mcg at 03/09/18 1112   Exam: Current vital signs: BP 117/65 (BP Location: Left Arm)   Pulse 61   Temp 97.9 F (36.6 C) (Oral)   Resp 16   Ht 5\' 9"  (1.753 m)   Wt 95.3 kg   SpO2 94%   BMI 31.01 kg/m  Vital signs in last 24 hours: Temp:  [97.9 F (36.6 C)-98.9 F (37.2 C)] 97.9 F (36.6 C) (01/15 0518) Pulse Rate:  [47-66] 61 (01/15 1118) Resp:  [  16-19] 16 (01/15 0518) BP: (103-131)/(55-80) 117/65 (01/15 1134) SpO2:  [91 %-97 %] 94 % (01/15 1118) Weight:  [95.3 kg] 95.3 kg (01/14 1941)  Physical Exam  Constitutional: Appears well-developed and well-nourished.  Psych: Affect appropriate to situation Eyes: No scleral injection HENT: No OP obstrucion Head: Normocephalic.  Cardiovascular: Normal rate and regular rhythm.  Respiratory: Effort normal, non-labored breathing GI: Soft.  No distension. There is no tenderness.  Skin: WDI  Neuro: Mental Status: Patient is awake, alert, oriented to person, place, month, year, and situation. Patient is able to give a clear and coherent  history. No signs of aphasia or neglect Cranial Nerves: II: Visual Fields are full. Pupils are equal, round, and reactive to light.   III,IV, VI: EOMI without ptosis or diploplia.  V: Facial sensation is symmetric to temperature VII: Facial movement is symmetric.  VIII: hearing is intact to voice X: Uvula elevates symmetrically XI: Shoulder shrug is symmetric. XII: tongue is midline without atrophy or fasciculations.  Motor: Able to stand with 2 person assist Sensory: Sensation is symmetric to light touch and temperature in the arms and legs. Deep Tendon Reflexes: 2+ and symmetric in the biceps and patellae.   Labs  CBC    Component Value Date/Time   WBC 4.2 03/09/2018 0325   RBC 4.36 03/09/2018 0325   HGB 14.7 03/09/2018 0325   HGB 17.0 07/27/2017 1255   HGB 17.8 (H) 04/20/2014 1054   HCT 41.6 03/09/2018 0325   HCT 48.4 07/27/2017 1255   HCT 54.3 (H) 04/20/2014 1054   PLT 134 (L) 03/09/2018 0325   PLT 248 07/27/2017 1255   MCV 95.4 03/09/2018 0325   MCV 100 (H) 07/27/2017 1255   MCV 101.3 (H) 04/20/2014 1054   MCH 33.7 03/09/2018 0325   MCHC 35.3 03/09/2018 0325   RDW 11.4 (L) 03/09/2018 0325   RDW 13.8 07/27/2017 1255   RDW 12.9 04/20/2014 1054   LYMPHSABS 0.5 (L) 03/08/2018 1354   LYMPHSABS 1.5 07/27/2017 1255   LYMPHSABS 1.0 04/20/2014 1054   MONOABS 0.7 03/08/2018 1354   MONOABS 0.6 04/20/2014 1054   EOSABS 0.2 03/08/2018 1354   EOSABS 0.1 07/27/2017 1255   BASOSABS 0.0 03/08/2018 1354   BASOSABS 0.0 07/27/2017 1255   BASOSABS 0.0 04/20/2014 1054    CMP     Component Value Date/Time   NA 125 (L) 03/09/2018 0325   K 3.7 03/09/2018 0325   CL 92 (L) 03/09/2018 0325   CO2 22 03/09/2018 0325   GLUCOSE 219 (H) 03/09/2018 0325   BUN 16 03/09/2018 0325   CREATININE 0.87 03/09/2018 0325   CALCIUM 8.1 (L) 03/09/2018 0325   PROT 6.1 (L) 03/09/2018 0325   PROT 7.9 12/09/2015 1519   ALBUMIN 3.3 (L) 03/09/2018 0325   ALBUMIN 4.6 12/09/2015 1519   AST 100 (H)  03/09/2018 0325   ALT 41 03/09/2018 0325   ALKPHOS 48 03/09/2018 0325   BILITOT 0.6 03/09/2018 0325   BILITOT 1.1 12/09/2015 1519   GFRNONAA >60 03/09/2018 0325   GFRAA >60 03/09/2018 0325   Imaging Imaging reviewed  A/P: 68 yo M with hx PPMS on Ocrevus presenting s/p fall likely due to MS exacerbation due to the flu vs new demyelinating lesions  Pt refused MRI with gad. However lesions not seen on prior studies were noted.   Cont solu-medrol for total 5 doses Pt acknowledges improvement in his weakness but would like to go home tomorrow.  Would like to consider alternative ways to receive the  remaining 2 doses of IV steroids. Supportive care for the flu Hyponatremia slowly improving PT eval  Total time spent 33min  Ray Church, MD Attending Neurologist

## 2018-03-10 ENCOUNTER — Other Ambulatory Visit: Payer: Self-pay | Admitting: Neurology

## 2018-03-10 DIAGNOSIS — G35 Multiple sclerosis: Secondary | ICD-10-CM

## 2018-03-10 LAB — BASIC METABOLIC PANEL
Anion gap: 14 (ref 5–15)
BUN: 19 mg/dL (ref 8–23)
CO2: 22 mmol/L (ref 22–32)
Calcium: 8.4 mg/dL — ABNORMAL LOW (ref 8.9–10.3)
Creatinine, Ser: 0.88 mg/dL (ref 0.61–1.24)
GFR calc Af Amer: >60 mL/min (ref >60)
GFR calc non Af Amer: >60 mL/min (ref >60)
Glucose, Bld: 192 mg/dL — ABNORMAL HIGH (ref 70–99)
Potassium: 3.6 mmol/L (ref 3.5–5.1)

## 2018-03-10 LAB — BASIC METABOLIC PANEL WITH GFR
Chloride: 91 mmol/L — ABNORMAL LOW (ref 98–111)
Sodium: 127 mmol/L — ABNORMAL LOW (ref 135–145)

## 2018-03-10 MED ORDER — OSELTAMIVIR PHOSPHATE 75 MG PO CAPS: 75.0000 mg | ORAL_CAPSULE | Freq: Two times a day (BID) | ORAL | 0 refills | Status: AC

## 2018-03-10 NOTE — Telephone Encounter (Signed)
I checked drug registry. Last refilled oxycodone 02/10/18 #120, not receiving from other MD's. Last seen 01/27/18 but has no f/u. Dr. Felecia Shelling wanted him to f/u in 6 months. I will speak with Dr. Felecia Shelling to determine next steps.

## 2018-03-10 NOTE — Discharge Summary (Signed)
Physician Discharge Summary  Grove Defina EYC:[id number] DOB: [birth year] DOA: 03/08/2018  PCP: Lawerance Cruel, MD  Admit date: 03/08/2018 Discharge date: 03/10/2018  Admitted From: Home Disposition:  Home  Recommendations for Outpatient Follow-up:  1. Follow up with PCP in 1-2 weeks 2. Please obtain BMP/CBC in one week   Home Health: yes HHPT Equipment/Devices: walker  Discharge Condition: stalbe CODE STATUS: FULL Diet recommendation: Heart Healthy   Brief/Interim Summary:  #) Influenza a infection: Patient was admitted with weakness and fatigue.  His influenza A came back positive.  He was started on oseltamivir and discharged home on a total of 5 days.  He was seen by physical therapy recommended home health PT.  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  Neurology felt the patient might benefit from steroids.  He was given a total of 3 days of IV steroids and discharged home.  Physical therapy was consulted.  Patient does have outpatient follow-up.  He does have outpatient follow-up  #) Hyponatremia: Patient was noted to be hyponatremic on admission.  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  He was given gentle IV fluids with some improvement in sodium.  His sodium was 127 on discharge and he was told to repeat check in approximately 1 week.  His HCTZ was held.   #) Hypertension/hyperlipidemia: Patient was continued on home aspirin, lisinopril, torsemide.   Discharge Diagnoses:  Principal Problem:   MS (multiple sclerosis) ([place]) Active Problems:   Weakness   Influenza due to identified novel influenza A virus with other respiratory manifestations    Discharge Instructions  Discharge Instructions     Call MD for:  difficulty breathing, headache or visual disturbances   Complete by:  As directed    Call MD for:  hives   Complete by:  As directed    Call MD for:  persistant dizziness or light-headedness   Complete by:  As directed    Call MD for:  persistant nausea and vomiting   Complete by:  As directed    Call MD for:  redness, tenderness, or signs of infection (pain, swelling, redness, odor or green/yellow discharge around incision site)   Complete by:  As directed    Call MD for:  severe uncontrolled pain   Complete by:  As directed    Call MD for:  temperature >100.4   Complete by:  As directed    Diet - low sodium heart healthy   Complete by:  As directed    Discharge instructions   Complete by:  As directed    Please follow-up with your primary care doctor in 1 week.  Please take your flu medication as prescribed.  Please have your sodium checked by her primary care doctor.   Increase activity slowly   Complete by:  As directed      Allergies as of 03/10/2018   No Known Allergies     Medication List    TAKE these medications   4-Aminopyridine Powd Take 5 mg by mouth 3 (three) times daily.   aspirin 81 MG tablet Take 81 mg by mouth daily.   atorvastatin 10 MG tablet Commonly known as:  LIPITOR Take 10 mg by mouth daily.   baclofen 10 MG tablet Commonly known as:  LIORESAL TAKE 1 TABLET BY MOUTH IN THE MORNING AND 1 IN THE EVENING AND 2 AT BEDTIME What changed:    how much to take  how to take this  when to take this  additional instructions   cilostazol 100 MG tablet Commonly known as:  PLETAL Take 100 mg by mouth 2 (two) times daily.   clotrimazole 1 % cream Commonly known as:  LOTRIMIN Apply 1 application topically 2 (two) times daily as needed (fungal infections). For groin/toes   lisinopril-hydrochlorothiazide 20-25 MG tablet Commonly known as:  PRINZIDE,ZESTORETIC Take 1 tablet by mouth daily.   ocrelizumab 600 mg in sodium chloride 0.9 %  500 mL Inject 600 mg into the vein every 6 (six) months.   oseltamivir 75 MG capsule Commonly known as:  TAMIFLU Take 1 capsule (75 mg total) by mouth 2 (two) times daily for 2 days.   oxyCODONE 5 MG immediate release tablet Commonly known as:  Oxy IR/ROXICODONE Take 1 tablet (5 mg total) by mouth every 6 (six) hours as needed for severe pain.   Vitamin B-12 5000 MCG Tbdp Take 5,000 Units by mouth daily.   Vitamin D3 125 MCG (5000 UT) Caps Take 5,000 Units by mouth every evening.      Follow-up Information    Lawerance Cruel, MD.   Specialty:  [facility] Contact information: [place] [address] [phone]          No Known Allergies  Consultations:  Neurology   Procedures/Studies: Dg Chest 2 View  Result Date: 03/08/2018 CLINICAL DATA:  Cough, congestion, nausea, vomiting, diarrhea and weakness for 3 days. EXAM: CHEST - 2 VIEW COMPARISON:  PA and lateral chest 09/01/2016 and 03/20/2009. CT chest 12/28/2005. FINDINGS: The lungs are hyperexpanded and there is peribronchial thickening. No consolidative process, pneumothorax or effusion. Aortic atherosclerosis is noted. Heart size is normal. No acute bony abnormality. Remote healed left clavicle fracture is seen. IMPRESSION: No acute disease. Findings compatible with COPD. Atherosclerosis. Electronically Signed   By: Inge Rise M.D.   On: 03/08/2018 13:47   Mr Brain Wo Contrast  Result Date: 03/08/2018 CLINICAL DATA:  68 year old male with multiple sclerosis, positive for flu. Increased weakness and fall. Studies without and with contrast were planned, but the examination had to be discontinued prior to completion due to patient request, and no contrast was administered. EXAM: MRI HEAD WITHOUT CONTRAST MRI CERVICAL SPINE WITHOUT CONTRAST TECHNIQUE: Multiplanar, multiecho pulse sequences of the brain and surrounding structures, and cervical spine, to include the craniocervical junction and  cervicothoracic junction, were obtained without intravenous contrast. COMPARISON:  Brain MRI 03/25/2015, 10/07/2012. No prior cervical spine MRI FINDINGS: MRI HEAD FINDINGS Brain: No restricted diffusion to suggest acute infarction. No midline shift, mass effect, evidence of mass lesion, ventriculomegaly, extra-axial collection or acute intracranial hemorrhage. Cervicomedullary junction and pituitary are within normal limits. Chronic severe volume loss of the corpus callosum and anterior frontal lobes. Underlying generalized cerebral volume loss. Chronic cortical and white matter T2 and FLAIR hyperintensity along the right cingulate and superior frontal gyri. Chronic confluent periventricular white matter T2 and FLAIR hyperintensity with occasional nodularity. The signal changes appear stable since 2017. No new or restricted white matter lesion is identified. Superimposed mild T2 heterogeneity in the deep gray matter nuclei, greater on the left, is stable. But confluent T2 hyperintensity in the pons has progressed (series 8, images 9 and 10). Subtle FLAIR hyperintensity in the cerebellar peduncles best seen on sagittal FLAIR has not significantly  changed. The cerebellum is otherwise spared. No chronic cerebral blood products. No other cortical encephalomalacia identified. Vascular: Major intracranial vascular flow voids are stable since 2017. The right vertebral artery is dominant. Skull and upper cervical spine: Normal bone marrow signal. Sinuses/Orbits: Orbits soft tissues appear stable and grossly normal. Mild paranasal sinus mucosal thickening has not significantly changed. Other: Mastoids are clear. Visible internal auditory structures appear normal. Scalp and face soft tissues appear negative. MRI CERVICAL SPINE FINDINGS Alignment: Mild straightening of cervical lordosis. Vertebrae: No marrow edema or evidence of acute osseous abnormality. Visualized bone marrow signal is within normal limits. Cord: Multifocal  abnormal cervical spinal cord signal compatible with demyelinating disease. Dominant lesions are identified on series 13, image 7 including the right ventral cord at C2, right hemi cord at C3-C4, and also in the visible upper thoracic spine left hemi cord at T1. No cord expansion, but rather somewhat decreased cord volume. No contrast administered. Posterior Fossa, vertebral arteries, paraspinal tissues: Cervicomedullary junction is within normal limits. Preserved major vascular flow voids in the neck. Negative neck soft tissues. Disc levels: Superimposed significant degenerative changes including C3-C4: Circumferential disc bulge and endplate spurring affecting the neural foramina with moderate bilateral C4 foraminal stenosis. C4-C5: Right eccentric disc bulge and endplate spurring with moderate right C5 foraminal stenosis. C5-C6: Disc space loss with circumferential disc bulge and endplate spurring eccentric to the left. Borderline to mild spinal stenosis. Severe left and moderate right C6 foraminal stenosis. C6-C7: Left eccentric disc bulge and endplate spurring. Moderate to severe left C7 foraminal stenosis. IMPRESSION: 1. The examination was discontinued prior to completion by patient request, and no contrast was administered. 2. Progressed signal abnormality in the pons since 2017, but no active demyelination in the brain identified by diffusion. 3. Otherwise stable intracranial demyelinating disease, including pronounced involvement of the anterior right frontal lobe 4. Multifocal cervical and upper thoracic spinal cord demyelinating lesions, age indeterminate but perhaps chronic on the basis of associated spinal cord volume loss. 5. Superimposed cervical spine degeneration primarily resulting in neural foraminal stenosis. Electronically Signed   By: Genevie Ann M.D.   On: 03/08/2018 21:31   Mr Cervical Spine Wo Contrast  Result Date: 03/08/2018 CLINICAL DATA:  68 year old male with multiple sclerosis,  positive for flu. Increased weakness and fall. Studies without and with contrast were planned, but the examination had to be discontinued prior to completion due to patient request, and no contrast was administered. EXAM: MRI HEAD WITHOUT CONTRAST MRI CERVICAL SPINE WITHOUT CONTRAST TECHNIQUE: Multiplanar, multiecho pulse sequences of the brain and surrounding structures, and cervical spine, to include the craniocervical junction and cervicothoracic junction, were obtained without intravenous contrast. COMPARISON:  Brain MRI 03/25/2015, 10/07/2012. No prior cervical spine MRI FINDINGS: MRI HEAD FINDINGS Brain: No restricted diffusion to suggest acute infarction. No midline shift, mass effect, evidence of mass lesion, ventriculomegaly, extra-axial collection or acute intracranial hemorrhage. Cervicomedullary junction and pituitary are within normal limits. Chronic severe volume loss of the corpus callosum and anterior frontal lobes. Underlying generalized cerebral volume loss. Chronic cortical and white matter T2 and FLAIR hyperintensity along the right cingulate and superior frontal gyri. Chronic confluent periventricular white matter T2 and FLAIR hyperintensity with occasional nodularity. The signal changes appear stable since 2017. No new or restricted white matter lesion is identified. Superimposed mild T2 heterogeneity in the deep gray matter nuclei, greater on the left, is stable. But confluent T2 hyperintensity in the pons has progressed (series 8, images 9 and 10). Subtle FLAIR hyperintensity  in the cerebellar peduncles best seen on sagittal FLAIR has not significantly changed. The cerebellum is otherwise spared. No chronic cerebral blood products. No other cortical encephalomalacia identified. Vascular: Major intracranial vascular flow voids are stable since 2017. The right vertebral artery is dominant. Skull and upper cervical spine: Normal bone marrow signal. Sinuses/Orbits: Orbits soft tissues appear  stable and grossly normal. Mild paranasal sinus mucosal thickening has not significantly changed. Other: Mastoids are clear. Visible internal auditory structures appear normal. Scalp and face soft tissues appear negative. MRI CERVICAL SPINE FINDINGS Alignment: Mild straightening of cervical lordosis. Vertebrae: No marrow edema or evidence of acute osseous abnormality. Visualized bone marrow signal is within normal limits. Cord: Multifocal abnormal cervical spinal cord signal compatible with demyelinating disease. Dominant lesions are identified on series 13, image 7 including the right ventral cord at C2, right hemi cord at C3-C4, and also in the visible upper thoracic spine left hemi cord at T1. No cord expansion, but rather somewhat decreased cord volume. No contrast administered. Posterior Fossa, vertebral arteries, paraspinal tissues: Cervicomedullary junction is within normal limits. Preserved major vascular flow voids in the neck. Negative neck soft tissues. Disc levels: Superimposed significant degenerative changes including C3-C4: Circumferential disc bulge and endplate spurring affecting the neural foramina with moderate bilateral C4 foraminal stenosis. C4-C5: Right eccentric disc bulge and endplate spurring with moderate right C5 foraminal stenosis. C5-C6: Disc space loss with circumferential disc bulge and endplate spurring eccentric to the left. Borderline to mild spinal stenosis. Severe left and moderate right C6 foraminal stenosis. C6-C7: Left eccentric disc bulge and endplate spurring. Moderate to severe left C7 foraminal stenosis. IMPRESSION: 1. The examination was discontinued prior to completion by patient request, and no contrast was administered. 2. Progressed signal abnormality in the pons since 2017, but no active demyelination in the brain identified by diffusion. 3. Otherwise stable intracranial demyelinating disease, including pronounced involvement of the anterior right frontal lobe 4.  Multifocal cervical and upper thoracic spinal cord demyelinating lesions, age indeterminate but perhaps chronic on the basis of associated spinal cord volume loss. 5. Superimposed cervical spine degeneration primarily resulting in neural foraminal stenosis. Electronically Signed   By: Genevie Ann M.D.   On: 03/08/2018 21:31      Subjective:   Discharge Exam: Vitals:   03/09/18 2121 03/10/18 0527  BP: 108/70 118/65  Pulse: (!) 58 (!) 58  Resp: 20 16  Temp: 98.4 F (36.9 C) 97.9 F (36.6 C)  SpO2: 91% 94%   Vitals:   03/09/18 1134 03/09/18 1740 03/09/18 2121 03/10/18 0527  BP: 117/65 114/74 108/70 118/65  Pulse:  (!) 52 (!) 58 (!) 58  Resp:  17 20 16   Temp:  98 F (36.7 C) 98.4 F (36.9 C) 97.9 F (36.6 C)  TempSrc:  Oral Oral Oral  SpO2:  91% 91% 94%  Weight:      Height:        General exam: Appears calm and comfortable  Respiratory system: Clear to auscultation. Respiratory effort normal. Cardiovascular system: Regular rate and rhythm, no murmurs Gastrointestinal system: Soft, nondistended, no rebound or guarding, plus bowel sounds Central nervous system: Alert and oriented. Lateral lower extremity weakness 3 to 4+ out of 5, mildly hyperreflexic in bilateral lower extremities Extremities: Trace lower extremity edema Skin: No rashes visible skin Psychiatry: Judgement and insight appear normal. Mood & affect appropriate.     The results of significant diagnostics from this hospitalization (including imaging, microbiology, ancillary and laboratory) are listed below for reference.  Microbiology: No results found for this or any previous visit (from the past 240 hour(s)).   Labs: BNP (last 3 results) No results for input(s): BNP in the last 8760 hours. Basic Metabolic Panel: Recent Labs  Lab 03/08/18 1354 03/09/18 0325 03/10/18 0803  NA 123* 125* 127*  K 3.7 3.7 3.6  CL 87* 92* 91*  CO2 26 22 22   GLUCOSE 108* 219* 192*  BUN 12 16 19   CREATININE 0.96 0.87  0.88  CALCIUM 8.2* 8.1* 8.4*  MG 1.9  --   --    Liver Function Tests: Recent Labs  Lab 03/08/18 1354 03/09/18 0325  AST 132* 100*  ALT 48* 41  ALKPHOS 56 48  BILITOT 1.0 0.6  PROT 6.9 6.1*  ALBUMIN 3.8 3.3*   No results for input(s): LIPASE, AMYLASE in the last 168 hours. No results for input(s): AMMONIA in the last 168 hours. CBC: Recent Labs  Lab 03/08/18 1354 03/09/18 0325  WBC 6.8 4.2  NEUTROABS 5.4  --   HGB 15.2 14.7  HCT 43.3 41.6  MCV 99.8 95.4  PLT 152 134*   Cardiac Enzymes: No results for input(s): CKTOTAL, CKMB, CKMBINDEX, TROPONINI in the last 168 hours. BNP: Invalid input(s): POCBNP CBG: No results for input(s): GLUCAP in the last 168 hours. D-Dimer No results for input(s): DDIMER in the last 72 hours. Hgb A1c No results for input(s): HGBA1C in the last 72 hours. Lipid Profile No results for input(s): CHOL, HDL, LDLCALC, TRIG, CHOLHDL, LDLDIRECT in the last 72 hours. Thyroid function studies No results for input(s): TSH, T4TOTAL, T3FREE, THYROIDAB in the last 72 hours.  Invalid input(s): FREET3 Anemia work up No results for input(s): VITAMINB12, FOLATE, FERRITIN, TIBC, IRON, RETICCTPCT in the last 72 hours. Urinalysis    Component Value Date/Time   COLORURINE YELLOW 03/08/2018 1648   APPEARANCEUR CLEAR 03/08/2018 1648   LABSPEC 1.015 03/08/2018 1648   PHURINE 6.0 03/08/2018 [place] 03/08/2018 1648   HGBUR NEGATIVE 03/08/2018 1648   BILIRUBINUR NEGATIVE 03/08/2018 1648   KETONESUR 5 (A) 03/08/2018 1648   PROTEINUR NEGATIVE 03/08/2018 1648   NITRITE NEGATIVE 03/08/2018 1648   LEUKOCYTESUR NEGATIVE 03/08/2018 1648   Sepsis Labs Invalid input(s): PROCALCITONIN,  WBC,  LACTICIDVEN Microbiology No results found for this or any previous visit (from the past 240 hour(s)).   Time coordinating discharge: 35  SIGNED:   Cristy Folks, MD  [facility] 03/10/2018, 10:19 AM  If 7PM-7AM, please contact  night-coverage [email or web address] Password [email or web address]

## 2018-03-10 NOTE — Telephone Encounter (Signed)
I looked at the MRI (and compared side by side last one) and didn't feel there was any new change.

## 2018-03-10 NOTE — Telephone Encounter (Signed)
Called, LVM for pt returning his call.   Before you transfer call to me, please schedule him for his next f/u around 07/30/2018. I can then pick up call and discuss MRI's with him.

## 2018-03-10 NOTE — Telephone Encounter (Signed)
Pt called back to inform us that he made a mistake and it is not the [facility] that he needs this Rx escribed to it needs to be the [facility] on [address].

## 2018-03-10 NOTE — Telephone Encounter (Signed)
Pt is needing a refill for his oxyCODONE (OXY IR/ROXICODONE) 5 MG immediate release tablet at the [facility]  on [address]. Pt also states that he was at [facility] for the flu the other day but the neurologist on call at the time ended up doing a partial MRI on him and they have found new Lesions.  Pt would like to know what is the next step on this situation. Please advise.

## 2018-03-10 NOTE — Discharge Instructions (Signed)
Oseltamivir capsules  What is this medicine?  OSELTAMIVIR (os el TAM i vir) is an antiviral medicine. It is used to prevent and to treat some kinds of influenza or the flu. It will not work for colds or other viral infections.  This medicine may be used for other purposes; ask your health care provider or pharmacist if you have questions.  COMMON BRAND NAME(S): Tamiflu  What should I tell my health care provider before I take this medicine?  They need to know if you have any of the following conditions:  -heart disease  -immune system problems  -kidney disease  -liver disease  -lung disease  -an unusual or allergic reaction to oseltamivir, other medicines, foods, dyes, or preservatives  -pregnant or trying to get pregnant  -breast-feeding  How should I use this medicine?  Take this medicine by mouth with a glass of water. Follow the directions on the prescription label. Start this medicine at the first sign of flu symptoms. You can take it with or without food. If it upsets your stomach, take it with food. Take your medicine at regular intervals. Do not take your medicine more often than directed. Take all of your medicine as directed even if you think you are better. Do not skip doses or stop your medicine early.  Talk to your pediatrician regarding the use of this medicine in children. While this drug may be prescribed for children as young as 14 days for selected conditions, precautions do apply.  Overdosage: If you think you have taken too much of this medicine contact a poison control center or emergency room at once.  NOTE: This medicine is only for you. Do not share this medicine with others.  What if I miss a dose?  If you miss a dose, take it as soon as you remember. If it is almost time for your next dose (within 2 hours), take only that dose. Do not take double or extra doses.  What may interact with this medicine?  -  intranasal influenza vaccine  This list may not describe all possible interactions.  Give your health care provider a list of all the medicines, herbs, non-prescription drugs, or dietary supplements you use. Also tell them if you smoke, drink alcohol, or use illegal drugs. Some items may interact with your medicine.  What should I watch for while using this medicine?  Visit your doctor or health care professional for regular check ups. Tell your doctor if your symptoms do not start to get better or if they get worse.  If you have the flu, you may be at an increased risk of developing seizures, confusion, or abnormal behavior. This occurs early in the illness, and more frequently in children and teens. These events are not common, but may result in accidental injury to the patient. Families and caregivers of patients should watch for signs of unusual behavior and contact a doctor or health care professional right away if the patient shows signs of unusual behavior.  This medicine is not a substitute for the flu shot. Talk to your doctor each year about an annual flu shot.  What side effects may I notice from receiving this medicine?  Side effects that you should report to your doctor or health care professional as soon as possible:  -allergic reactions like skin rash, itching or hives, swelling of the face, lips, or tongue  -anxiety, confusion, unusual behavior  -breathing problems  -hallucination, loss of contact with reality  -redness, blistering, peeling   or loosening of the skin, including inside the mouth  -seizures  Side effects that usually do not require medical attention (report to your doctor or health care professional if they continue or are bothersome):  -diarrhea  -headache  -nausea, vomiting  -pain  This list may not describe all possible side effects. Call your doctor for medical advice about side effects. You may report side effects to FDA at 1-800-FDA-1088.  Where should I keep my medicine?  Keep out of the reach of children.  Store at room temperature between 15 and 30 degrees C (59  and 86 degrees F). Throw away any unused medicine after the expiration date.  NOTE: This sheet is a summary. It may not cover all possible information. If you have questions about this medicine, talk to your doctor, pharmacist, or health care provider.   2019 Elsevier/Gold Standard (2016-07-22 16:45:14)

## 2018-03-10 NOTE — Care Management (Signed)
This CM went to speak with pt about DC plan and pt had already been discharged. No orders for home health services received. Marney Doctor RN,BSN [phone]

## 2018-03-10 NOTE — Telephone Encounter (Signed)
Dr. Felecia Shelling- please advise on next steps

## 2018-03-11 MED ORDER — OXYCODONE HCL 5 MG PO TABS: 5.0000 mg | ORAL_TABLET | Freq: Four times a day (QID) | ORAL | 0 refills | Status: DC | PRN

## 2018-03-11 NOTE — Telephone Encounter (Signed)
Pt. has sched. f/u with [facility] on 07/29/18/fim

## 2018-03-11 NOTE — Addendum Note (Signed)
Addended by: France Ravens I on: 03/11/2018 12:49 PM   Modules accepted: Orders

## 2018-03-21 DIAGNOSIS — G35 Multiple sclerosis: Secondary | ICD-10-CM | POA: Diagnosis not present

## 2018-03-21 DIAGNOSIS — W19XXXA Unspecified fall, initial encounter: Secondary | ICD-10-CM | POA: Diagnosis not present

## 2018-03-21 DIAGNOSIS — J111 Influenza due to unidentified influenza virus with other respiratory manifestations: Secondary | ICD-10-CM | POA: Diagnosis not present

## 2018-03-21 DIAGNOSIS — Z09 Encounter for follow-up examination after completed treatment for conditions other than malignant neoplasm: Secondary | ICD-10-CM | POA: Diagnosis not present

## 2018-03-24 ENCOUNTER — Encounter: Payer: Self-pay | Admitting: Neurology

## 2018-03-24 ENCOUNTER — Ambulatory Visit: Payer: Medicare HMO | Admitting: Neurology

## 2018-03-24 VITALS — BP 110/65 | HR 74 | Ht 69.0 in

## 2018-03-24 DIAGNOSIS — R531 Weakness: Secondary | ICD-10-CM | POA: Diagnosis not present

## 2018-03-24 DIAGNOSIS — M21372 Foot drop, left foot: Secondary | ICD-10-CM | POA: Diagnosis not present

## 2018-03-24 DIAGNOSIS — G35 Multiple sclerosis: Secondary | ICD-10-CM | POA: Diagnosis not present

## 2018-03-24 DIAGNOSIS — R261 Paralytic gait: Secondary | ICD-10-CM

## 2018-03-24 DIAGNOSIS — E871 Hypo-osmolality and hyponatremia: Secondary | ICD-10-CM

## 2018-03-24 MED ORDER — PREDNISONE 50 MG PO TABS: ORAL_TABLET | ORAL | 1 refills | Status: DC

## 2018-03-24 NOTE — Progress Notes (Addendum)
[facility]  PATIENT: Vincent Evans DOB: 1950-08-03  REFERRING CLINICIAN: Melinda Crutch HISTORY FROM: Patient  REASON FOR VISIT: Multiple Sclerosis   HISTORICAL  CHIEF COMPLAINT:  Chief Complaint  Patient presents with  . Follow-up    RM 13 with wife. Paper referral from Dr. Harrington Challenger for MS. Last seen 01/27/18. Was at [facility] 03/08/18-03/10/18. He did test positive for influenza A. He is in a wheelchair today and unable to stand to obtain weight. He is very weak, unable to stand/walk by himself. Wife wanting long term steroid use with Dr. Felecia Shelling. He received IV steroids in the hospital and when he was d/c'd home, wife gave him some oral prednisone 5mg  tab she had that was previously prescribed. States she saw improvement when he was on this.   . Steroid    Pt wife expressed dissastifaction when I tried to go over risks of long term steoid use. She was not agreeable with this and demanded Dr. Felecia Shelling prescribe this, stating they will find another provider if he cannot get this. States they may not be able to stay in their home if he cannot get this.  They are wanting steroids for strength/mobility.  . Fall    He fell on 03/08/18 and could not stand. Wife unable to get him off the floor. They called 911 and he was transported by EMS to [facility] on 03/08/18.    HISTORY OF PRESENT ILLNESS:  Vincent Evans is a 68 y.o. man with multiple sclerosis.   He is noting that balance and leg strength are worse.    Update 03/24/2018: He fell 03/08/2018 and was feeling much weaker and he fell.  His wife had trouble getting him up off the floor.  Additionally the wife notes that his urine was much darker.  He went to the ED 03/08/2018 due to the weakness.  He does not think he hit his head.Marland Kitchen  He was admitted and received 3 days of IV SoluMedrol.  MRI of the cervical spine showed multiple lesions.  MRI of the brain also showed multiple T2/flair hyperintense foci, with possible progression in the pons but similar pattern  elsewhere.   At the time he was having fevers (> 100 for one reading and 99.4 during the hospital.   He had influenza A by PCR.       I personally reviewed the MRI of the brain and cervical spine performed 1/14/202020.  I compared the brain MRI to the 03/25/2015 MRI.  The brain shows multiple T2/flair hyperintense foci in the deep and periventricular white matter, pons and right middle cerebellar peduncle.  There are also some changes in the right cingulate region more likely to be ischemic.  For the most part, the MRI looks unchanged compared to the 2017 MRI though there may be just a little more hyperintense signal within the pons.  This could be demyelinating or ischemic or metabolic.  Atrophy is also noted.  The MRI of the cervical spine shows multiple T2 hyperintense foci in a pattern consistent with MS.  Films were not available for comparison.  Update 01/27/2018: He is on Ocrevus.  He tolerates it well.  The last infusion (the third 1) was September 2019.  He has noted some continued worsening of his neurologic function.  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  Ocrevus is the only FDA approved medication for PMS.    He did try biotin and alpha lipoic acid  but had trouble tolerating both of them due to GI distress.  He feels the right leg has caught up to the weakness of the left leg.  Gait is more difficulty.  With a cane and furniture/walls, he can go about 30 feet without stopping.  He will use a wheelchair when outside..  He has Ampyra (actually compounded for aminopyridine)   .  He is not sure how much it is helping but he did notice improvement when he started on it.    He has some urinary symptoms that are stable.  Vision is fine.  He denies any difficulty with cognition.  He notes mild depression but not bad enough to take medication for.  He sleeps well most nights.  Update 07/27/2017: He is on Ocrevus and he tolerates it well.   Last infusion was in March; this was the second dose.Marland Kitchen    He feels weaker, especially over the last week.   He hasn't been able to use the cane and even with a walker he is doing worse.     Both legs are weak, left worse than right.    He was doing PT in the past at [facility] and he feels it helped some in the past.     On April 3, he fell and landed on his knees and arms.    Ribs were bruised but no fractures.   He feels he has done worse with his gait since the fall.    He is on 4-aminopyridine (compounded).  He tolerates it well and feels it  have been helping his gait some.  He has urinary urgency and some urge incontinence.   He has occasional constipation.   patient is fine.  He notes some frustration and irritability at times.  He has tried to quit smoking but with more issues from his MS lately, he went back.  He denies any difficulty with cognition.  Update 01/26/2017:     He had his first split dose of Ocrevus in September.   He tolerated it well and there were no infusion reactions.     He feels he has some days where he is doing a little better with gait/balance.      He also feel his gait may be mildly better on compounded Ampyra.  He use a cane for short distance and his walker for longer.    He has constipation (averaging going every other day).   Bladder function is ok during the day but he has 2 x nocturia.   He feels tired every afternoon.     He snores but has not been noted to   He cut his drinking down.   He is using a Vape instead of cigarettes to an extent (still smokes 15 cigarettes a day).  From 08/31/2016: MS:   Currently, he is on Tecfidera. He tolerates it well but has continued to worsen over the last couple of years. We discussed that he has had a more progressive course, really since the onset of his disease, and primary progressive MS is more likely than a relapsing remitting MS.      Gait/strength/sensation:   He has poor balance and left > right weakness and spasticity.   He  stumbles but no falls.     He now uses a walker for longer distance outside his home but the cane most of the time.    Ampyra seemed to stop helping so he stopped.   He has spasticity, worse first  thing in the morning     He denies numbness in the legs.     Exercise:  He belongs to a gym ([facility]) and they participate in a Silver Sneakers program.  We discussed that therapy and exercises  may be beneficial and I recommend that he considers doing more.     Pain:   He continues to have pain in the right buttock region and leg more than the left    Oxycodone and baclofen helps the pain.     He notes his pain in the legs is worse when he lays down at night, especially down the right leg. He notes more spasticity at night also.    He does not have side effects with the baclofen.  Bladder:   He reports bladder urgency and frequency and has had some incontinence. Oxybutynin and Detrol have only helped mildly.  He has 2 x nocturia most nights.    Vision:  He notes no MS related vision problems.  No diplopia or major acuity issues.   He has readers and other glasses for driving.     Fatigue/Sleep:   He reports fatigue that is physical and cognitive. He does try to stay active but notes that walking is more difficult so he is less active than he was last year.       He falls sleeps fairly well most nights.  He has 2 x nocturia but quickly falls asleep.  He naps most days and feels much better afterwards.   He snores at night. He does have some snorting but no definite pauses in his breathing.    He does not want to get a PSG and would not wear CPAP or an oral appliance.    Mood/cognition   he feels he is doing well with mood and denies any depression. However, he sometimes has some apathy. There is no anxiety.    He feels his cognition is doing well and he does activities on the computer without difficulty.       Other:   He is unable to work. 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   MS History:  Around 2013, he  presented with left leg weakness and gait spasticity and clumsiness. He had MRIs of the brain and spine. Findings were consistent with multiple sclerosis. He was referred to me and I placed him on Tecfidera.  Initially, he had GI symptoms and diarrhea from Tecfidera but this is much better now.    He gets frequent CBC's due to his hematologic disorder and lymphocyte count is usually about 1.0.     REVIEW OF SYSTEMS:  Constitutional: No fevers, chills, sweats, or change in appetite.   He snores and has some insomnia.  He has heat intolerance Eyes: He has notes some visual blurring.  Eyes itch.    He denies double vision, eye pain Ear, nose and throat: No hearing loss, ear pain, nasal congestion, sore throat Cardiovascular: No chest pain, palpitations Respiratory:  He notes cough and occ  SOB GastrointestinaI: No nausea, vomiting, diarrhea, abdominal pain, fecal incontinence Genitourinary:  as above Musculoskeletal:  No neck pain, some back pain Integumentary: No rash, pruritus, skin lesions Neurological: as above.  And has noted RLS Psychiatric: He notes mild depression at this time.  No anxiety Endocrine: No palpitations, diaphoresis, change in appetite, change in weight or increased thirst Hematologic/Lymphatic:  No anemia, purpura, petechiae. Allergic/Immunologic:  No itchy/runny eyes, nasal congestion, recent allergic reactions, rashes  ALLERGIES: No Known Allergies  HOME MEDICATIONS: Outpatient Medications Prior to Visit  Medication Sig Dispense Refill  . aspirin 81 MG tablet Take 81 mg by mouth daily.    Marland Kitchen atorvastatin (LIPITOR) 10 MG tablet Take 10 mg by mouth daily.    . baclofen (LIORESAL) 10 MG tablet TAKE 1 TABLET BY MOUTH IN THE MORNING AND 1 IN THE EVENING AND 2 AT BEDTIME (Patient taking differently: Take 10 mg by mouth See admin instructions. Take 10 mg by mouth in the morning, 10 mg in the  evening and 20 mg at bedtime) 360 tablet 3  . Cholecalciferol (VITAMIN D3) 125 MCG (5000 UT) CAPS Take 5,000 Units by mouth every evening.    . cilostazol (PLETAL) 100 MG tablet Take 100 mg by mouth 2 (two) times daily.     . clotrimazole (LOTRIMIN) 1 % cream Apply 1 application topically 2 (two) times daily as needed (fungal infections). For groin/toes     . Cyanocobalamin (VITAMIN B-12) 5000 MCG TBDP Take 5,000 Units by mouth daily.    . Dalfampridine (4-AMINOPYRIDINE) POWD Take 5 mg by mouth 3 (three) times daily. 270 Bottle 3  . lisinopril-hydrochlorothiazide (PRINZIDE,ZESTORETIC) 20-25 MG tablet Take 1 tablet by mouth daily.  4  . ocrelizumab 600 mg in sodium chloride 0.9 % 500 mL Inject 600 mg into the vein every 6 (six) months.    Marland Kitchen oxyCODONE (OXY IR/ROXICODONE) 5 MG immediate release tablet Take 1 tablet (5 mg total) by mouth every 6 (six) hours as needed for severe pain. 120 tablet 0   No facility-administered medications prior to visit.     PAST MEDICAL HISTORY: Past Medical History:  Diagnosis Date  . Anxiety 12/22/2013  . Hearing loss   . Movement disorder   . Multiple sclerosis ([place])   . Neuromuscular disorder ([place])    MS  . 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 Vision abnormalities     PAST SURGICAL HISTORY: Past Surgical History:  Procedure Laterality Date  . BUNIONECTOMY      FAMILY HISTORY: Family History  Problem Relation Age of Onset  . Dementia Mother   . Diabetes type II Father   . Hypertension Father   . Cancer Maternal Grandmother        pancreatic ca    SOCIAL HISTORY:  Social History   Socioeconomic History  . Marital status: Married    Spouse name: Not on file  . Number of children: Not on file  . Years of education: Not on file  . Highest education level: Not on file  Occupational History  . Not on file  Social Needs  . Financial resource strain: Not on file  . Food insecurity:    Worry: Not on file    Inability: Not on file  .  Transportation needs:    Medical: Not on file    Non-medical: Not on file  Tobacco Use  . Smoking status: Current Every Day Smoker    Packs/day: 0.75    Years: 35.00    Pack years: 26.25    Types: Cigarettes  . Smokeless tobacco: Never Used  Substance and Sexual Activity  . Alcohol use: Yes    Alcohol/week: 0.0 standard drinks    Comment: 3 to 6 oz. of Bourbon daily, and an occasional beer./fim  . Drug use: No  . Sexual activity: Not on file  Lifestyle  . Physical activity:    Days per week: Not on  file    Minutes per session: Not on file  . Stress: Not on file  Relationships  . Social connections:    Talks on phone: Not on file    Gets together: Not on file    Attends religious service: Not on file    Active member of club or organization: Not on file    Attends meetings of clubs or organizations: Not on file    Relationship status: Not on file  . Intimate partner violence:    Fear of current or ex partner: Not on file    Emotionally abused: Not on file    Physically abused: Not on file    Forced sexual activity: Not on file  Other Topics Concern  . Not on file  Social History Narrative  . Not on file     PHYSICAL EXAM  Vitals:   03/24/18 1323  BP: 110/65  Pulse: 74  SpO2: 98%  Height: 5\' 9"  (1.753 m)    Body mass index is 31.01 kg/m.   General: The patient is well-developed and well-nourished and in no acute distress  Neurologic Exam  Mental status: The patient is alert and oriented x 3 at the time of the examination. The patient has apparent normal recent and remote memory, with an apparently normal attention span and concentration ability.   Speech is normal.  Cranial nerves: Extraocular movements are full.  Facial strength is normal.  Trapezius strength is normal.  The tongue is midline, and the patient has symmetric elevation of the soft palate. No obvious hearing deficits are noted.  Motor:  Muscle bulk is normal.  Muscle tone is increased in the  legs, left a little more than the right.  He has normal strength in the arms.  Strength is 2+ prox and 3/5 elesewhere in the right leg and 2-2+/5 in the left leg.    Sensory: He has intact sensation to touch and vibration in the legs.  Coordination: Cerebellar testing reveals good finger-nose-finger bilaterally.  He cannot do heel-to-shin.  Gait and station: Station is stable though he has difficulty rising from the wheelchair.  His gait requires bilateral support.Marland Kitchen He is unable to do tandem walk. Romberg is negative.   Reflexes: Deep tendon reflexes are increased, left greater than right.    DIAGNOSTIC DATA (LABS, IMAGING, TESTING) - I reviewed patient records, labs, notes, testing and imaging myself where available.  Lab Results  Component Value Date   WBC 4.2 03/09/2018   HGB 14.7 03/09/2018   HCT 41.6 03/09/2018   MCV 95.4 03/09/2018   PLT 134 (L) 03/09/2018      ASSESSMENT AND PLAN  Multiple sclerosis ([place]) - Plan: Comprehensive metabolic panel, CBC with Differential/Platelet  Hyponatremia - Plan: Comprehensive metabolic panel, CBC with Differential/Platelet  Spastic gait  Left foot drop  Weakness   1.  He will continue the Ocrevus for his PPMS.  There does appear to be a slight progression of white matter change in the pons though this could be due to demyelination, ischemic change or metabolic change.  I believe that the worsening 2 weeks ago was more likely due to a pseudo-exacerbation from having the flu and hyponatremia rather than an actual exacerbation.  He did feel much better after he received a couple days of steroids though during that time his fever had also resolved.  We will do a once a month 500 mg prednisone bolus for a couple months to see if that offers him some benefit and reduce  fatigue.  We did discuss the long-term issues with prednisone use.   2.  Continue 4-AP for gait  3.  Sodium was very low 2 weeks ago and only recovered to 125 before  discharge.  I will recheck a CMP and CBC with differential today..      4.  Stay active and exercise as tolerated.     5.  RTC 6  months or call sooner if he has new or worsening neurologic symptoms.  40-minute face-to-face evaluation with greater than one half the time counseling coordinating care about his MS progression, recent hospitalization and weakness .  Yobana Culliton A. Felecia Shelling, MD, PhD 08/06/4313, 4:00 PM Certified in Neurology, Clinical Neurophysiology, Sleep Medicine, Pain Medicine and Neuroimaging  [facility] [address], [place], [place] [address] [phone]

## 2018-03-25 ENCOUNTER — Telehealth: Payer: Self-pay | Admitting: *Deleted

## 2018-03-25 LAB — CBC WITH DIFFERENTIAL/PLATELET
Basophils Absolute: 0.1 10*3/uL (ref 0.0–0.2)
Basos: 1 %
EOS (ABSOLUTE): 0.1 10*3/uL (ref 0.0–0.4)
Eos: 1 %
HEMOGLOBIN: 15.3 g/dL (ref 13.0–17.7)
Hematocrit: 44.4 % (ref 37.5–51.0)
Immature Grans (Abs): 0.2 10*3/uL — ABNORMAL HIGH (ref 0.0–0.1)
Immature Granulocytes: 1 %
Lymphocytes Absolute: 1.5 10*3/uL (ref 0.7–3.1)
Lymphs: 11 %
MCH: 33.6 pg — ABNORMAL HIGH (ref 26.6–33.0)
MCHC: 34.5 g/dL (ref 31.5–35.7)
MCV: 97 fL (ref 79–97)
Monocytes Absolute: 1 10*3/uL — ABNORMAL HIGH (ref 0.1–0.9)
Monocytes: 7 %
Neutrophils Absolute: 10.9 10*3/uL — ABNORMAL HIGH (ref 1.4–7.0)
Neutrophils: 79 %
Platelets: 263 10*3/uL (ref 150–450)
RBC: 4.56 x10E6/uL (ref 4.14–5.80)
RDW: 11.9 % (ref 11.6–15.4)
WBC: 13.6 10*3/uL — ABNORMAL HIGH (ref 3.4–10.8)

## 2018-03-25 LAB — COMPREHENSIVE METABOLIC PANEL
ALT: 42 IU/L (ref 0–44)
AST: 25 IU/L (ref 0–40)
Albumin/Globulin Ratio: 1.8 (ref 1.2–2.2)
Albumin: 4.2 g/dL (ref 3.8–4.8)
Alkaline Phosphatase: 125 IU/L — ABNORMAL HIGH (ref 39–117)
BILIRUBIN TOTAL: 1 mg/dL (ref 0.0–1.2)
BUN/Creatinine Ratio: 7 — ABNORMAL LOW (ref 10–24)
BUN: 6 mg/dL — ABNORMAL LOW (ref 8–27)
CO2: 23 mmol/L (ref 20–29)
Calcium: 9.2 mg/dL (ref 8.6–10.2)
Chloride: 93 mmol/L — ABNORMAL LOW (ref 96–106)
Creatinine, Ser: 0.9 mg/dL (ref 0.76–1.27)
GFR calc non Af Amer: 88 mL/min/{1.73_m2} (ref >59)
GFR, EST AFRICAN AMERICAN: 102 mL/min/{1.73_m2} (ref >59)
Globulin, Total: 2.3 g/dL (ref 1.5–4.5)
Glucose: 92 mg/dL (ref 65–99)
Potassium: 4.5 mmol/L (ref 3.5–5.2)
Sodium: 133 mmol/L — ABNORMAL LOW (ref 134–144)
TOTAL PROTEIN: 6.5 g/dL (ref 6.0–8.5)

## 2018-03-25 NOTE — Telephone Encounter (Signed)
-----   Message from Britt Bottom, MD sent at 03/25/2018 12:32 PM EST ----- Please let the patient know that the lab work is better.  The sodium is now 133 (134-144 is normal and it was much lower at 123,  2 weeks ago)

## 2018-03-25 NOTE — Telephone Encounter (Signed)
Spoke with Les and reviewed below lab results.  He verbalized understanding of same/fim

## 2018-04-19 ENCOUNTER — Other Ambulatory Visit: Payer: Self-pay | Admitting: Neurology

## 2018-04-19 DIAGNOSIS — G35 Multiple sclerosis: Secondary | ICD-10-CM

## 2018-04-19 MED ORDER — OXYCODONE HCL 5 MG PO TABS: ORAL_TABLET | ORAL | 0 refills | Status: DC

## 2018-04-19 NOTE — Telephone Encounter (Signed)
Pt has called for a refill on his oxyCODONE (OXY IR/ROXICODONE) 5 MG immediate release tablet °[id number]  °

## 2018-04-19 NOTE — Addendum Note (Signed)
Addended by: Hope Pigeon on: 04/19/2018 09:43 AM   Modules accepted: Orders

## 2018-05-17 ENCOUNTER — Other Ambulatory Visit: Payer: Self-pay | Admitting: *Deleted

## 2018-05-17 DIAGNOSIS — G35 Multiple sclerosis: Secondary | ICD-10-CM | POA: Diagnosis not present

## 2018-05-17 MED ORDER — OXYCODONE HCL 5 MG PO TABS: ORAL_TABLET | ORAL | 0 refills | Status: DC

## 2018-05-17 NOTE — Telephone Encounter (Signed)
Drug Registry checked.  Last filled 04-19-18 [facility].  He does not have F/u scheduled yet. In today with intrafusion.

## 2018-06-08 DIAGNOSIS — I10 Essential (primary) hypertension: Secondary | ICD-10-CM | POA: Diagnosis not present

## 2018-06-08 DIAGNOSIS — M810 Age-related osteoporosis without current pathological fracture: Secondary | ICD-10-CM | POA: Diagnosis not present

## 2018-06-08 DIAGNOSIS — E782 Mixed hyperlipidemia: Secondary | ICD-10-CM | POA: Diagnosis not present

## 2018-06-16 ENCOUNTER — Other Ambulatory Visit: Payer: Self-pay | Admitting: Neurology

## 2018-06-16 ENCOUNTER — Telehealth: Payer: Self-pay | Admitting: Neurology

## 2018-06-16 DIAGNOSIS — G35 Multiple sclerosis: Secondary | ICD-10-CM

## 2018-06-16 MED ORDER — OXYCODONE HCL 5 MG PO TABS: ORAL_TABLET | ORAL | 0 refills | Status: DC

## 2018-06-16 MED ORDER — DALFAMPRIDINE ER 10 MG PO TB12: 10.0000 mg | ORAL_TABLET | Freq: Two times a day (BID) | ORAL | 11 refills | Status: DC

## 2018-06-16 NOTE — Telephone Encounter (Signed)
Pt caled in for a refill of   oxyCODONE (OXY IR/ROXICODONE) 5 MG immediate release tablet to [id number] [place] [place] [address] [facility] [address]

## 2018-06-16 NOTE — Telephone Encounter (Signed)
I called pt to discuss. No answer, left a message asking him to call me back. 

## 2018-06-16 NOTE — Telephone Encounter (Signed)
Pt is up to date on his appts. Pt is due for a refill on oxycodone. [place] Controlled Substance Registry checked.

## 2018-06-16 NOTE — Telephone Encounter (Signed)
Yes we can send it to that pharmacy 10 mg po q12 hours   #60  #11 refills  If not already doing so, he should bring a goodrx coupon or use the app

## 2018-06-16 NOTE — Telephone Encounter (Signed)
Pt states that the  Ampyra has gone generic now and he can afford it with the price it is now. He would like to replace the Dalfampridine (4-AMINOPYRIDINE) POWD with it. Pt would like it sent to the [facility] on [address]. The phone number is [phone] Pt would like a call when this has been called in. Please advise.

## 2018-06-20 NOTE — Telephone Encounter (Signed)
I called pt again. Pt says that he has already picked up generic ampyra and he is ok with paying $53/month for it. Pt had no questions at this time but was encouraged to call back if questions arise.

## 2018-07-14 ENCOUNTER — Other Ambulatory Visit: Payer: Self-pay | Admitting: Neurology

## 2018-07-14 DIAGNOSIS — G35 Multiple sclerosis: Secondary | ICD-10-CM

## 2018-07-14 MED ORDER — OXYCODONE HCL 5 MG PO TABS: ORAL_TABLET | ORAL | 0 refills | Status: DC

## 2018-07-14 NOTE — Telephone Encounter (Signed)
Pt has called for a refill on his  oxyCODONE (OXY IR/ROXICODONE) 5 MG immediate release tablet  [id number]

## 2018-07-14 NOTE — Telephone Encounter (Signed)
Called pt. He was last seen 02/2018 and Dr. Felecia Shelling wanted him to f/u in 6 months. He had no pending appt. I spoke with him and scheduled one for 09/28/18 at 10am. He wrote down appt date/time. Advised I will send refill request to Dr. Felecia Shelling. He verbalized understanding.  Checked drug registry. He last refilled 06/16/18 #120.

## 2018-07-14 NOTE — Addendum Note (Signed)
Addended by: Rossie Muskrat L on: 07/14/2018 11:53 AM   Modules accepted: Orders

## 2018-07-25 ENCOUNTER — Other Ambulatory Visit: Payer: Self-pay | Admitting: Neurology

## 2018-07-29 ENCOUNTER — Ambulatory Visit: Payer: Medicare HMO | Admitting: Neurology

## 2018-08-01 ENCOUNTER — Other Ambulatory Visit: Payer: Self-pay | Admitting: Neurology

## 2018-08-01 ENCOUNTER — Telehealth: Payer: Self-pay | Admitting: Neurology

## 2018-08-01 MED ORDER — PREDNISONE 20 MG PO TABS: ORAL_TABLET | ORAL | 1 refills | Status: DC

## 2018-08-01 NOTE — Telephone Encounter (Signed)
Please let him know that a prescription was called in for prednisone.  I sent in the 20 mg tablets (they do not make 25 mg)

## 2018-08-01 NOTE — Telephone Encounter (Signed)
Pt has called for a refill on his predniSONE (DELTASONE) 50 MG tablet [id number]  Pt would like the MG to go to 25mg .  Please call

## 2018-08-01 NOTE — Telephone Encounter (Signed)
Dr. Sater- please advise 

## 2018-08-01 NOTE — Telephone Encounter (Signed)
I called pt back. Relayed Dr. Garth Bigness message. He verbalized understanding.

## 2018-08-15 ENCOUNTER — Telehealth: Payer: Self-pay | Admitting: Neurology

## 2018-08-15 DIAGNOSIS — G35 Multiple sclerosis: Secondary | ICD-10-CM

## 2018-08-15 MED ORDER — OXYCODONE HCL 5 MG PO TABS: ORAL_TABLET | ORAL | 0 refills | Status: DC

## 2018-08-15 NOTE — Telephone Encounter (Signed)
Patient calling for refill. oxyCODONE (OXY IR/ROXICODONE) 5 MG immediate release tablet  [facility] [address] [facility].

## 2018-08-15 NOTE — Telephone Encounter (Signed)
The prescription for oxycodone will be sent in.

## 2018-08-15 NOTE — Telephone Encounter (Signed)
[place] drug registry verified. Last refill was 07/14/18 # 120 for a 30 day supply by Dr. Felecia Shelling.

## 2018-08-15 NOTE — Addendum Note (Signed)
Addended by: Kathrynn Ducking on: 08/15/2018 01:52 PM   Modules accepted: Orders

## 2018-08-25 DIAGNOSIS — R69 Illness, unspecified: Secondary | ICD-10-CM | POA: Diagnosis not present

## 2018-09-04 ENCOUNTER — Other Ambulatory Visit: Payer: Self-pay | Admitting: Neurology

## 2018-09-04 DIAGNOSIS — D751 Secondary polycythemia: Secondary | ICD-10-CM

## 2018-09-13 ENCOUNTER — Other Ambulatory Visit: Payer: Self-pay | Admitting: Neurology

## 2018-09-13 DIAGNOSIS — G35 Multiple sclerosis: Secondary | ICD-10-CM

## 2018-09-13 MED ORDER — OXYCODONE HCL 5 MG PO TABS: ORAL_TABLET | ORAL | 0 refills | Status: DC

## 2018-09-13 NOTE — Telephone Encounter (Signed)
Pt has called for a refill on his oxyCODONE (OXY IR/ROXICODONE) 5 MG immediate release tablet [id number]

## 2018-09-13 NOTE — Addendum Note (Signed)
Addended by: Hope Pigeon on: 09/13/2018 11:42 AM   Modules accepted: Orders

## 2018-09-28 ENCOUNTER — Encounter: Payer: Self-pay | Admitting: Neurology

## 2018-09-28 ENCOUNTER — Ambulatory Visit (INDEPENDENT_AMBULATORY_CARE_PROVIDER_SITE_OTHER): Payer: Medicare HMO | Admitting: Neurology

## 2018-09-28 ENCOUNTER — Other Ambulatory Visit: Payer: Self-pay

## 2018-09-28 VITALS — BP 123/67 | HR 84 | Temp 98.7°F | Ht 69.0 in

## 2018-09-28 DIAGNOSIS — R3915 Urgency of urination: Secondary | ICD-10-CM | POA: Diagnosis not present

## 2018-09-28 DIAGNOSIS — G35 Multiple sclerosis: Secondary | ICD-10-CM | POA: Diagnosis not present

## 2018-09-28 DIAGNOSIS — M21372 Foot drop, left foot: Secondary | ICD-10-CM

## 2018-09-28 DIAGNOSIS — R261 Paralytic gait: Secondary | ICD-10-CM | POA: Diagnosis not present

## 2018-09-28 DIAGNOSIS — G35D Multiple sclerosis, unspecified: Secondary | ICD-10-CM

## 2018-09-28 DIAGNOSIS — Z79899 Other long term (current) drug therapy: Secondary | ICD-10-CM | POA: Diagnosis not present

## 2018-09-28 DIAGNOSIS — R300 Dysuria: Secondary | ICD-10-CM | POA: Diagnosis not present

## 2018-09-28 MED ORDER — TAMSULOSIN HCL 0.4 MG PO CAPS: 0.4000 mg | ORAL_CAPSULE | Freq: Every day | ORAL | 11 refills | Status: DC

## 2018-09-28 MED ORDER — PREDNISONE 5 MG PO TABS: ORAL_TABLET | ORAL | 1 refills | Status: DC

## 2018-09-28 NOTE — Patient Instructions (Signed)
PREDNISONE 5 MG TABLETS  For 2 weeks take 3 pills po daily For the next 2 weeks take 2 pills  For the next 2 weeks take 1 pill po daily For the next 2 weeks take 1/2 pill po daily Then STOP

## 2018-09-28 NOTE — Progress Notes (Signed)
[facility]  PATIENT: Vincent Evans DOB: June 04, 1950  REFERRING CLINICIAN: Melinda Crutch HISTORY FROM: Patient  REASON FOR VISIT: Multiple Sclerosis   HISTORICAL  CHIEF COMPLAINT:  Chief Complaint  Patient presents with  . Follow-up    RM 12, with wife. Last seen 03/24/2018  . Multiple Sclerosis    On Ocrevus for PPMS. Last infusion: 05/17/18. Next infusion: 11/17/18 at 10am here at [facility]. Gave them appt card reminder. Taking 4-AP for gait and prednisone 20mg   . Gait Problem    In WC in office today. Unable to stand to get weight per pt. He reports he weighed himself at home yesterday on digital scale/no clothes at 231.5 lb    HISTORY OF PRESENT ILLNESS:  [name] is a 68 y.o. man with multiple sclerosis.   He is noting that balance and leg strength are worse.    Update 09/28/2018: He is on Ocrevus for PPMS.   He tolerates it well.   His right leg has been doing worse over the last year, especially the last 6 months.  He can only go a few steps with a walker.  He is also taking prednisone 20 mg.   We had intended that he takes it as a taper but he has been taking one a day.  He feels better on prednisone.     He has more bladder issues. He has urge incontinence and nocturia.    He notes more straining.   He takes probiotics and fiber without much benefit.    He goes 1-2 times a day.    He is noting a lot more hiccups  He is noting a lot more edema in his legs.   He has trouble getting the compression stocking on.   He has a machine to help apply pressure.   He gets muscle cramps in his legs but there is not a lot of pain or any redness/heat.  He is on HCTZ 25 mg mixed with lisinopril .  We discussed Lasix may help the   Update 03/24/2018: He fell 03/08/2018 and was feeling much weaker and he fell.  His wife had trouble getting him up off the floor.  Additionally the wife notes that his urine was much darker.  He went to the ED 03/08/2018 due to the weakness.  He does not  think he hit his head.Marland Kitchen  He was admitted and received 3 days of IV SoluMedrol.  MRI of the cervical spine showed multiple lesions.  MRI of the brain also showed multiple T2/flair hyperintense foci, with possible progression in the pons but similar pattern elsewhere.   At the time he was having fevers (> 100 for one reading and 99.4 during the hospital.   He had influenza A by PCR.       I personally reviewed the MRI of the brain and cervical spine performed 1/14/202020.  I compared the brain MRI to the 03/25/2015 MRI.  The brain shows multiple T2/flair hyperintense foci in the deep and periventricular white matter, pons and right middle cerebellar peduncle.  There are also some changes in the right cingulate region more likely to be ischemic.  For the most part, the MRI looks unchanged compared to the 2017 MRI though there may be just a little more hyperintense signal within the pons.  This could be demyelinating or ischemic or metabolic.  Atrophy is also noted.  The MRI of the cervical spine shows multiple T2 hyperintense foci in a pattern consistent with MS.  Films were not available for comparison.  Update 01/27/2018: He is on Ocrevus.  He tolerates it well.  The last infusion (the third 1) was September 2019.  He has noted some continued worsening of his neurologic function.  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  Ocrevus is the only FDA approved medication for PMS.    He did try biotin and alpha lipoic acid but had trouble tolerating both of them due to GI distress.  He feels the right leg has caught up to the weakness of the left leg.  Gait is more difficulty.  With a cane and furniture/walls, he can go about 30 feet without stopping.  He will use a wheelchair when outside..  He has Ampyra (actually compounded for aminopyridine)   .  He is not sure how much it is helping but he did notice improvement when he started on it.    He has some  urinary symptoms that are stable.  Vision is fine.  He denies any difficulty with cognition.  He notes mild depression but not bad enough to take medication for.  He sleeps well most nights.  Update 07/27/2017: He is on Ocrevus and he tolerates it well.  Last infusion was in March; this was the second dose.Marland Kitchen    He feels weaker, especially over the last week.   He hasn't been able to use the cane and even with a walker he is doing worse.     Both legs are weak, left worse than right.    He was doing PT in the past at [facility] and he feels it helped some in the past.     On April 3, he fell and landed on his knees and arms.    Ribs were bruised but no fractures.   He feels he has done worse with his gait since the fall.    He is on 4-aminopyridine (compounded).  He tolerates it well and feels it  have been helping his gait some.  He has urinary urgency and some urge incontinence.   He has occasional constipation.   patient is fine.  He notes some frustration and irritability at times.  He has tried to quit smoking but with more issues from his MS lately, he went back.  He denies any difficulty with cognition.  Update 01/26/2017:     He had his first split dose of Ocrevus in September.   He tolerated it well and there were no infusion reactions.     He feels he has some days where he is doing a little better with gait/balance.      He also feel his gait may be mildly better on compounded Ampyra.  He use a cane for short distance and his walker for longer.    He has constipation (averaging going every other day).   Bladder function is ok during the day but he has 2 x nocturia.   He feels tired every afternoon.     He snores but has not been noted to   He cut his drinking down.   He is using a Vape instead of cigarettes to an extent (still smokes 15 cigarettes a day).  From 08/31/2016: MS:   Currently, he is on Tecfidera. He tolerates it well but has continued to worsen over the last couple of years. We discussed  that he has had a more progressive course, really since the onset of his disease,  and primary progressive MS is more likely than a relapsing remitting MS.      Gait/strength/sensation:   He has poor balance and left > right weakness and spasticity.   He stumbles but no falls.     He now uses a walker for longer distance outside his home but the cane most of the time.    Ampyra seemed to stop helping so he stopped.   He has spasticity, worse first thing in the morning     He denies numbness in the legs.     Exercise:  He belongs to a gym ([facility]) and they participate in a Silver Sneakers program.  We discussed that therapy and exercises  may be beneficial and I recommend that he considers doing more.     Pain:   He continues to have pain in the right buttock region and leg more than the left    Oxycodone and baclofen helps the pain.     He notes his pain in the legs is worse when he lays down at night, especially down the right leg. He notes more spasticity at night also.    He does not have side effects with the baclofen.  Bladder:   He reports bladder urgency and frequency and has had some incontinence. Oxybutynin and Detrol have only helped mildly.  He has 2 x nocturia most nights.    Vision:  He notes no MS related vision problems.  No diplopia or major acuity issues.   He has readers and other glasses for driving.     Fatigue/Sleep:   He reports fatigue that is physical and cognitive. He does try to stay active but notes that walking is more difficult so he is less active than he was last year.       He falls sleeps fairly well most nights.  He has 2 x nocturia but quickly falls asleep.  He naps most days and feels much better afterwards.   He snores at night. He does have some snorting but no definite pauses in his breathing.    He does not want to get a PSG and would not wear CPAP or an oral appliance.    Mood/cognition   he feels he is doing well with mood and denies any depression. However, he  sometimes has some apathy. There is no anxiety.    He feels his cognition is doing well and he does activities on the computer without difficulty.       Other:   He is unable to work. 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   MS History:  Around 2013, he  presented with left leg weakness and gait spasticity and clumsiness. He had MRIs of the brain and spine. Findings were consistent with multiple sclerosis. He was referred to me and I placed him on Tecfidera.  Initially, he had GI symptoms and diarrhea from Tecfidera but this is much better now.    He gets frequent CBC's due to his hematologic disorder and lymphocyte count is usually about 1.0.     REVIEW OF SYSTEMS:  Constitutional: No fevers, chills, sweats, or change in appetite.   He snores and has some insomnia.  He has heat intolerance Eyes: He has notes some visual blurring.  Eyes itch.    He denies double vision, eye pain Ear, nose and throat: No hearing loss, ear pain, nasal congestion, sore throat Cardiovascular: No  chest pain, palpitations Respiratory:  He notes cough and occ  SOB GastrointestinaI: No nausea, vomiting, diarrhea, abdominal pain, fecal incontinence Genitourinary:  as above Musculoskeletal:  No neck pain, some back pain Integumentary: No rash, pruritus, skin lesions Neurological: as above.  And has noted RLS Psychiatric: He notes mild depression at this time.  No anxiety Endocrine: No palpitations, diaphoresis, change in appetite, change in weight or increased thirst Hematologic/Lymphatic:  No anemia, purpura, petechiae. Allergic/Immunologic: No itchy/runny eyes, nasal congestion, recent allergic reactions, rashes  ALLERGIES: No Known Allergies  HOME MEDICATIONS: Outpatient Medications Prior to Visit  Medication Sig Dispense Refill  . aspirin 81 MG tablet Take 81 mg by mouth daily.    Marland Kitchen atorvastatin (LIPITOR) 10 MG tablet Take 10  mg by mouth daily.    . baclofen (LIORESAL) 10 MG tablet TAKE 1 TABLET BY MOUTH IN THE MORNING AND 1 IN THE EVENING AND 2 AT BEDTIME 360 tablet 3  . Cholecalciferol (VITAMIN D3) 125 MCG (5000 UT) CAPS Take 5,000 Units by mouth every evening.    . cilostazol (PLETAL) 100 MG tablet Take 100 mg by mouth 2 (two) times daily.     . Cyanocobalamin (VITAMIN B-12) 5000 MCG TBDP Take 5,000 Units by mouth daily.    Marland Kitchen dalfampridine 10 MG TB12 Take 1 tablet (10 mg total) by mouth every 12 (twelve) hours. 60 tablet 11  . lisinopril-hydrochlorothiazide (PRINZIDE,ZESTORETIC) 20-25 MG tablet Take 1 tablet by mouth daily.  4  . Multiple Vitamins-Minerals (AIRBORNE PO) Take 1 Dose by mouth daily.    Marland Kitchen ocrelizumab 600 mg in sodium chloride 0.9 % 500 mL Inject 600 mg into the vein every 6 (six) months.    Marland Kitchen oxyCODONE (OXY IR/ROXICODONE) 5 MG immediate release tablet Take 1 tablet every 6 hours as needed for severe pain. CALL [phone] TO SCHEDULE F/U AROUND 09/22/18 FOR ONGOING REFILLS 120 tablet 0  . predniSONE (DELTASONE) 20 MG tablet Take as directed for MS exacerbation.   Prescription should last 3 months 30 tablet 1  . QUERCETIN PO Take 2,000 mg by mouth daily.    Marland Kitchen VITAMIN E PO Take 162 mg by mouth daily.    . clotrimazole (LOTRIMIN) 1 % cream Apply 1 application topically 2 (two) times daily as needed (fungal infections). For groin/toes      No facility-administered medications prior to visit.     PAST MEDICAL HISTORY: Past Medical History:  Diagnosis Date  . Anxiety 12/22/2013  . Hearing loss   . Movement disorder   . Multiple sclerosis ([place])   . Neuromuscular disorder ([place])    MS  . 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 Vision abnormalities     PAST SURGICAL HISTORY: Past Surgical History:  Procedure Laterality Date  . BUNIONECTOMY      FAMILY HISTORY: Family History  Problem Relation Age of Onset  . Dementia Mother   . Diabetes type II Father   . Hypertension Father   . Cancer  Maternal Grandmother        pancreatic ca    SOCIAL HISTORY:  Social History   Socioeconomic History  . Marital status: Married    Spouse name: Not on file  . Number of children: Not on file  . Years of education: Not on file  . Highest education level: Not on file  Occupational History  . Not on file  Social Needs  . Financial resource strain: Not on file  . Food insecurity    Worry: Not on file  Inability: Not on file  . Transportation needs    Medical: Not on file    Non-medical: Not on file  Tobacco Use  . Smoking status: Current Every Day Smoker    Packs/day: 0.75    Years: 35.00    Pack years: 26.25    Types: Cigarettes  . Smokeless tobacco: Never Used  Substance and Sexual Activity  . Alcohol use: Yes    Alcohol/week: 0.0 standard drinks    Comment: 3 to 6 oz. of Bourbon daily, and an occasional beer./fim  . Drug use: No  . Sexual activity: Not on file  Lifestyle  . Physical activity    Days per week: Not on file    Minutes per session: Not on file  . Stress: Not on file  Relationships  . Social Herbalist on phone: Not on file    Gets together: Not on file    Attends religious service: Not on file    Active member of club or organization: Not on file    Attends meetings of clubs or organizations: Not on file    Relationship status: Not on file  . Intimate partner violence    Fear of current or ex partner: Not on file    Emotionally abused: Not on file    Physically abused: Not on file    Forced sexual activity: Not on file  Other Topics Concern  . Not on file  Social History Narrative  . Not on file     PHYSICAL EXAM  Vitals:   09/28/18 0959  BP: 123/67  Pulse: 84  Temp: 98.7 F (37.1 C)  Height: 5\' 9"  (1.753 m)    Body mass index is 31.01 kg/m.   General: The patient is well-developed and well-nourished and in no acute distress.  Much more edema in legs, right > left than last visit  Neurologic Exam  Mental status:  The patient is alert and oriented x 3 at the time of the examination. The patient has apparent normal recent and remote memory, with an apparently normal attention span and concentration ability.   Speech is normal.  Cranial nerves: Extraocular movements are full.  Facial strength is normal.  Trapezius strength is normal.  The tongue is midline, and the patient has symmetric elevation of the soft palate. No obvious hearing deficits are noted.  Motor:  Muscle bulk is normal.  Muscle tone is increased in the legs, left a little more than the right.  He has normal strength in the arms.  Strength is 2 prox and 2+/5 elesewhere in the right leg and 2-2+/5 in the left leg.    Sensory: He has intact sensation to touch and vibration in the legs.  Coordination: Cerebellar testing reveals good finger-nose-finger bilaterally.  He cannot do heel-to-shin.  Gait and station: He has difficulty rising from the wheelchair.  He needs strong bilat support.   Reflexes: Deep tendon reflexes are increased, left greater than right.    DIAGNOSTIC DATA (LABS, IMAGING, TESTING) - I reviewed patient records, labs, notes, testing and imaging myself where available.  Lab Results  Component Value Date   WBC 13.6 (H) 03/24/2018   HGB 15.3 03/24/2018   HCT 44.4 03/24/2018   MCV 97 03/24/2018   PLT 263 03/24/2018      ASSESSMENT AND PLAN    1. Multiple sclerosis (HCC)   2. Spastic gait   3. Left foot drop   4. Urinary urgency   5. Burning with  urination   6. High risk medication use     1.  He will continue the Ocrevus for his PPMS for one more dose to determine if the slope of his progressoin is changing --- if not consider stopping.   He is taking prednisone and feels it helps but with s.e. we need to try to taper off and perhaps consider occasional steroid boluses if he gets a benefit.   2.  Continue 4-AP for gait  3.  He has edema.   He has had low sodium in the past.  Consider lasix but due to his h/o  low sodium, advised to discuss further with PCP.   Continue stockings/machine for compression    4.  Stay active and exercise as tolerated.     5.  Tamsulosin for retention.  Check UA and culture. 6.   RTC 6  months or call sooner if he has new or worsening neurologic symptoms.  40 minute face-to-face evaluation with greater than one half the time counseling coordinating care about his MS progression,  Weakness, edema and bowel/bladder issues.    Deyani Hegarty A. Felecia Shelling, MD, PhD 2/0/8022, 33:61 AM Certified in Neurology, Clinical Neurophysiology, Sleep Medicine, Pain Medicine and Neuroimaging  [facility] [address] [place], [place] [address] [phone]

## 2018-09-29 LAB — CBC WITH DIFFERENTIAL/PLATELET
Basophils Absolute: 0.1 10*3/uL (ref 0.0–0.2)
Basos: 1 %
EOS (ABSOLUTE): 0.1 10*3/uL (ref 0.0–0.4)
Eos: 0 %
Hematocrit: 39.4 % (ref 37.5–51.0)
Hemoglobin: 14.4 g/dL (ref 13.0–17.7)
Immature Grans (Abs): 0.3 10*3/uL — ABNORMAL HIGH (ref 0.0–0.1)
Immature Granulocytes: 2 %
Lymphocytes Absolute: 2.1 10*3/uL (ref 0.7–3.1)
Lymphs: 14 %
MCH: 35.4 pg — ABNORMAL HIGH (ref 26.6–33.0)
MCHC: 36.5 g/dL — ABNORMAL HIGH (ref 31.5–35.7)
MCV: 97 fL (ref 79–97)
Monocytes Absolute: 1.3 10*3/uL — ABNORMAL HIGH (ref 0.1–0.9)
Monocytes: 8 %
Neutrophils Absolute: 11.6 10*3/uL — ABNORMAL HIGH (ref 1.4–7.0)
Neutrophils: 75 %
Platelets: 273 10*3/uL (ref 150–450)
RBC: 4.07 x10E6/uL — ABNORMAL LOW (ref 4.14–5.80)
RDW: 11.4 % — ABNORMAL LOW (ref 11.6–15.4)
WBC: 15.4 10*3/uL — ABNORMAL HIGH (ref 3.4–10.8)

## 2018-09-29 LAB — COMPREHENSIVE METABOLIC PANEL
ALT: 23 IU/L (ref 0–44)
AST: 18 IU/L (ref 0–40)
Albumin/Globulin Ratio: 1.9 (ref 1.2–2.2)
Albumin: 4.4 g/dL (ref 3.8–4.8)
Alkaline Phosphatase: 66 IU/L (ref 39–117)
BUN/Creatinine Ratio: 10 (ref 10–24)
BUN: 11 mg/dL (ref 8–27)
Bilirubin Total: 0.5 mg/dL (ref 0.0–1.2)
CO2: 21 mmol/L (ref 20–29)
Calcium: 10 mg/dL (ref 8.6–10.2)
Chloride: 92 mmol/L — ABNORMAL LOW (ref 96–106)
Creatinine, Ser: 1.09 mg/dL (ref 0.76–1.27)
GFR calc Af Amer: 80 mL/min/{1.73_m2} (ref >59)
GFR calc non Af Amer: 69 mL/min/{1.73_m2} (ref >59)
Globulin, Total: 2.3 g/dL (ref 1.5–4.5)
Glucose: 129 mg/dL — ABNORMAL HIGH (ref 65–99)
Potassium: 4.5 mmol/L (ref 3.5–5.2)
Sodium: 134 mmol/L (ref 134–144)
Total Protein: 6.7 g/dL (ref 6.0–8.5)

## 2018-09-29 LAB — IGG, IGA, IGM
IgA/Immunoglobulin A, Serum: 238 mg/dL (ref 61–437)
IgG (Immunoglobin G), Serum: 724 mg/dL (ref 603–1613)
IgM (Immunoglobulin M), Srm: 72 mg/dL (ref 20–172)

## 2018-10-03 ENCOUNTER — Telehealth: Payer: Self-pay | Admitting: *Deleted

## 2018-10-03 NOTE — Telephone Encounter (Signed)
Called pt back. Advised ok to stop Ocrevus infusions per Dr. Felecia Shelling. I sent message to Liane, RN (intrafusion) to cx appt 10/2018 for infusion. Patient will keep 6 month f/u with Dr. Felecia Shelling 03/2019. Nothing further needed.

## 2018-10-03 NOTE — Telephone Encounter (Signed)
-----   Message from Britt Bottom, MD sent at 10/02/2018  3:37 PM EDT ----- The IgG/IgM testd for Ocrevus looked good.  His WBC was elevated --- We ordered a urinalysis and Urine Cx but I don't have any results.   Did he do this?   If so see if [facility] has results

## 2018-10-03 NOTE — Telephone Encounter (Signed)
I called and spoke with pt. Relayed results per Dr. Felecia Shelling note. He verbalized understanding. He was unable to give Urine sample while here for office visit. He has PCP f/u tomorrow with Dr. Harrington Challenger and will complete then.   He also mentioned that he discussed with Dr. Felecia Shelling at [facility] about stopping Ocrevus. Has appt 11/17/2018 for infusion and wants to cx this. They discussed possible SE of infection. Does not feel benefit outweighs risk. Advised I will speak with Dr. Felecia Shelling and call back to let him know how to proceed. He verbalized understanding.

## 2018-10-04 DIAGNOSIS — I10 Essential (primary) hypertension: Secondary | ICD-10-CM | POA: Diagnosis not present

## 2018-10-04 DIAGNOSIS — N3090 Cystitis, unspecified without hematuria: Secondary | ICD-10-CM | POA: Diagnosis not present

## 2018-10-04 DIAGNOSIS — R300 Dysuria: Secondary | ICD-10-CM | POA: Diagnosis not present

## 2018-10-04 DIAGNOSIS — R0609 Other forms of dyspnea: Secondary | ICD-10-CM | POA: Diagnosis not present

## 2018-10-04 DIAGNOSIS — R609 Edema, unspecified: Secondary | ICD-10-CM | POA: Diagnosis not present

## 2018-10-13 ENCOUNTER — Other Ambulatory Visit: Payer: Self-pay | Admitting: Neurology

## 2018-10-13 DIAGNOSIS — G35 Multiple sclerosis: Secondary | ICD-10-CM

## 2018-10-13 MED ORDER — OXYCODONE HCL 5 MG PO TABS: ORAL_TABLET | ORAL | 0 refills | Status: DC

## 2018-10-13 NOTE — Telephone Encounter (Signed)
Checked drug registry. He last refilled 09/14/18 #120. Post-dated to fill on or after 10/14/18. Last seen 09/28/18 and next f/u 04/03/19

## 2018-10-13 NOTE — Telephone Encounter (Signed)
Pt has called for a refill on oxyCODONE (OXY IR/ROXICODONE) 5 MG immediate release tablet  [id number]

## 2018-10-13 NOTE — Addendum Note (Signed)
Addended by: Rossie Muskrat L on: 10/13/2018 11:09 AM   Modules accepted: Orders

## 2018-10-26 DIAGNOSIS — B379 Candidiasis, unspecified: Secondary | ICD-10-CM | POA: Diagnosis not present

## 2018-10-26 DIAGNOSIS — G2581 Restless legs syndrome: Secondary | ICD-10-CM | POA: Diagnosis not present

## 2018-10-26 DIAGNOSIS — Z79899 Other long term (current) drug therapy: Secondary | ICD-10-CM | POA: Diagnosis not present

## 2018-10-26 DIAGNOSIS — R3911 Hesitancy of micturition: Secondary | ICD-10-CM | POA: Diagnosis not present

## 2018-10-26 DIAGNOSIS — R609 Edema, unspecified: Secondary | ICD-10-CM | POA: Diagnosis not present

## 2018-10-26 DIAGNOSIS — R635 Abnormal weight gain: Secondary | ICD-10-CM | POA: Diagnosis not present

## 2018-10-28 DIAGNOSIS — R0601 Orthopnea: Secondary | ICD-10-CM | POA: Diagnosis not present

## 2018-10-28 DIAGNOSIS — I10 Essential (primary) hypertension: Secondary | ICD-10-CM | POA: Diagnosis not present

## 2018-10-28 DIAGNOSIS — R69 Illness, unspecified: Secondary | ICD-10-CM | POA: Diagnosis not present

## 2018-11-03 ENCOUNTER — Telehealth: Payer: Self-pay | Admitting: Neurology

## 2018-11-03 NOTE — Telephone Encounter (Addendum)
Pt wife(on DPR) has called to inform that re: pt's MS he no longer want to do the Ocrevus.  Wife states since off of that he has declined. Wife states pt sleeps about 18 hours a day and is in a wheel chair.  Wife states that he takes perhaps 3 or 4 steps and has to sit/rest.  Wife states she has done research and would like to know if Dr Felecia Shelling would consider Provigil/Modafinil 200mg  for pt.  Please call Kristopher Oppenheim [facility]

## 2018-11-03 NOTE — Telephone Encounter (Signed)
Dr. Sater- please advise 

## 2018-11-08 ENCOUNTER — Other Ambulatory Visit: Payer: Self-pay | Admitting: Neurology

## 2018-11-08 MED ORDER — MODAFINIL 200 MG PO TABS: 200.0000 mg | ORAL_TABLET | Freq: Every day | ORAL | 5 refills | Status: DC

## 2018-11-08 NOTE — Telephone Encounter (Signed)
Pt's wife called wanting to know the update on this. Please advise. Wife states he has "chronic fatigue and anything will help so he can get that little boost."

## 2018-11-08 NOTE — Telephone Encounter (Signed)
Dr. Felecia Shelling- pt wife calling back. What would you recommend?

## 2018-11-08 NOTE — Telephone Encounter (Signed)
Yes, we can do modafinil 200 mg daily.   I will send the prescriptoin in

## 2018-11-08 NOTE — Telephone Encounter (Addendum)
Called and spoke with wife. Advised Dr. Felecia Shelling already sent in rx modafinil to [facility]. Wife is going to use goodrx to get for 23.00 per month. Nothing further needed.

## 2018-11-15 ENCOUNTER — Other Ambulatory Visit: Payer: Self-pay

## 2018-11-15 DIAGNOSIS — G35 Multiple sclerosis: Secondary | ICD-10-CM

## 2018-11-15 MED ORDER — OXYCODONE HCL 5 MG PO TABS: ORAL_TABLET | ORAL | 0 refills | Status: DC

## 2018-11-15 NOTE — Addendum Note (Signed)
Addended by: Hope Pigeon on: 11/15/2018 10:24 AM   Modules accepted: Orders

## 2018-11-15 NOTE — Telephone Encounter (Signed)
Patient called needing a refill on his oxyCODONE (OXY IR/ROXICODONE) 5 MG immediate release tablet. [facility]

## 2018-11-16 ENCOUNTER — Telehealth: Payer: Self-pay | Admitting: Neurology

## 2018-11-16 NOTE — Telephone Encounter (Signed)
Patient calling because when his wife picked [facility] [address], she was told by the pharmacist  that Dr Felecia Shelling wanted to speak to him. Please call him at [phone]

## 2018-11-16 NOTE — Telephone Encounter (Signed)
Called pt back. Advised rx that was just sent in still had old request for him to call and schedule f/u. He was recently seen. He can disregard this. He verbalized understanding.

## 2018-11-28 DIAGNOSIS — Z86010 Personal history of colonic polyps: Secondary | ICD-10-CM | POA: Diagnosis not present

## 2018-11-28 DIAGNOSIS — R7303 Prediabetes: Secondary | ICD-10-CM | POA: Diagnosis not present

## 2018-11-28 DIAGNOSIS — I739 Peripheral vascular disease, unspecified: Secondary | ICD-10-CM | POA: Diagnosis not present

## 2018-11-28 DIAGNOSIS — G35 Multiple sclerosis: Secondary | ICD-10-CM | POA: Diagnosis not present

## 2018-11-28 DIAGNOSIS — I10 Essential (primary) hypertension: Secondary | ICD-10-CM | POA: Diagnosis not present

## 2018-11-28 DIAGNOSIS — R42 Dizziness and giddiness: Secondary | ICD-10-CM | POA: Diagnosis not present

## 2018-11-28 DIAGNOSIS — R69 Illness, unspecified: Secondary | ICD-10-CM | POA: Diagnosis not present

## 2018-12-13 ENCOUNTER — Other Ambulatory Visit: Payer: Self-pay | Admitting: Neurology

## 2018-12-13 DIAGNOSIS — G35 Multiple sclerosis: Secondary | ICD-10-CM

## 2018-12-13 MED ORDER — OXYCODONE HCL 5 MG PO TABS: ORAL_TABLET | ORAL | 0 refills | Status: DC

## 2018-12-13 NOTE — Telephone Encounter (Signed)
Phone rep checked office voicemail; at 10:46a.m. pt left a message asking for a refill on his  oxyCODONE (OXY IR/ROXICODONE) 5 MG immediate release tablet  [id number]

## 2018-12-13 NOTE — Addendum Note (Signed)
Addended by: Hope Pigeon on: 12/13/2018 12:07 PM   Modules accepted: Orders

## 2018-12-15 ENCOUNTER — Other Ambulatory Visit: Payer: Self-pay | Admitting: Neurology

## 2018-12-16 ENCOUNTER — Other Ambulatory Visit: Payer: Self-pay | Admitting: Neurology

## 2019-01-06 DIAGNOSIS — Z86010 Personal history of colonic polyps: Secondary | ICD-10-CM | POA: Diagnosis not present

## 2019-01-12 DIAGNOSIS — R195 Other fecal abnormalities: Secondary | ICD-10-CM | POA: Diagnosis not present

## 2019-01-13 DIAGNOSIS — E782 Mixed hyperlipidemia: Secondary | ICD-10-CM | POA: Diagnosis not present

## 2019-01-13 DIAGNOSIS — M810 Age-related osteoporosis without current pathological fracture: Secondary | ICD-10-CM | POA: Diagnosis not present

## 2019-01-13 DIAGNOSIS — I10 Essential (primary) hypertension: Secondary | ICD-10-CM | POA: Diagnosis not present

## 2019-01-16 ENCOUNTER — Other Ambulatory Visit: Payer: Self-pay | Admitting: Neurology

## 2019-01-16 DIAGNOSIS — G35 Multiple sclerosis: Secondary | ICD-10-CM

## 2019-01-16 MED ORDER — OXYCODONE HCL 5 MG PO TABS: ORAL_TABLET | ORAL | 0 refills | Status: DC

## 2019-01-16 NOTE — Telephone Encounter (Signed)
1) Medication(s) Requested (by name): oxyCODONE (OXY IR/ROXICODONE) 5 MG immediate release tablet   2) [facility] [address]

## 2019-01-16 NOTE — Telephone Encounter (Signed)
[place] drug registry has been verified. Last refill was 12/15/2018 # 120 for a 30 day supply. Pt is up to date on f/u's.

## 2019-02-07 DIAGNOSIS — Z0000 Encounter for general adult medical examination without abnormal findings: Secondary | ICD-10-CM | POA: Diagnosis not present

## 2019-02-07 DIAGNOSIS — E782 Mixed hyperlipidemia: Secondary | ICD-10-CM | POA: Diagnosis not present

## 2019-02-07 DIAGNOSIS — R3915 Urgency of urination: Secondary | ICD-10-CM | POA: Diagnosis not present

## 2019-02-07 DIAGNOSIS — L02224 Furuncle of groin: Secondary | ICD-10-CM | POA: Diagnosis not present

## 2019-02-07 DIAGNOSIS — R7989 Other specified abnormal findings of blood chemistry: Secondary | ICD-10-CM | POA: Diagnosis not present

## 2019-02-07 DIAGNOSIS — I10 Essential (primary) hypertension: Secondary | ICD-10-CM | POA: Diagnosis not present

## 2019-02-07 DIAGNOSIS — R7303 Prediabetes: Secondary | ICD-10-CM | POA: Diagnosis not present

## 2019-02-07 DIAGNOSIS — B372 Candidiasis of skin and nail: Secondary | ICD-10-CM | POA: Diagnosis not present

## 2019-02-07 DIAGNOSIS — I739 Peripheral vascular disease, unspecified: Secondary | ICD-10-CM | POA: Diagnosis not present

## 2019-02-07 DIAGNOSIS — R609 Edema, unspecified: Secondary | ICD-10-CM | POA: Diagnosis not present

## 2019-02-20 ENCOUNTER — Other Ambulatory Visit: Payer: Self-pay | Admitting: Neurology

## 2019-02-20 DIAGNOSIS — G35 Multiple sclerosis: Secondary | ICD-10-CM

## 2019-02-20 MED ORDER — OXYCODONE HCL 5 MG PO TABS: ORAL_TABLET | ORAL | 0 refills | Status: DC

## 2019-02-20 NOTE — Telephone Encounter (Signed)
Pt is requesting a refill of oxyCODONE (OXY IR/ROXICODONE) 5 MG immediate release tablet, [id number] [place], [place] [address] [place]. AT [address]

## 2019-02-20 NOTE — Telephone Encounter (Signed)
Checked drug registry. He last refilled oxycodone 01/16/2019 #120. Last seen 09/28/18 and next f/u 04/03/19

## 2019-03-20 ENCOUNTER — Other Ambulatory Visit: Payer: Self-pay

## 2019-03-20 DIAGNOSIS — G35 Multiple sclerosis: Secondary | ICD-10-CM

## 2019-03-20 MED ORDER — OXYCODONE HCL 5 MG PO TABS: ORAL_TABLET | ORAL | 0 refills | Status: DC

## 2019-03-20 NOTE — Telephone Encounter (Signed)
1) Medication(s) Requested (by name): oxyCODONE (OXY IR/ROXICODONE) 5 MG immediate release tablet   2) [id number] [place], [place] [address]. AT [place]  [address], [place] [address]

## 2019-04-03 ENCOUNTER — Ambulatory Visit: Payer: Medicare HMO | Admitting: Neurology

## 2019-04-03 ENCOUNTER — Other Ambulatory Visit: Payer: Self-pay

## 2019-04-03 ENCOUNTER — Encounter: Payer: Self-pay | Admitting: Neurology

## 2019-04-03 VITALS — BP 117/74 | HR 78 | Temp 97.4°F | Ht 69.0 in | Wt 215.5 lb

## 2019-04-03 DIAGNOSIS — R269 Unspecified abnormalities of gait and mobility: Secondary | ICD-10-CM | POA: Diagnosis not present

## 2019-04-03 DIAGNOSIS — R3915 Urgency of urination: Secondary | ICD-10-CM | POA: Diagnosis not present

## 2019-04-03 DIAGNOSIS — D751 Secondary polycythemia: Secondary | ICD-10-CM | POA: Diagnosis not present

## 2019-04-03 DIAGNOSIS — G35 Multiple sclerosis: Secondary | ICD-10-CM

## 2019-04-03 DIAGNOSIS — G894 Chronic pain syndrome: Secondary | ICD-10-CM

## 2019-04-03 MED ORDER — BACLOFEN 10 MG PO TABS: ORAL_TABLET | ORAL | 3 refills | Status: DC

## 2019-04-03 MED ORDER — TAMSULOSIN HCL 0.4 MG PO CAPS: 0.4000 mg | ORAL_CAPSULE | Freq: Every day | ORAL | 3 refills | Status: DC

## 2019-04-03 MED ORDER — OXYCODONE HCL 5 MG PO TABS: ORAL_TABLET | ORAL | 0 refills | Status: DC

## 2019-04-03 MED ORDER — DALFAMPRIDINE ER 10 MG PO TB12: 10.0000 mg | ORAL_TABLET | Freq: Two times a day (BID) | ORAL | 3 refills | Status: AC

## 2019-04-03 NOTE — Progress Notes (Signed)
[facility]  PATIENT: Vincent Evans DOB: Oct 30, 1950  REFERRING CLINICIAN: Melinda Crutch HISTORY FROM: Patient  REASON FOR VISIT: Multiple Sclerosis   HISTORICAL  CHIEF COMPLAINT:  Chief Complaint  Patient presents with  . Follow-up    RM 12 with spouse. Last seen 09/28/18  . Multiple Sclerosis    Off DMT. In [facility] in office today.   . Pain contract    Pt signed new pain contract to fill rx oxycodone at [facility] instead of [facility] moving forward. Sent copy to be scanned to epic.    HISTORY OF PRESENT ILLNESS:  Vincent Evans is a 69 y.o. man with multiple sclerosis.   He is noting that balance and leg strength are worse.    Update 04/03/2019: He is noting a gradual decline with worsening leg strength and more difficulty bearing weight and transfer.   He has muscle spasms, right > left, and gets more spasms at night.  He is on dalfampridine and has a benefit with strength..   He is on baclofen 10 mg po 12-03-18 (4/day) with benefit and he tolerate it well.  He has been on baclofen for about 6 or 7 years.  He denies any numbness or dysesthesias.  He has urinary frequency and hesitancy.  Tamsulosin has helped some.  He notes fatigue.  He sleeps well most nights.  Cognition is doing fairly well.Marland Kitchen       He continues to have pain and does better with oxycodone.  Pain fluctuates a lot.   Additionally he has pedal edema both legs and wears compression hose with benefit.  Update 09/28/2018: He is on Ocrevus for PPMS.   He tolerates it well.   His right leg has been doing worse over the last year, especially the last 6 months.  He can only go a few steps with a walker.  He is also taking prednisone 20 mg.   We had intended that he takes it as a taper but he has been taking one a day.  He feels better on prednisone.     He has more bladder issues. He has urge incontinence and nocturia.    He notes more straining.   He takes probiotics and fiber without much benefit.    He goes 1-2 times a  day.    He is noting a lot more hiccups  He is noting a lot more edema in his legs.   He has trouble getting the compression stocking on.   He has a machine to help apply pressure.   He gets muscle cramps in his legs but there is not a lot of pain or any redness/heat.  He is on HCTZ 25 mg mixed with lisinopril .  We discussed Lasix may help the   Update 03/24/2018: He fell 03/08/2018 and was feeling much weaker and he fell.  His wife had trouble getting him up off the floor.  Additionally the wife notes that his urine was much darker.  He went to the ED 03/08/2018 due to the weakness.  He does not think he hit his head.Marland Kitchen  He was admitted and received 3 days of IV SoluMedrol.  MRI of the cervical spine showed multiple lesions.  MRI of the brain also showed multiple T2/flair hyperintense foci, with possible progression in the pons but similar pattern elsewhere.   At the time he was having fevers (> 100 for one reading and 99.4 during the hospital.   He had influenza A by PCR.  I personally reviewed the MRI of the brain and cervical spine performed 1/14/202020.  I compared the brain MRI to the 03/25/2015 MRI.  The brain shows multiple T2/flair hyperintense foci in the deep and periventricular white matter, pons and right middle cerebellar peduncle.  There are also some changes in the right cingulate region more likely to be ischemic.  For the most part, the MRI looks unchanged compared to the 2017 MRI though there may be just a little more hyperintense signal within the pons.  This could be demyelinating or ischemic or metabolic.  Atrophy is also noted.  The MRI of the cervical spine shows multiple T2 hyperintense foci in a pattern consistent with MS.  Films were not available for comparison.  Update 01/27/2018: He is on Ocrevus.  He tolerates it well.  The last infusion (the third 1) was September 2019.  He has noted some continued worsening of his neurologic function.  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  Ocrevus is the only FDA approved medication for PMS.    He did try biotin and alpha lipoic acid but had trouble tolerating both of them due to GI distress.  He feels the right leg has caught up to the weakness of the left leg.  Gait is more difficulty.  With a cane and furniture/walls, he can go about 30 feet without stopping.  He will use a wheelchair when outside..  He has Ampyra (actually compounded for aminopyridine)   .  He is not sure how much it is helping but he did notice improvement when he started on it.    He has some urinary symptoms that are stable.  Vision is fine.  He denies any difficulty with cognition.  He notes mild depression but not bad enough to take medication for.  He sleeps well most nights.  Update 07/27/2017: He is on Ocrevus and he tolerates it well.  Last infusion was in March; this was the second dose.Marland Kitchen    He feels weaker, especially over the last week.   He hasn't been able to use the cane and even with a walker he is doing worse.     Both legs are weak, left worse than right.    He was doing PT in the past at [facility] and he feels it helped some in the past.     On April 3, he fell and landed on his knees and arms.    Ribs were bruised but no fractures.   He feels he has done worse with his gait since the fall.    He is on 4-aminopyridine (compounded).  He tolerates it well and feels it  have been helping his gait some.  He has urinary urgency and some urge incontinence.   He has occasional constipation.   patient is fine.  He notes some frustration and irritability at times.  He has tried to quit smoking but with more issues from his MS lately, he went back.  He denies any difficulty with cognition.  Update 01/26/2017:     He had his first split dose of Ocrevus in September.   He tolerated it well and there were no infusion reactions.     He feels he has some days where he is doing a little better with  gait/balance.      He also feel his gait may be mildly better on compounded Ampyra.  He use a cane for short  distance and his walker for longer.    He has constipation (averaging going every other day).   Bladder function is ok during the day but he has 2 x nocturia.   He feels tired every afternoon.     He snores but has not been noted to   He cut his drinking down.   He is using a Vape instead of cigarettes to an extent (still smokes 15 cigarettes a day).  From 08/31/2016: MS:   Currently, he is on Tecfidera. He tolerates it well but has continued to worsen over the last couple of years. We discussed that he has had a more progressive course, really since the onset of his disease, and primary progressive MS is more likely than a relapsing remitting MS.      Gait/strength/sensation:   He has poor balance and left > right weakness and spasticity.   He stumbles but no falls.     He now uses a walker for longer distance outside his home but the cane most of the time.    Ampyra seemed to stop helping so he stopped.   He has spasticity, worse first thing in the morning     He denies numbness in the legs.     Exercise:  He belongs to a gym ([facility]) and they participate in a Silver Sneakers program.  We discussed that therapy and exercises  may be beneficial and I recommend that he considers doing more.     Pain:   He continues to have pain in the right buttock region and leg more than the left    Oxycodone and baclofen helps the pain.     He notes his pain in the legs is worse when he lays down at night, especially down the right leg. He notes more spasticity at night also.    He does not have side effects with the baclofen.  Bladder:   He reports bladder urgency and frequency and has had some incontinence. Oxybutynin and Detrol have only helped mildly.  He has 2 x nocturia most nights.    Vision:  He notes no MS related vision problems.  No diplopia or major acuity issues.   He has readers and other glasses  for driving.     Fatigue/Sleep:   He reports fatigue that is physical and cognitive. He does try to stay active but notes that walking is more difficult so he is less active than he was last year.       He falls sleeps fairly well most nights.  He has 2 x nocturia but quickly falls asleep.  He naps most days and feels much better afterwards.   He snores at night. He does have some snorting but no definite pauses in his breathing.    He does not want to get a PSG and would not wear CPAP or an oral appliance.    Mood/cognition   he feels he is doing well with mood and denies any depression. However, he sometimes has some apathy. There is no anxiety.    He feels his cognition is doing well and he does activities on the computer without difficulty.       Other:   He is unable to work. 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   MS History:  Around 2013, he  presented with left leg weakness and gait spasticity and clumsiness. He had MRIs of  the brain and spine. Findings were consistent with multiple sclerosis. He was referred to me and I placed him on Tecfidera.  Initially, he had GI symptoms and diarrhea from Tecfidera but this is much better now.    He gets frequent CBC's due to his hematologic disorder and lymphocyte count is usually about 1.0.     REVIEW OF SYSTEMS:  Constitutional: No fevers, chills, sweats, or change in appetite.   He snores and has some insomnia.  He has heat intolerance Eyes: He has notes some visual blurring.  Eyes itch.    He denies double vision, eye pain Ear, nose and throat: No hearing loss, ear pain, nasal congestion, sore throat Cardiovascular: No chest pain, palpitations Respiratory:  He notes cough and occ  SOB GastrointestinaI: No nausea, vomiting, diarrhea, abdominal pain, fecal incontinence Genitourinary:  as above Musculoskeletal:  No neck pain, some back pain Integumentary: No rash,  pruritus, skin lesions Neurological: as above.  And has noted RLS Psychiatric: He notes mild depression at this time.  No anxiety Endocrine: No palpitations, diaphoresis, change in appetite, change in weight or increased thirst Hematologic/Lymphatic:  No anemia, purpura, petechiae. Allergic/Immunologic: No itchy/runny eyes, nasal congestion, recent allergic reactions, rashes  ALLERGIES: No Known Allergies  HOME MEDICATIONS: Outpatient Medications Prior to Visit  Medication Sig Dispense Refill  . atorvastatin (LIPITOR) 10 MG tablet Take 10 mg by mouth daily.    . Cholecalciferol (VITAMIN D3) 125 MCG (5000 UT) CAPS Take 5,000 Units by mouth every evening.    . cilostazol (PLETAL) 100 MG tablet Take 100 mg by mouth 2 (two) times daily.     . Cyanocobalamin (VITAMIN B-12) 5000 MCG TBDP Take 5,000 Units by mouth daily.    Marland Kitchen lisinopril-hydrochlorothiazide (PRINZIDE,ZESTORETIC) 20-25 MG tablet Take 1 tablet by mouth daily.  4  . modafinil (PROVIGIL) 200 MG tablet Take 1 tablet (200 mg total) by mouth daily. 30 tablet 5  . Multiple Vitamins-Minerals (AIRBORNE PO) Take 1 Dose by mouth daily.    Marland Kitchen QUERCETIN PO Take 2,000 mg by mouth daily.    Marland Kitchen spironolactone (ALDACTONE) 100 MG tablet Take 100 mg by mouth daily.    Marland Kitchen VITAMIN E PO Take 162 mg by mouth daily.    . baclofen (LIORESAL) 10 MG tablet TAKE 1 TABLET BY MOUTH IN THE MORNING AND 1 IN THE EVENING AND 2 AT BEDTIME 360 tablet 3  . dalfampridine 10 MG TB12 Take 1 tablet (10 mg total) by mouth every 12 (twelve) hours. 60 tablet 11  . oxyCODONE (OXY IR/ROXICODONE) 5 MG immediate release tablet Take 1 tablet every 6 hours as needed for severe pain. 120 tablet 0  . tamsulosin (FLOMAX) 0.4 MG CAPS capsule Take 1 capsule (0.4 mg total) by mouth daily. 30 capsule 11  . aspirin 81 MG tablet Take 81 mg by mouth daily.    Marland Kitchen ocrelizumab 600 mg in sodium chloride 0.9 % 500 mL Inject 600 mg into the vein every 6 (six) months.    . predniSONE (DELTASONE) 5  MG tablet Take as directed 100 tablet 1   No facility-administered medications prior to visit.    PAST MEDICAL HISTORY: Past Medical History:  Diagnosis Date  . Anxiety 12/22/2013  . Hearing loss   . Movement disorder   . Multiple sclerosis ([place])   . Neuromuscular disorder ([place])    MS  . 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 Vision abnormalities     PAST SURGICAL HISTORY: Past Surgical History:  Procedure Laterality Date  .  BUNIONECTOMY      FAMILY HISTORY: Family History  Problem Relation Age of Onset  . Dementia Mother   . Diabetes type II Father   . Hypertension Father   . Cancer Maternal Grandmother        pancreatic ca    SOCIAL HISTORY:  Social History   Socioeconomic History  . Marital status: Married    Spouse name: Not on file  . Number of children: Not on file  . Years of education: Not on file  . Highest education level: Not on file  Occupational History  . Not on file  Tobacco Use  . Smoking status: Current Every Day Smoker    Packs/day: 0.75    Years: 35.00    Pack years: 26.25    Types: Cigarettes  . Smokeless tobacco: Never Used  Substance and Sexual Activity  . Alcohol use: Yes    Alcohol/week: 0.0 standard drinks    Comment: 3 to 6 oz. of Bourbon daily, and an occasional beer./fim  . Drug use: No  . Sexual activity: Not on file  Other Topics Concern  . Not on file  Social History Narrative  . Not on file   Social Determinants of Health   Financial Resource Strain:   . Difficulty of Paying Living Expenses: Not on file  Food Insecurity:   . Worried About Charity fundraiser in the Last Year: Not on file  . Ran Out of Food in the Last Year: Not on file  Transportation Needs:   . Lack of Transportation (Medical): Not on file  . Lack of Transportation (Non-Medical): Not on file  Physical Activity:   . Days of Exercise per Week: Not on file  . Minutes of Exercise per Session: Not on file  Stress:   . Feeling of Stress : Not on  file  Social Connections:   . Frequency of Communication with Friends and Family: Not on file  . Frequency of Social Gatherings with Friends and Family: Not on file  . Attends Religious Services: Not on file  . Active Member of Clubs or Organizations: Not on file  . Attends Archivist Meetings: Not on file  . Marital Status: Not on file  Intimate Partner Violence:   . Fear of Current or Ex-Partner: Not on file  . Emotionally Abused: Not on file  . Physically Abused: Not on file  . Sexually Abused: Not on file     PHYSICAL EXAM  Vitals:   04/03/19 1110  BP: 117/74  Pulse: 78  Temp: (!) 97.4 F (36.3 C)  Weight: 215 lb 8 oz (97.8 kg)  Height: 5\' 9"  (1.753 m)    Body mass index is 31.82 kg/m.   General: The patient is well-developed and well-nourished and in no acute distress.  He has bilateral pedal edema, right greater than left. Neurologic Exam  Mental status: The patient is alert and oriented x 3 at the time of the examination. The patient has apparent normal recent and remote memory, with an apparently normal attention span and concentration ability.   Speech is normal.  Cranial nerves: Extraocular movements are full.  Facial strength is normal.  Trapezius strength is normal.  The tongue is midline, and the patient has symmetric elevation of the soft palate. No obvious hearing deficits are noted.  Motor:  Muscle bulk is normal.  Muscle tone is increased in the legs, left a little more than the right.  He has normal strength in the arms.  Strength is 2 prox and 2+/5 elesewhere in the right leg and 2/5 hip flexion and 2+/5 elsewhere in the left leg.    Sensory: He has intact sensation to touch and vibration in the legs.  Coordination: Cerebellar testing reveals good finger-nose-finger bilaterally.  He cannot do heel-to-shin.  Gait and station: He can bear some weight but cannot stand without strong bilateral support.  He is unable to walk.  Reflexes: Deep  tendon reflexes are increased, left greater than right.    DIAGNOSTIC DATA (LABS, IMAGING, TESTING) - I reviewed patient records, labs, notes, testing and imaging myself where available.  Lab Results  Component Value Date   WBC 15.4 (H) 09/28/2018   HGB 14.4 09/28/2018   HCT 39.4 09/28/2018   MCV 97 09/28/2018   PLT 273 09/28/2018      ASSESSMENT AND PLAN    1. Multiple sclerosis ([place])   2. Secondary erythrocytosis   3. Gait disturbance   4. Urinary urgency   5. Chronic pain syndrome     1.  Continue off Ocrevus.   No need for labwork today.    2.  Continue 4-AP for gait  3.  Continue compression stockings for edema.      4.  Stay active and exercise as tolerated.     5.  Continue tamsulosin for retention.    6.   RTC 6  months or call sooner if he has new or worsening neurologic symptoms.    Lanissa Cashen A. Felecia Shelling, MD, PhD [id number], [id number] AM Certified in Neurology, Clinical Neurophysiology, Sleep Medicine, Pain Medicine and Neuroimaging  [facility] [address], [place], [place] [address] [phone]

## 2019-04-04 ENCOUNTER — Telehealth: Payer: Self-pay | Admitting: *Deleted

## 2019-04-04 NOTE — Telephone Encounter (Signed)
Submitted PA dalfampridine on CMM. [id number]. Received instant approval. Effective 02/24/2019-02/23/2020. Faxed notice of approval to [facility] at [phone]. Received fax confirmation.

## 2019-05-23 ENCOUNTER — Other Ambulatory Visit: Payer: Self-pay | Admitting: Neurology

## 2019-05-23 DIAGNOSIS — G35 Multiple sclerosis: Secondary | ICD-10-CM

## 2019-05-23 MED ORDER — OXYCODONE HCL 5 MG PO TABS: ORAL_TABLET | ORAL | 0 refills | Status: DC

## 2019-05-23 NOTE — Telephone Encounter (Signed)
1) Medication(s) Requested (by name): oxyCODONE (OXY IR/ROXICODONE) 5 MG immediate release tablet   2) Pharmacy of Choice:   [facility] [place], [place]  [address] [place] [address]

## 2019-06-07 ENCOUNTER — Telehealth: Payer: Self-pay | Admitting: Neurology

## 2019-06-07 DIAGNOSIS — R269 Unspecified abnormalities of gait and mobility: Secondary | ICD-10-CM

## 2019-06-07 DIAGNOSIS — G35 Multiple sclerosis: Secondary | ICD-10-CM

## 2019-06-07 NOTE — Telephone Encounter (Signed)
Pt has called to ask if Dr Felecia Shelling will place an order for  in home physical rehab for him.  Please call pt to discuss.

## 2019-06-07 NOTE — Addendum Note (Signed)
Addended by: Wyvonnia Lora on: 06/07/2019 11:17 AM   Modules accepted: Orders

## 2019-06-07 NOTE — Telephone Encounter (Signed)
Called pt. He would like in home PT/has [employer]/Medicare and was told most of the service would be covered. Advised we will pair with nursing, they require two services to go out. He verbalized understanding. He does not have preference of company to be referred to. Would like to be called once referral sent on who he is referred to.

## 2019-06-08 NOTE — Telephone Encounter (Signed)
Called patient and made him aware that [facility] had accepted him and that they would not be able to start until the week of 19 th . Patient was fine with that .

## 2019-06-18 DIAGNOSIS — D751 Secondary polycythemia: Secondary | ICD-10-CM | POA: Diagnosis not present

## 2019-06-18 DIAGNOSIS — M21372 Foot drop, left foot: Secondary | ICD-10-CM | POA: Diagnosis not present

## 2019-06-18 DIAGNOSIS — I10 Essential (primary) hypertension: Secondary | ICD-10-CM | POA: Diagnosis not present

## 2019-06-18 DIAGNOSIS — G35 Multiple sclerosis: Secondary | ICD-10-CM | POA: Diagnosis not present

## 2019-06-18 DIAGNOSIS — M810 Age-related osteoporosis without current pathological fracture: Secondary | ICD-10-CM | POA: Diagnosis not present

## 2019-06-18 DIAGNOSIS — N3941 Urge incontinence: Secondary | ICD-10-CM | POA: Diagnosis not present

## 2019-06-18 DIAGNOSIS — R69 Illness, unspecified: Secondary | ICD-10-CM | POA: Diagnosis not present

## 2019-06-18 DIAGNOSIS — G894 Chronic pain syndrome: Secondary | ICD-10-CM | POA: Diagnosis not present

## 2019-06-18 DIAGNOSIS — K5900 Constipation, unspecified: Secondary | ICD-10-CM | POA: Diagnosis not present

## 2019-06-18 DIAGNOSIS — M5431 Sciatica, right side: Secondary | ICD-10-CM | POA: Diagnosis not present

## 2019-06-19 DIAGNOSIS — R69 Illness, unspecified: Secondary | ICD-10-CM | POA: Diagnosis not present

## 2019-06-19 DIAGNOSIS — M5431 Sciatica, right side: Secondary | ICD-10-CM | POA: Diagnosis not present

## 2019-06-19 DIAGNOSIS — N3941 Urge incontinence: Secondary | ICD-10-CM | POA: Diagnosis not present

## 2019-06-19 DIAGNOSIS — K5900 Constipation, unspecified: Secondary | ICD-10-CM | POA: Diagnosis not present

## 2019-06-19 DIAGNOSIS — G35 Multiple sclerosis: Secondary | ICD-10-CM | POA: Diagnosis not present

## 2019-06-19 DIAGNOSIS — D751 Secondary polycythemia: Secondary | ICD-10-CM | POA: Diagnosis not present

## 2019-06-19 DIAGNOSIS — I10 Essential (primary) hypertension: Secondary | ICD-10-CM | POA: Diagnosis not present

## 2019-06-19 DIAGNOSIS — G894 Chronic pain syndrome: Secondary | ICD-10-CM | POA: Diagnosis not present

## 2019-06-19 DIAGNOSIS — M810 Age-related osteoporosis without current pathological fracture: Secondary | ICD-10-CM | POA: Diagnosis not present

## 2019-06-19 DIAGNOSIS — M21372 Foot drop, left foot: Secondary | ICD-10-CM | POA: Diagnosis not present

## 2019-06-19 NOTE — Telephone Encounter (Signed)
Tillie Rung with in home PT requesting VO to see the pt twice a week for 4 weeks please contact at [phone]

## 2019-06-19 NOTE — Telephone Encounter (Signed)
Called and provided VO for PT twice weekly for 4 weeks per Dr. Felecia Shelling. She verbalized understanding, nothing further needed.

## 2019-06-22 ENCOUNTER — Other Ambulatory Visit: Payer: Self-pay | Admitting: Neurology

## 2019-06-22 DIAGNOSIS — I10 Essential (primary) hypertension: Secondary | ICD-10-CM | POA: Diagnosis not present

## 2019-06-22 DIAGNOSIS — E782 Mixed hyperlipidemia: Secondary | ICD-10-CM | POA: Diagnosis not present

## 2019-06-22 DIAGNOSIS — N3941 Urge incontinence: Secondary | ICD-10-CM | POA: Diagnosis not present

## 2019-06-22 DIAGNOSIS — M5431 Sciatica, right side: Secondary | ICD-10-CM | POA: Diagnosis not present

## 2019-06-22 DIAGNOSIS — G35 Multiple sclerosis: Secondary | ICD-10-CM | POA: Diagnosis not present

## 2019-06-22 DIAGNOSIS — D751 Secondary polycythemia: Secondary | ICD-10-CM | POA: Diagnosis not present

## 2019-06-22 DIAGNOSIS — M810 Age-related osteoporosis without current pathological fracture: Secondary | ICD-10-CM | POA: Diagnosis not present

## 2019-06-22 DIAGNOSIS — R69 Illness, unspecified: Secondary | ICD-10-CM | POA: Diagnosis not present

## 2019-06-22 DIAGNOSIS — G894 Chronic pain syndrome: Secondary | ICD-10-CM | POA: Diagnosis not present

## 2019-06-22 DIAGNOSIS — K5900 Constipation, unspecified: Secondary | ICD-10-CM | POA: Diagnosis not present

## 2019-06-22 DIAGNOSIS — M21372 Foot drop, left foot: Secondary | ICD-10-CM | POA: Diagnosis not present

## 2019-06-22 MED ORDER — OXYCODONE HCL 5 MG PO TABS: ORAL_TABLET | ORAL | 0 refills | Status: DC

## 2019-06-22 NOTE — Addendum Note (Signed)
Addended by: Wyvonnia Lora on: 06/22/2019 01:47 PM   Modules accepted: Orders

## 2019-06-22 NOTE — Telephone Encounter (Signed)
Pt is needing a refill on his oxyCODONE (OXY IR/ROXICODONE) 5 MG immediate release tablet sent in to the [facility] on [address]. 

## 2019-06-26 DIAGNOSIS — M810 Age-related osteoporosis without current pathological fracture: Secondary | ICD-10-CM | POA: Diagnosis not present

## 2019-06-26 DIAGNOSIS — I10 Essential (primary) hypertension: Secondary | ICD-10-CM | POA: Diagnosis not present

## 2019-06-26 DIAGNOSIS — R69 Illness, unspecified: Secondary | ICD-10-CM | POA: Diagnosis not present

## 2019-06-26 DIAGNOSIS — M5431 Sciatica, right side: Secondary | ICD-10-CM | POA: Diagnosis not present

## 2019-06-26 DIAGNOSIS — N3941 Urge incontinence: Secondary | ICD-10-CM | POA: Diagnosis not present

## 2019-06-26 DIAGNOSIS — K5900 Constipation, unspecified: Secondary | ICD-10-CM | POA: Diagnosis not present

## 2019-06-26 DIAGNOSIS — G35 Multiple sclerosis: Secondary | ICD-10-CM | POA: Diagnosis not present

## 2019-06-26 DIAGNOSIS — D751 Secondary polycythemia: Secondary | ICD-10-CM | POA: Diagnosis not present

## 2019-06-26 DIAGNOSIS — M21372 Foot drop, left foot: Secondary | ICD-10-CM | POA: Diagnosis not present

## 2019-06-26 DIAGNOSIS — G894 Chronic pain syndrome: Secondary | ICD-10-CM | POA: Diagnosis not present

## 2019-06-29 DIAGNOSIS — M810 Age-related osteoporosis without current pathological fracture: Secondary | ICD-10-CM | POA: Diagnosis not present

## 2019-06-29 DIAGNOSIS — M21372 Foot drop, left foot: Secondary | ICD-10-CM | POA: Diagnosis not present

## 2019-06-29 DIAGNOSIS — G35 Multiple sclerosis: Secondary | ICD-10-CM | POA: Diagnosis not present

## 2019-06-29 DIAGNOSIS — K5900 Constipation, unspecified: Secondary | ICD-10-CM | POA: Diagnosis not present

## 2019-06-29 DIAGNOSIS — R69 Illness, unspecified: Secondary | ICD-10-CM | POA: Diagnosis not present

## 2019-06-29 DIAGNOSIS — D751 Secondary polycythemia: Secondary | ICD-10-CM | POA: Diagnosis not present

## 2019-06-29 DIAGNOSIS — N3941 Urge incontinence: Secondary | ICD-10-CM | POA: Diagnosis not present

## 2019-06-29 DIAGNOSIS — I10 Essential (primary) hypertension: Secondary | ICD-10-CM | POA: Diagnosis not present

## 2019-06-29 DIAGNOSIS — G894 Chronic pain syndrome: Secondary | ICD-10-CM | POA: Diagnosis not present

## 2019-06-29 DIAGNOSIS — M5431 Sciatica, right side: Secondary | ICD-10-CM | POA: Diagnosis not present

## 2019-07-01 ENCOUNTER — Other Ambulatory Visit: Payer: Self-pay | Admitting: Neurology

## 2019-07-03 DIAGNOSIS — G35 Multiple sclerosis: Secondary | ICD-10-CM | POA: Diagnosis not present

## 2019-07-03 DIAGNOSIS — M21372 Foot drop, left foot: Secondary | ICD-10-CM | POA: Diagnosis not present

## 2019-07-03 DIAGNOSIS — I10 Essential (primary) hypertension: Secondary | ICD-10-CM | POA: Diagnosis not present

## 2019-07-03 DIAGNOSIS — K5900 Constipation, unspecified: Secondary | ICD-10-CM | POA: Diagnosis not present

## 2019-07-03 DIAGNOSIS — N3941 Urge incontinence: Secondary | ICD-10-CM | POA: Diagnosis not present

## 2019-07-03 DIAGNOSIS — R69 Illness, unspecified: Secondary | ICD-10-CM | POA: Diagnosis not present

## 2019-07-03 DIAGNOSIS — M5431 Sciatica, right side: Secondary | ICD-10-CM | POA: Diagnosis not present

## 2019-07-03 DIAGNOSIS — M810 Age-related osteoporosis without current pathological fracture: Secondary | ICD-10-CM | POA: Diagnosis not present

## 2019-07-03 DIAGNOSIS — D751 Secondary polycythemia: Secondary | ICD-10-CM | POA: Diagnosis not present

## 2019-07-03 DIAGNOSIS — G894 Chronic pain syndrome: Secondary | ICD-10-CM | POA: Diagnosis not present

## 2019-07-05 ENCOUNTER — Telehealth: Payer: Self-pay | Admitting: Neurology

## 2019-07-05 NOTE — Telephone Encounter (Signed)
Rep with [facility] called to confirm if plan of care orders were received for dr.saters review faxed over on 4/29  Fax#[phone]

## 2019-07-05 NOTE — Telephone Encounter (Signed)
Faxed back completed/signed orders to [phone]. Received fax confirmation.

## 2019-07-05 NOTE — Telephone Encounter (Signed)
Called back and LVM advising we did not receive this and asked that it be re-faxed to [place] at [phone] attn  Dr. Felecia Shelling.

## 2019-07-05 NOTE — Telephone Encounter (Signed)
Received orders by fax. Waiting on MD signature and then will fax back

## 2019-07-06 DIAGNOSIS — R69 Illness, unspecified: Secondary | ICD-10-CM | POA: Diagnosis not present

## 2019-07-06 DIAGNOSIS — N3941 Urge incontinence: Secondary | ICD-10-CM | POA: Diagnosis not present

## 2019-07-06 DIAGNOSIS — M810 Age-related osteoporosis without current pathological fracture: Secondary | ICD-10-CM | POA: Diagnosis not present

## 2019-07-06 DIAGNOSIS — D751 Secondary polycythemia: Secondary | ICD-10-CM | POA: Diagnosis not present

## 2019-07-06 DIAGNOSIS — G35 Multiple sclerosis: Secondary | ICD-10-CM | POA: Diagnosis not present

## 2019-07-06 DIAGNOSIS — M21372 Foot drop, left foot: Secondary | ICD-10-CM | POA: Diagnosis not present

## 2019-07-06 DIAGNOSIS — G894 Chronic pain syndrome: Secondary | ICD-10-CM | POA: Diagnosis not present

## 2019-07-06 DIAGNOSIS — M5431 Sciatica, right side: Secondary | ICD-10-CM | POA: Diagnosis not present

## 2019-07-06 DIAGNOSIS — I10 Essential (primary) hypertension: Secondary | ICD-10-CM | POA: Diagnosis not present

## 2019-07-06 DIAGNOSIS — K5900 Constipation, unspecified: Secondary | ICD-10-CM | POA: Diagnosis not present

## 2019-07-10 DIAGNOSIS — M21372 Foot drop, left foot: Secondary | ICD-10-CM | POA: Diagnosis not present

## 2019-07-10 DIAGNOSIS — I10 Essential (primary) hypertension: Secondary | ICD-10-CM | POA: Diagnosis not present

## 2019-07-10 DIAGNOSIS — R69 Illness, unspecified: Secondary | ICD-10-CM | POA: Diagnosis not present

## 2019-07-10 DIAGNOSIS — G35 Multiple sclerosis: Secondary | ICD-10-CM | POA: Diagnosis not present

## 2019-07-10 DIAGNOSIS — M5431 Sciatica, right side: Secondary | ICD-10-CM | POA: Diagnosis not present

## 2019-07-10 DIAGNOSIS — D751 Secondary polycythemia: Secondary | ICD-10-CM | POA: Diagnosis not present

## 2019-07-10 DIAGNOSIS — N3941 Urge incontinence: Secondary | ICD-10-CM | POA: Diagnosis not present

## 2019-07-10 DIAGNOSIS — K5900 Constipation, unspecified: Secondary | ICD-10-CM | POA: Diagnosis not present

## 2019-07-10 DIAGNOSIS — M810 Age-related osteoporosis without current pathological fracture: Secondary | ICD-10-CM | POA: Diagnosis not present

## 2019-07-10 DIAGNOSIS — G894 Chronic pain syndrome: Secondary | ICD-10-CM | POA: Diagnosis not present

## 2019-07-13 DIAGNOSIS — M5431 Sciatica, right side: Secondary | ICD-10-CM | POA: Diagnosis not present

## 2019-07-13 DIAGNOSIS — G35 Multiple sclerosis: Secondary | ICD-10-CM | POA: Diagnosis not present

## 2019-07-13 DIAGNOSIS — G894 Chronic pain syndrome: Secondary | ICD-10-CM | POA: Diagnosis not present

## 2019-07-13 DIAGNOSIS — D751 Secondary polycythemia: Secondary | ICD-10-CM | POA: Diagnosis not present

## 2019-07-13 DIAGNOSIS — K5900 Constipation, unspecified: Secondary | ICD-10-CM | POA: Diagnosis not present

## 2019-07-13 DIAGNOSIS — R69 Illness, unspecified: Secondary | ICD-10-CM | POA: Diagnosis not present

## 2019-07-13 DIAGNOSIS — M810 Age-related osteoporosis without current pathological fracture: Secondary | ICD-10-CM | POA: Diagnosis not present

## 2019-07-13 DIAGNOSIS — I10 Essential (primary) hypertension: Secondary | ICD-10-CM | POA: Diagnosis not present

## 2019-07-13 DIAGNOSIS — M21372 Foot drop, left foot: Secondary | ICD-10-CM | POA: Diagnosis not present

## 2019-07-13 DIAGNOSIS — N3941 Urge incontinence: Secondary | ICD-10-CM | POA: Diagnosis not present

## 2019-07-14 DIAGNOSIS — M810 Age-related osteoporosis without current pathological fracture: Secondary | ICD-10-CM | POA: Diagnosis not present

## 2019-07-14 DIAGNOSIS — I10 Essential (primary) hypertension: Secondary | ICD-10-CM | POA: Diagnosis not present

## 2019-07-14 DIAGNOSIS — E782 Mixed hyperlipidemia: Secondary | ICD-10-CM | POA: Diagnosis not present

## 2019-07-25 ENCOUNTER — Other Ambulatory Visit: Payer: Self-pay | Admitting: Neurology

## 2019-07-25 DIAGNOSIS — G35 Multiple sclerosis: Secondary | ICD-10-CM

## 2019-07-25 MED ORDER — OXYCODONE HCL 5 MG PO TABS: ORAL_TABLET | ORAL | 0 refills | Status: DC

## 2019-07-25 NOTE — Telephone Encounter (Signed)
Pt has called for a refill on his  oxyCODONE (OXY IR/ROXICODONE) 5 MG immediate release tablet to  [place]

## 2019-07-25 NOTE — Addendum Note (Signed)
Addended by: Wyvonnia Lora on: 07/25/2019 11:35 AM   Modules accepted: Orders

## 2019-08-03 ENCOUNTER — Telehealth (INDEPENDENT_AMBULATORY_CARE_PROVIDER_SITE_OTHER): Payer: Medicare HMO | Admitting: Neurology

## 2019-08-03 DIAGNOSIS — R269 Unspecified abnormalities of gait and mobility: Secondary | ICD-10-CM | POA: Diagnosis not present

## 2019-08-03 DIAGNOSIS — Z79899 Other long term (current) drug therapy: Secondary | ICD-10-CM | POA: Diagnosis not present

## 2019-08-03 DIAGNOSIS — G35 Multiple sclerosis: Secondary | ICD-10-CM | POA: Diagnosis not present

## 2019-08-03 DIAGNOSIS — R3915 Urgency of urination: Secondary | ICD-10-CM

## 2019-08-03 DIAGNOSIS — G894 Chronic pain syndrome: Secondary | ICD-10-CM

## 2019-08-03 NOTE — Progress Notes (Signed)
[facility]  PATIENT: Vincent Evans DOB: 01/09/1951  REFERRING CLINICIAN: Melinda Crutch HISTORY FROM: Patient  REASON FOR VISIT: Multiple Sclerosis   HISTORICAL  CHIEF COMPLAINT:  No chief complaint on file.   HISTORY OF PRESENT ILLNESS:  [name] is a 69 y.o. man with multiple sclerosis.   He is noting that balance and leg strength are worse.    Update 08/03/19 Virtual Visit via Telephone Note  I connected with [name] on 08/04/19 at 11:30 AM EDT by telephone and verified that I am speaking with the correct person using two identifiers.  Location: Patient: Home Provider: Office   I discussed the limitations, risks, security and privacy concerns of performing an evaluation and management service by telephone and the availability of in person appointments. I also discussed with the patient that there may be a patient responsible charge related to this service. The patient expressed understanding and agreed to proceed.  History of Present Illness: He feels he has progressed at a higher rate over the last year than the previous couple of years when he was on Ocrevus.  He would like to get back on the medication.  He tolerated well.  He is noting more weakness in his legs and is relying on the electric wheelchair more,   Both legs weak but left is mildly worse.  a gradual decline with worsening leg strength and more difficulty bearing weight and transfer.   He has muscle spasms, right > left, and gets more spasms at night.  He is on dalfampridine and has a benefit with strength.  It is not covered by insurance and he may need a compounded product again soon.   He is on baclofen 10 mg po 12-03-18 (4/day) with benefit and he tolerate it well.  He has been on baclofen for about 6 or 7 years.  He denies any numbness or dysesthesias.  He has urinary frequency and hesitancy.  Tamsulosin has helped some.  He notes fatigue.  He sleeps well most nights.  Cognition is doing fairly  well..       He has pain in the legs helped by oxycodone.   Additionally he has pedal edema both legs and wears compression hose with benefit.   Observations/Objective: He is alert and oriented with fluent speech and good attention, knowledge and memory.  Assessment and Plan: Multiple sclerosis ([place]) - Plan: Hepatitis B core antibody, total, Hepatitis B surface antigen, QuantiFERON-TB Gold Plus, CBC with Differential/Platelet, Comprehensive metabolic panel, Hepatitis B surface antibody,qualitative  Gait disturbance  Chronic pain syndrome  High risk medication use - Plan: Hepatitis B core antibody, total, Hepatitis B surface antigen, QuantiFERON-TB Gold Plus, CBC with Differential/Platelet, Comprehensive metabolic panel, Hepatitis B surface antibody,qualitative  Urinary urgency  1.  He had been on Ocrevus in the past.  He tolerated it well but was uncertain he was getting a benefit.  He feels that his progression was slowed while on that medication and he would like to reinitiate.  We did discuss that Ocrevus can be associated with a higher risk of pneumonia and also with less efficacy of vaccination.  He is interested in starting and will come by the office next week for blood work and to sign the service request form. 2.    He will continue his dalfampridine for now.  However, of course remains very elevated we will get him compounded product. 3.   Return in 4 months  Follow Up Instructions: I discussed the assessment and treatment  plan with the patient. The patient was provided an opportunity to ask questions and all were answered. The patient agreed with the plan and demonstrated an understanding of the instructions.  The patient was advised to call back or seek an in-person evaluation if the symptoms worsen or if the condition fails to improve as anticipated.  I provided 15 minutes of non-face-to-face time during this encounter.   Britt Bottom, MD :   Update 09/28/2018: He is on  Ocrevus for PPMS.   He tolerates it well.   His right leg has been doing worse over the last year, especially the last 6 months.  He can only go a few steps with a walker.  He is also taking prednisone 20 mg.   We had intended that he takes it as a taper but he has been taking one a day.  He feels better on prednisone.     He has more bladder issues. He has urge incontinence and nocturia.    He notes more straining.   He takes probiotics and fiber without much benefit.    He goes 1-2 times a day.    He is noting a lot more hiccups  He is noting a lot more edema in his legs.   He has trouble getting the compression stocking on.   He has a machine to help apply pressure.   He gets muscle cramps in his legs but there is not a lot of pain or any redness/heat.  He is on HCTZ 25 mg mixed with lisinopril .  We discussed Lasix may help the   Update 03/24/2018: He fell 03/08/2018 and was feeling much weaker and he fell.  His wife had trouble getting him up off the floor.  Additionally the wife notes that his urine was much darker.  He went to the ED 03/08/2018 due to the weakness.  He does not think he hit his head.Marland Kitchen  He was admitted and received 3 days of IV SoluMedrol.  MRI of the cervical spine showed multiple lesions.  MRI of the brain also showed multiple T2/flair hyperintense foci, with possible progression in the pons but similar pattern elsewhere.   At the time he was having fevers (> 100 for one reading and 99.4 during the hospital.   He had influenza A by PCR.       I personally reviewed the MRI of the brain and cervical spine performed 1/14/202020.  I compared the brain MRI to the 03/25/2015 MRI.  The brain shows multiple T2/flair hyperintense foci in the deep and periventricular white matter, pons and right middle cerebellar peduncle.  There are also some changes in the right cingulate region more likely to be ischemic.  For the most part, the MRI looks unchanged compared to the 2017 MRI though there may  be just a little more hyperintense signal within the pons.  This could be demyelinating or ischemic or metabolic.  Atrophy is also noted.  The MRI of the cervical spine shows multiple T2 hyperintense foci in a pattern consistent with MS.  Films were not available for comparison.  Update 01/27/2018: He is on Ocrevus.  He tolerates it well.  The last infusion (the third 1) was September 2019.  He has noted some continued worsening of his neurologic function.  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  Ocrevus is the only FDA approved medication for PMS.    He did try biotin  and alpha lipoic acid but had trouble tolerating both of them due to GI distress.  He feels the right leg has caught up to the weakness of the left leg.  Gait is more difficulty.  With a cane and furniture/walls, he can go about 30 feet without stopping.  He will use a wheelchair when outside..  He has Ampyra (actually compounded for aminopyridine)   .  He is not sure how much it is helping but he did notice improvement when he started on it.    He has some urinary symptoms that are stable.  Vision is fine.  He denies any difficulty with cognition.  He notes mild depression but not bad enough to take medication for.  He sleeps well most nights.  Update 07/27/2017: He is on Ocrevus and he tolerates it well.  Last infusion was in March; this was the second dose.Marland Kitchen    He feels weaker, especially over the last week.   He hasn't been able to use the cane and even with a walker he is doing worse.     Both legs are weak, left worse than right.    He was doing PT in the past at [facility] and he feels it helped some in the past.     On April 3, he fell and landed on his knees and arms.    Ribs were bruised but no fractures.   He feels he has done worse with his gait since the fall.    He is on 4-aminopyridine (compounded).  He tolerates it well and feels it  have been helping his gait some.  He  has urinary urgency and some urge incontinence.   He has occasional constipation.   patient is fine.  He notes some frustration and irritability at times.  He has tried to quit smoking but with more issues from his MS lately, he went back.  He denies any difficulty with cognition.  Update 01/26/2017:     He had his first split dose of Ocrevus in September.   He tolerated it well and there were no infusion reactions.     He feels he has some days where he is doing a little better with gait/balance.      He also feel his gait may be mildly better on compounded Ampyra.  He use a cane for short distance and his walker for longer.    He has constipation (averaging going every other day).   Bladder function is ok during the day but he has 2 x nocturia.   He feels tired every afternoon.     He snores but has not been noted to   He cut his drinking down.   He is using a Vape instead of cigarettes to an extent (still smokes 15 cigarettes a day).  From 08/31/2016: MS:   Currently, he is on Tecfidera. He tolerates it well but has continued to worsen over the last couple of years. We discussed that he has had a more progressive course, really since the onset of his disease, and primary progressive MS is more likely than a relapsing remitting MS.      Gait/strength/sensation:   He has poor balance and left > right weakness and spasticity.   He stumbles but no falls.     He now uses a walker for longer distance outside his home but the cane most of the time.    Ampyra seemed to stop helping so he stopped.   He has  spasticity, worse first thing in the morning     He denies numbness in the legs.     Exercise:  He belongs to a gym ([facility]) and they participate in a Silver Sneakers program.  We discussed that therapy and exercises  may be beneficial and I recommend that he considers doing more.     Pain:   He continues to have pain in the right buttock region and leg more than the left    Oxycodone and baclofen helps the  pain.     He notes his pain in the legs is worse when he lays down at night, especially down the right leg. He notes more spasticity at night also.    He does not have side effects with the baclofen.  Bladder:   He reports bladder urgency and frequency and has had some incontinence. Oxybutynin and Detrol have only helped mildly.  He has 2 x nocturia most nights.    Vision:  He notes no MS related vision problems.  No diplopia or major acuity issues.   He has readers and other glasses for driving.     Fatigue/Sleep:   He reports fatigue that is physical and cognitive. He does try to stay active but notes that walking is more difficult so he is less active than he was last year.       He falls sleeps fairly well most nights.  He has 2 x nocturia but quickly falls asleep.  He naps most days and feels much better afterwards.   He snores at night. He does have some snorting but no definite pauses in his breathing.    He does not want to get a PSG and would not wear CPAP or an oral appliance.    Mood/cognition   he feels he is doing well with mood and denies any depression. However, he sometimes has some apathy. There is no anxiety.    He feels his cognition is doing well and he does activities on the computer without difficulty.       Other:   He is unable to work. 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   MS History:  Around 2013, he  presented with left leg weakness and gait spasticity and clumsiness. He had MRIs of the brain and spine. Findings were consistent with multiple sclerosis. He was referred to me and I placed him on Tecfidera.  Initially, he had GI symptoms and diarrhea from Tecfidera but this is much better now.    He gets frequent CBC's due to his hematologic disorder and lymphocyte count is usually about 1.0.     REVIEW OF SYSTEMS:  Constitutional: No fevers, chills, sweats, or change in appetite.   He  snores and has some insomnia.  He has heat intolerance Eyes: He has notes some visual blurring.  Eyes itch.    He denies double vision, eye pain Ear, nose and throat: No hearing loss, ear pain, nasal congestion, sore throat Cardiovascular: No chest pain, palpitations Respiratory:  He notes cough and occ  SOB GastrointestinaI: No nausea, vomiting, diarrhea, abdominal pain, fecal incontinence Genitourinary:  as above Musculoskeletal:  No neck pain, some back pain Integumentary: No rash, pruritus, skin lesions Neurological: as above.  And has noted RLS Psychiatric: He notes mild depression at this time.  No anxiety Endocrine: No palpitations, diaphoresis, change in appetite, change in weight or increased thirst Hematologic/Lymphatic:  No  anemia, purpura, petechiae. Allergic/Immunologic: No itchy/runny eyes, nasal congestion, recent allergic reactions, rashes  ALLERGIES: No Known Allergies  HOME MEDICATIONS: Outpatient Medications Prior to Visit  Medication Sig Dispense Refill  . atorvastatin (LIPITOR) 10 MG tablet Take 10 mg by mouth daily.    . baclofen (LIORESAL) 10 MG tablet Take two pills three times a day 540 tablet 3  . Cholecalciferol (VITAMIN D3) 125 MCG (5000 UT) CAPS Take 5,000 Units by mouth every evening.    . cilostazol (PLETAL) 100 MG tablet Take 100 mg by mouth 2 (two) times daily.     . Cyanocobalamin (VITAMIN B-12) 5000 MCG TBDP Take 5,000 Units by mouth daily.    Marland Kitchen dalfampridine 10 MG TB12 Take 1 tablet (10 mg total) by mouth every 12 (twelve) hours. 180 tablet 3  . lisinopril-hydrochlorothiazide (PRINZIDE,ZESTORETIC) 20-25 MG tablet Take 1 tablet by mouth daily.  4  . modafinil (PROVIGIL) 200 MG tablet TAKE ONE TABLET BY MOUTH DAILY 30 tablet 5  . Multiple Vitamins-Minerals (AIRBORNE PO) Take 1 Dose by mouth daily.    Marland Kitchen oxyCODONE (OXY IR/ROXICODONE) 5 MG immediate release tablet Take 1 tablet every 6 hours as needed for severe pain. 120 tablet 0  . QUERCETIN PO Take  2,000 mg by mouth daily.    Marland Kitchen spironolactone (ALDACTONE) 100 MG tablet Take 100 mg by mouth daily.    . tamsulosin (FLOMAX) 0.4 MG CAPS capsule Take 1 capsule (0.4 mg total) by mouth daily. 90 capsule 3  . VITAMIN E PO Take 162 mg by mouth daily.     No facility-administered medications prior to visit.    PAST MEDICAL HISTORY: Past Medical History:  Diagnosis Date  . Anxiety 12/22/2013  . Hearing loss   . Movement disorder   . Multiple sclerosis ([place])   . Neuromuscular disorder ([place])    MS  . 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 Vision abnormalities     PAST SURGICAL HISTORY: Past Surgical History:  Procedure Laterality Date  . BUNIONECTOMY      FAMILY HISTORY: Family History  Problem Relation Age of Onset  . Dementia Mother   . Diabetes type II Father   . Hypertension Father   . Cancer Maternal Grandmother        pancreatic ca    SOCIAL HISTORY:  Social History   Socioeconomic History  . Marital status: Married    Spouse name: Not on file  . Number of children: Not on file  . Years of education: Not on file  . Highest education level: Not on file  Occupational History  . Not on file  Tobacco Use  . Smoking status: Current Every Day Smoker    Packs/day: 0.75    Years: 35.00    Pack years: 26.25    Types: Cigarettes  . Smokeless tobacco: Never Used  Substance and Sexual Activity  . Alcohol use: Yes    Alcohol/week: 0.0 standard drinks    Comment: 3 to 6 oz. of Bourbon daily, and an occasional beer./fim  . Drug use: No  . Sexual activity: Not on file  Other Topics Concern  . Not on file  Social History Narrative  . Not on file   Social Determinants of Health   Financial Resource Strain:   . Difficulty of Paying Living Expenses:   Food Insecurity:   . Worried About Charity fundraiser in the Last Year:   . Arboriculturist in the Last Year:   Transportation Needs:   .  Lack of Transportation (Medical):   Marland Kitchen Lack of Transportation (Non-Medical):    Physical Activity:   . Days of Exercise per Week:   . Minutes of Exercise per Session:   Stress:   . Feeling of Stress :   Social Connections:   . Frequency of Communication with Friends and Family:   . Frequency of Social Gatherings with Friends and Family:   . Attends Religious Services:   . Active Member of Clubs or Organizations:   . Attends Archivist Meetings:   Marland Kitchen Marital Status:   Intimate Partner Violence:   . Fear of Current or Ex-Partner:   . Emotionally Abused:   Marland Kitchen Physically Abused:   . Sexually Abused:      PHYSICAL EXAM  There were no vitals filed for this visit.  There is no height or weight on file to calculate BMI.   General: The patient is well-developed and well-nourished and in no acute distress.  He has bilateral pedal edema, right greater than left. Neurologic Exam  Mental status: The patient is alert and oriented x 3 at the time of the examination. The patient has apparent normal recent and remote memory, with an apparently normal attention span and concentration ability.   Speech is normal.  Cranial nerves: Extraocular movements are full.  Facial strength is normal.  Trapezius strength is normal.  The tongue is midline, and the patient has symmetric elevation of the soft palate. No obvious hearing deficits are noted.  Motor:  Muscle bulk is normal.  Muscle tone is increased in the legs, left a little more than the right.  He has normal strength in the arms.  Strength is 2 prox and 2+/5 elesewhere in the right leg and 2/5 hip flexion and 2+/5 elsewhere in the left leg.    Sensory: He has intact sensation to touch and vibration in the legs.  Coordination: Cerebellar testing reveals good finger-nose-finger bilaterally.  He cannot do heel-to-shin.  Gait and station: He can bear some weight but cannot stand without strong bilateral support.  He is unable to walk.  Reflexes: Deep tendon reflexes are increased, left greater than  right.    DIAGNOSTIC DATA (LABS, IMAGING, TESTING) - I reviewed patient records, labs, notes, testing and imaging myself where available.  Lab Results  Component Value Date   WBC 15.4 (H) 09/28/2018   HGB 14.4 09/28/2018   HCT 39.4 09/28/2018   MCV 97 09/28/2018   PLT 273 09/28/2018      ASSESSMENT AND PLAN    1. Multiple sclerosis ([facility])   2. Gait disturbance   3. Chronic pain syndrome   4. High risk medication use   5. Urinary urgency     1.  Continue off Ocrevus.   No need for labwork today.    2.  Continue 4-AP for gait  3.  Continue compression stockings for edema.      4.  Stay active and exercise as tolerated.     5.  Continue tamsulosin for retention.    6.   RTC 6  months or call sooner if he has new or worsening neurologic symptoms.    Latronda Spink A. Felecia Shelling, MD, PhD 9/47/0962, 83:66 AM Certified in Neurology, Clinical Neurophysiology, Sleep Medicine, Pain Medicine and Neuroimaging  [facility] [address] [place], [place] [address] [phone]

## 2019-08-04 ENCOUNTER — Encounter: Payer: Self-pay | Admitting: Neurology

## 2019-08-04 DIAGNOSIS — Z79899 Other long term (current) drug therapy: Secondary | ICD-10-CM | POA: Insufficient documentation

## 2019-08-07 ENCOUNTER — Telehealth: Payer: Self-pay

## 2019-08-07 NOTE — Telephone Encounter (Signed)
Placed ocrevus start form at the front desk for pt to sign when he stops by for labs.

## 2019-08-09 ENCOUNTER — Telehealth: Payer: Self-pay

## 2019-08-09 NOTE — Telephone Encounter (Signed)
-----   Message from Britt Bottom, MD sent at 08/04/2019 11:19 AM EDT ----- Will need follow-up in 4 months

## 2019-08-09 NOTE — Telephone Encounter (Signed)
I called pt. No answer, left a message asking him to call me back.  I will send pt a mychart message.

## 2019-08-10 ENCOUNTER — Other Ambulatory Visit (INDEPENDENT_AMBULATORY_CARE_PROVIDER_SITE_OTHER): Payer: Self-pay

## 2019-08-10 DIAGNOSIS — G35 Multiple sclerosis: Secondary | ICD-10-CM | POA: Diagnosis not present

## 2019-08-10 DIAGNOSIS — Z79899 Other long term (current) drug therapy: Secondary | ICD-10-CM | POA: Diagnosis not present

## 2019-08-10 DIAGNOSIS — Z0289 Encounter for other administrative examinations: Secondary | ICD-10-CM

## 2019-08-13 LAB — CBC WITH DIFFERENTIAL/PLATELET
Basophils Absolute: 0.1 10*3/uL (ref 0.0–0.2)
Basos: 1 %
EOS (ABSOLUTE): 0.1 10*3/uL (ref 0.0–0.4)
Eos: 1 %
Hematocrit: 43.1 % (ref 37.5–51.0)
Hemoglobin: 14.8 g/dL (ref 13.0–17.7)
Immature Grans (Abs): 0.1 10*3/uL (ref 0.0–0.1)
Immature Granulocytes: 1 %
Lymphocytes Absolute: 1.3 10*3/uL (ref 0.7–3.1)
Lymphs: 17 %
MCH: 35.2 pg — ABNORMAL HIGH (ref 26.6–33.0)
MCHC: 34.3 g/dL (ref 31.5–35.7)
MCV: 103 fL — ABNORMAL HIGH (ref 79–97)
Monocytes Absolute: 0.7 10*3/uL (ref 0.1–0.9)
Monocytes: 10 %
Neutrophils Absolute: 5.4 10*3/uL (ref 1.4–7.0)
Neutrophils: 70 %
Platelets: 213 10*3/uL (ref 150–450)
RBC: 4.2 x10E6/uL (ref 4.14–5.80)
RDW: 12.1 % (ref 11.6–15.4)
WBC: 7.7 10*3/uL (ref 3.4–10.8)

## 2019-08-13 LAB — COMPREHENSIVE METABOLIC PANEL
ALT: 21 IU/L (ref 0–44)
AST: 21 IU/L (ref 0–40)
Albumin/Globulin Ratio: 1.8 (ref 1.2–2.2)
Albumin: 4.5 g/dL (ref 3.8–4.8)
Alkaline Phosphatase: 84 IU/L (ref 48–121)
BUN/Creatinine Ratio: 11 (ref 10–24)
BUN: 14 mg/dL (ref 8–27)
Bilirubin Total: 0.4 mg/dL (ref 0.0–1.2)
CO2: 24 mmol/L (ref 20–29)
Calcium: 9.8 mg/dL (ref 8.6–10.2)
Chloride: 93 mmol/L — ABNORMAL LOW (ref 96–106)
Creatinine, Ser: 1.29 mg/dL — ABNORMAL HIGH (ref 0.76–1.27)
GFR calc Af Amer: 65 mL/min/{1.73_m2} (ref >59)
GFR calc non Af Amer: 57 mL/min/{1.73_m2} — ABNORMAL LOW (ref >59)
Globulin, Total: 2.5 g/dL (ref 1.5–4.5)
Glucose: 132 mg/dL — ABNORMAL HIGH (ref 65–99)
Potassium: 5.2 mmol/L (ref 3.5–5.2)
Sodium: 133 mmol/L — ABNORMAL LOW (ref 134–144)
Total Protein: 7 g/dL (ref 6.0–8.5)

## 2019-08-13 LAB — QUANTIFERON-TB GOLD PLUS
QuantiFERON Mitogen Value: 8.04 IU/mL
QuantiFERON Nil Value: 0 IU/mL
QuantiFERON TB1 Ag Value: 0.02 IU/mL
QuantiFERON TB2 Ag Value: 0.01 IU/mL
QuantiFERON-TB Gold Plus: NEGATIVE

## 2019-08-13 LAB — HEPATITIS B CORE ANTIBODY, TOTAL: Hep B Core Total Ab: NEGATIVE

## 2019-08-13 LAB — HEPATITIS B SURFACE ANTIBODY,QUALITATIVE: Hep B Surface Ab, Qual: NONREACTIVE

## 2019-08-13 LAB — HEPATITIS B SURFACE ANTIGEN: Hepatitis B Surface Ag: NEGATIVE

## 2019-08-14 ENCOUNTER — Telehealth: Payer: Self-pay

## 2019-08-14 NOTE — Telephone Encounter (Signed)
I called pt. I advised him that we will send in the ocrevus start form. He voiced that he is still not 100% sure if he wants to proceed with ocrevus but will give it a try. Ocrevus start form has been faxed to [facility]. Received a receipt of confirmation. Copy of start form given to [facility].

## 2019-08-14 NOTE — Telephone Encounter (Signed)
Ocrevus start form faxed to [facility]. Received a receipt of confirmation.  Gave a copy of start form to [facility].

## 2019-08-14 NOTE — Telephone Encounter (Signed)
-----   Message from Britt Bottom, MD sent at 08/14/2019  8:44 AM EDT ----- If not already done so, we can send in the [place] form

## 2019-08-15 ENCOUNTER — Other Ambulatory Visit: Payer: Self-pay | Admitting: *Deleted

## 2019-08-15 MED ORDER — OCRELIZUMAB 300 MG/10ML IV SOLN: INTRAVENOUS | 3 refills | Status: AC

## 2019-08-15 NOTE — Addendum Note (Signed)
Addended by: Lester Newburg A on: 08/15/2019 08:30 AM   Modules accepted: Orders

## 2019-08-15 NOTE — Telephone Encounter (Signed)
Spoke with Jeb Levering, RN in [facility]. RX for ocrevus should be sent to [facility] for the [facility].

## 2019-08-22 ENCOUNTER — Other Ambulatory Visit: Payer: Self-pay | Admitting: Neurology

## 2019-08-22 ENCOUNTER — Encounter: Payer: Self-pay | Admitting: Neurology

## 2019-08-22 ENCOUNTER — Telehealth: Payer: Self-pay | Admitting: Neurology

## 2019-08-22 DIAGNOSIS — G35 Multiple sclerosis: Secondary | ICD-10-CM

## 2019-08-22 MED ORDER — OXYCODONE HCL 5 MG PO TABS: ORAL_TABLET | ORAL | 0 refills | Status: DC

## 2019-08-22 NOTE — Telephone Encounter (Signed)
Pt has called for a refill on his  oxyCODONE (OXY IR/ROXICODONE) 5 MG immediate release tablet to  [facility] Pt would also like a call from RN to discuss getting back on Ocrevus

## 2019-08-22 NOTE — Telephone Encounter (Signed)
Called the patient to discuss concern in regards to the ocrevus. Patient is set up to restart the medication. Patient was asking if he will have to restart and titrate up on the medication or if he can get full dose since he had been on the medication couple years ago. Advised the patient that he will have to get half the dose first and then 2nd half. Informed that information has been submitted to infusion suite and someone should be in touch with him soon to get him scheduled. Pt verbalized understanding.

## 2019-08-22 NOTE — Telephone Encounter (Signed)
I have routed this request to Dr Krista Blue for review since Dr Felecia Shelling has left. The pt is due for the medication and [place] registry was verified.

## 2019-09-01 DIAGNOSIS — I10 Essential (primary) hypertension: Secondary | ICD-10-CM | POA: Diagnosis not present

## 2019-09-01 DIAGNOSIS — E782 Mixed hyperlipidemia: Secondary | ICD-10-CM | POA: Diagnosis not present

## 2019-09-01 DIAGNOSIS — M810 Age-related osteoporosis without current pathological fracture: Secondary | ICD-10-CM | POA: Diagnosis not present

## 2019-09-14 DIAGNOSIS — G35 Multiple sclerosis: Secondary | ICD-10-CM | POA: Diagnosis not present

## 2019-09-25 ENCOUNTER — Other Ambulatory Visit: Payer: Self-pay | Admitting: Neurology

## 2019-09-25 DIAGNOSIS — G35 Multiple sclerosis: Secondary | ICD-10-CM

## 2019-09-25 MED ORDER — OXYCODONE HCL 5 MG PO TABS: ORAL_TABLET | ORAL | 0 refills | Status: DC

## 2019-09-25 NOTE — Telephone Encounter (Signed)
Pt is requesting a refill for oxyCODONE (OXY IR/ROXICODONE) 5 MG immediate release tablet.  Pharmacy: [facility]  

## 2019-09-25 NOTE — Addendum Note (Signed)
Addended by: Wyvonnia Lora on: 09/25/2019 10:09 AM   Modules accepted: Orders

## 2019-09-28 DIAGNOSIS — G35 Multiple sclerosis: Secondary | ICD-10-CM | POA: Diagnosis not present

## 2019-10-01 DIAGNOSIS — Z1212 Encounter for screening for malignant neoplasm of rectum: Secondary | ICD-10-CM | POA: Diagnosis not present

## 2019-10-01 DIAGNOSIS — Z1211 Encounter for screening for malignant neoplasm of colon: Secondary | ICD-10-CM | POA: Diagnosis not present

## 2019-10-26 ENCOUNTER — Other Ambulatory Visit: Payer: Self-pay | Admitting: Neurology

## 2019-10-26 DIAGNOSIS — G35 Multiple sclerosis: Secondary | ICD-10-CM

## 2019-10-26 MED ORDER — OXYCODONE HCL 5 MG PO TABS: ORAL_TABLET | ORAL | 0 refills | Status: DC

## 2019-10-26 NOTE — Telephone Encounter (Signed)
Pt is requesting a refill for oxyCODONE (OXY IR/ROXICODONE) 5 MG immediate release tablet.  Pharmacy: [facility]

## 2019-10-26 NOTE — Addendum Note (Signed)
Addended by: Wyvonnia Lora on: 10/26/2019 12:15 PM   Modules accepted: Orders

## 2019-11-15 DIAGNOSIS — R899 Unspecified abnormal finding in specimens from other organs, systems and tissues: Secondary | ICD-10-CM | POA: Diagnosis not present

## 2019-11-15 DIAGNOSIS — Z125 Encounter for screening for malignant neoplasm of prostate: Secondary | ICD-10-CM | POA: Diagnosis not present

## 2019-11-15 DIAGNOSIS — R350 Frequency of micturition: Secondary | ICD-10-CM | POA: Diagnosis not present

## 2019-11-15 DIAGNOSIS — E782 Mixed hyperlipidemia: Secondary | ICD-10-CM | POA: Diagnosis not present

## 2019-11-15 DIAGNOSIS — R7303 Prediabetes: Secondary | ICD-10-CM | POA: Diagnosis not present

## 2019-11-15 DIAGNOSIS — R799 Abnormal finding of blood chemistry, unspecified: Secondary | ICD-10-CM | POA: Diagnosis not present

## 2019-11-15 DIAGNOSIS — L0292 Furuncle, unspecified: Secondary | ICD-10-CM | POA: Diagnosis not present

## 2019-11-15 DIAGNOSIS — G35 Multiple sclerosis: Secondary | ICD-10-CM | POA: Diagnosis not present

## 2019-11-15 DIAGNOSIS — I10 Essential (primary) hypertension: Secondary | ICD-10-CM | POA: Diagnosis not present

## 2019-11-16 DIAGNOSIS — R195 Other fecal abnormalities: Secondary | ICD-10-CM | POA: Diagnosis not present

## 2019-11-23 ENCOUNTER — Other Ambulatory Visit: Payer: Self-pay | Admitting: Neurology

## 2019-11-23 DIAGNOSIS — G35 Multiple sclerosis: Secondary | ICD-10-CM

## 2019-11-23 MED ORDER — OXYCODONE HCL 5 MG PO TABS: ORAL_TABLET | ORAL | 0 refills | Status: DC

## 2019-11-23 NOTE — Telephone Encounter (Signed)
Pt is requesting a refill for  oxyCODONE (OXY IR/ROXICODONE) 5 MG immediate release tablet .  Pharmacy: [facility]

## 2019-11-23 NOTE — Addendum Note (Signed)
Addended by: Wyvonnia Lora on: 11/23/2019 10:16 AM   Modules accepted: Orders

## 2019-11-29 DIAGNOSIS — E782 Mixed hyperlipidemia: Secondary | ICD-10-CM | POA: Diagnosis not present

## 2019-11-29 DIAGNOSIS — M810 Age-related osteoporosis without current pathological fracture: Secondary | ICD-10-CM | POA: Diagnosis not present

## 2019-11-29 DIAGNOSIS — I10 Essential (primary) hypertension: Secondary | ICD-10-CM | POA: Diagnosis not present

## 2019-11-29 DIAGNOSIS — E1169 Type 2 diabetes mellitus with other specified complication: Secondary | ICD-10-CM | POA: Diagnosis not present

## 2019-12-19 DIAGNOSIS — R69 Illness, unspecified: Secondary | ICD-10-CM | POA: Diagnosis not present

## 2019-12-20 ENCOUNTER — Ambulatory Visit: Payer: Self-pay | Admitting: Neurology

## 2019-12-21 ENCOUNTER — Ambulatory Visit: Payer: Self-pay | Admitting: Neurology

## 2020-01-01 ENCOUNTER — Other Ambulatory Visit: Payer: Self-pay | Admitting: Neurology

## 2020-01-01 DIAGNOSIS — G35D Multiple sclerosis, unspecified: Secondary | ICD-10-CM

## 2020-01-01 DIAGNOSIS — G35 Multiple sclerosis: Secondary | ICD-10-CM

## 2020-01-01 MED ORDER — OXYCODONE HCL 5 MG PO TABS: ORAL_TABLET | ORAL | 0 refills | Status: DC

## 2020-01-01 NOTE — Telephone Encounter (Signed)
Pt is needing a refill on his oxyCODONE (OXY IR/ROXICODONE) 5 MG immediate release tablet sent in to the [facility] on [address].

## 2020-01-26 ENCOUNTER — Telehealth: Payer: Self-pay | Admitting: Neurology

## 2020-01-26 NOTE — Telephone Encounter (Signed)
Pt's appt has been changed to a Mychart Visit.

## 2020-01-26 NOTE — Telephone Encounter (Signed)
Yes it can, can you please help with this? Thank you

## 2020-01-26 NOTE — Telephone Encounter (Signed)
Pt called wanting to know if his appt on Monday morning can be switched to a VV. Please advise.

## 2020-01-29 ENCOUNTER — Telehealth (INDEPENDENT_AMBULATORY_CARE_PROVIDER_SITE_OTHER): Payer: Medicare HMO | Admitting: Neurology

## 2020-01-29 ENCOUNTER — Other Ambulatory Visit: Payer: Self-pay | Admitting: Neurology

## 2020-01-29 ENCOUNTER — Encounter: Payer: Self-pay | Admitting: Neurology

## 2020-01-29 DIAGNOSIS — R261 Paralytic gait: Secondary | ICD-10-CM | POA: Diagnosis not present

## 2020-01-29 DIAGNOSIS — R3915 Urgency of urination: Secondary | ICD-10-CM

## 2020-01-29 DIAGNOSIS — G35 Multiple sclerosis: Secondary | ICD-10-CM | POA: Diagnosis not present

## 2020-01-29 DIAGNOSIS — D751 Secondary polycythemia: Secondary | ICD-10-CM

## 2020-01-29 DIAGNOSIS — G8220 Paraplegia, unspecified: Secondary | ICD-10-CM

## 2020-01-29 DIAGNOSIS — G894 Chronic pain syndrome: Secondary | ICD-10-CM

## 2020-01-29 MED ORDER — MODAFINIL 200 MG PO TABS: 200.0000 mg | ORAL_TABLET | Freq: Every day | ORAL | 2 refills | Status: AC

## 2020-01-29 MED ORDER — OXYCODONE HCL 5 MG PO TABS: ORAL_TABLET | ORAL | 0 refills | Status: DC

## 2020-01-29 MED ORDER — TAMSULOSIN HCL 0.4 MG PO CAPS: 0.4000 mg | ORAL_CAPSULE | Freq: Every day | ORAL | 4 refills | Status: AC

## 2020-01-29 MED ORDER — BACLOFEN 10 MG PO TABS: ORAL_TABLET | ORAL | 3 refills | Status: AC

## 2020-01-29 NOTE — Progress Notes (Signed)
[facility]  PATIENT: Vincent Evans DOB: 1950-02-24  REFERRING CLINICIAN: Melinda Crutch HISTORY FROM: Patient  REASON FOR VISIT: Multiple Sclerosis   HISTORICAL  CHIEF COMPLAINT:  No chief complaint on file.   HISTORY OF PRESENT ILLNESS:  Vincent Evans is a 69 y.o. man with multiple sclerosis.   He is noting that balance and leg strength are worse.    Update 08/03/19 Virtual Visit via Telephone Note I connected with [name] on 01/29/20 at 11:30 AM EST by telephone and verified that I am speaking with the correct person using two identifiers.  Location: Patient: Home Provider: Office   I discussed the limitations, risks, security and privacy concerns of performing an evaluation and management service by telephone and the availability of in person appointments. I also discussed with the patient that there may be a patient responsible charge related to this service. The patient expressed understanding and agreed to proceed.  History of Present Illness: He has progressed further.   He needs a Hoyer lift to get in/out bed sometimes (other times able to be helped into chair from bed).   He can bear some weight.       He is noting more weakness in his legs and is relying on the electric wheelchair now,   Both legs weak but left is mildly worse.  He has muscle spasms, right > left, and gets more spasms at night.  He is on dalfampridine and has a benefit with strength.  It is not covered by insurance and he may need a compounded product again soon.   He is on baclofen 10 mg po 12-03-18 (4/day) with benefit and he tolerate it well.  He has been on baclofen for about 6 or 7 years.  He denies any numbness or dysesthesias.  He has urinary frequency and hesitancy.  Tamsulosin has helped some.    His wife notes he has some confusion and seems paranoid at times.  His wife notes  He has be ome more fearful on slopes in his wheelchair. He notes fatigue.  He sleeps well most nights.     He has pain in the legs helped by oxycodone.   Additionally he has pedal edema both legs and wears compression hose with benefit.    MS History:  Around 2013, he  presented with left leg weakness and gait spasticity and clumsiness. He had MRIs of the brain and spine. Findings were consistent with multiple sclerosis. He was referred to me and I placed him on Tecfidera.  Initially, he had GI symptoms and diarrhea from Tecfidera but this is much better now.    He gets frequent CBC's due to his hematologic disorder and lymphocyte count is usually about 1.0.  In retrospect, it was felt that his symptoms had progressed over time with no definite exacerbation.  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  He did not show much benefit and stopped  MRI of the brain performed 03/08/2018 compared the brain MRI to the 03/25/2015 MRI.  The brain shows multiple T2/flair hyperintense foci in the deep and periventricular white matter, pons and right middle cerebellar peduncle.  There are also some changes in the right cingulate region more likely to be ischemic.  For the most part, the MRI looks unchanged compared to the 2017 MRI though there may be just a little more hyperintense signal within the pons.   Atrophy is also noted.    The MRI of the cervical spine 03/08/2018 shows multiple T2  hyperintense foci in a pattern consistent with MS.  Films were not available for comparison.  Observations/Objective: Wife took vitals.   SaO2 was 98%, pulse was 56, T = 97.9 F, BP 134/71, weight is 225.   He is alert and oriented with fluent speech and good attention, knowledge and memory.     He is a well-developed well-nourished man in no acute distress.  The head is normocephalic and atraumatic.  Sclera are anicteric.  Visible skin appears normal.  The neck has a good range of motion.   He is alert and fully oriented with fluent speech and good attention, knowledge and memory.  Extraocular muscles are intact.  Facial  strength is normal  Rapid alternating movements is mildly slowed in the hands,  finger-nose-finger performed well.    REVIEW OF SYSTEMS:  Constitutional: No fevers, chills, sweats, or change in appetite.   He snores and has some insomnia.  He has heat intolerance Eyes: He has notes some visual blurring.  Eyes itch.    He denies double vision, eye pain Ear, nose and throat: No hearing loss, ear pain, nasal congestion, sore throat Cardiovascular: No chest pain, palpitations Respiratory:  He notes cough and occ  SOB GastrointestinaI: No nausea, vomiting, diarrhea, abdominal pain, fecal incontinence Genitourinary:  as above Musculoskeletal:  No neck pain, some back pain Integumentary: No rash, pruritus, skin lesions Neurological: as above.  And has noted RLS Psychiatric: He notes mild depression at this time.  No anxiety Endocrine: No palpitations, diaphoresis, change in appetite, change in weight or increased thirst Hematologic/Lymphatic:  No anemia, purpura, petechiae. Allergic/Immunologic: No itchy/runny eyes, nasal congestion, recent allergic reactions, rashes  ALLERGIES: No Known Allergies  HOME MEDICATIONS: Outpatient Medications Prior to Visit  Medication Sig Dispense Refill  . atorvastatin (LIPITOR) 10 MG tablet Take 20 mg by mouth daily.     . Cholecalciferol (VITAMIN D3) 125 MCG (5000 UT) CAPS Take 5,000 Units by mouth in the morning and at bedtime.     . Cyanocobalamin (VITAMIN B-12) 5000 MCG TBDP Take 5,000 Units by mouth daily.    Marland Kitchen dalfampridine 10 MG TB12 Take 1 tablet (10 mg total) by mouth every 12 (twelve) hours. 180 tablet 3  . gabapentin (NEURONTIN) 300 MG capsule Take 300 mg by mouth every evening.    Marland Kitchen lisinopril (ZESTRIL) 10 MG tablet Take 10 mg by mouth daily.    . Multiple Vitamins-Minerals (AIRBORNE PO) Take 2 Doses by mouth daily. 1 dose at 12pm and one in the evening    . Multiple Vitamins-Minerals (MULTIVITAMIN MEN PO) Take 1 tablet by mouth daily.    .  Multiple Vitamins-Minerals (ZINC PO) Take 50 mg by mouth daily.    Marland Kitchen ocrelizumab (OCREVUS) 300 MG/10ML injection Ocrevus 300mg  IV on day 1 and day 15. Ocrevus 600mg  IV every 6 months thereafter. 3600 mL 3  . QUERCETIN PO Take 1,000 mg by mouth daily.     Marland Kitchen spironolactone (ALDACTONE) 100 MG tablet Take 100 mg by mouth daily.    . baclofen (LIORESAL) 10 MG tablet Take two pills three times a day 540 tablet 3  . modafinil (PROVIGIL) 200 MG tablet TAKE ONE TABLET BY MOUTH DAILY 30 tablet 5  . oxyCODONE (OXY IR/ROXICODONE) 5 MG immediate release tablet Take 1 tablet every 6 hours as needed for severe pain. 120 tablet 0  . tamsulosin (FLOMAX) 0.4 MG CAPS capsule Take 1 capsule (0.4 mg total) by mouth daily. 90 capsule 3   No facility-administered medications prior  to visit.    PAST MEDICAL HISTORY: Past Medical History:  Diagnosis Date  . Anxiety 12/22/2013  . Hearing loss   . Movement disorder   . Multiple sclerosis ([place])   . Neuromuscular disorder ([place])    MS  . 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 Vision abnormalities     PAST SURGICAL HISTORY: Past Surgical History:  Procedure Laterality Date  . BUNIONECTOMY      FAMILY HISTORY: Family History  Problem Relation Age of Onset  . Dementia Mother   . Diabetes type II Father   . Hypertension Father   . Cancer Maternal Grandmother        pancreatic ca      ASSESSMENT AND PLAN    1. Multiple sclerosis ([place])   2. Chronic pain syndrome   3. Spastic gait   4. 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 Secondary erythrocytosis     1.  He will stop the Ocrevus.  He does not think it has slowed his progression down any.  We did discuss that it is more probable that earlier in younger primary progressive MS would have a higher likelihood of response.    2.  Continue 4-AP for strength 3.  Stay active and exercise as tolerated.    We discussed try to get outdoors most days. 5.  Continue  tamsulosin for retention.    6.   Continue oxycodone for chronic pain.  I will send in a refill.  The PDMP shows compliance.  He has not shown drug-seeking behavior.  Due to difficulties with travel and the COVID-19 pandemic, we have been unable to check a urinary tox screen over the past year. RTC 6  months or call sooner if he has new or worsening neurologic symptoms.    Follow Up Instructions: I discussed the assessment and treatment plan with the patient. The patient was provided an opportunity to ask questions and all were answered. The patient agreed with the plan and demonstrated an understanding of the instructions.  The patient was advised to call back or seek an in-person evaluation if the symptoms worsen or if the condition fails to improve as anticipated.  I provided 30 minutes of non-face-to-face time (18 on video and 12 documentation, record review) during this encounter.   Britt Bottom, MD   Yovanni Frenette A. Felecia Shelling, MD, PhD 94/02/7406, 14:48 PM Certified in Neurology, Clinical Neurophysiology, Sleep Medicine, Pain Medicine and Neuroimaging  [facility] [address], [place], [place] [address] [phone]

## 2020-01-29 NOTE — Addendum Note (Signed)
Addended by: Roberts Gaudy L on: 01/29/2020 11:10 AM   Modules accepted: Orders

## 2020-01-29 NOTE — Telephone Encounter (Signed)
Called and spoke with pt/wife. Updated med list, pharmacy, allergy list. Advised Dr. Felecia Shelling is running a little behind this am. May not be on right at 11:30pm. They verbalized understanding and appreciation. They are wanting to discuss whether he should continue on Ocrevus or not. States he has worsened in the last three months and unsure if Carita Pian is providing any real benefit.

## 2020-02-19 DIAGNOSIS — E1169 Type 2 diabetes mellitus with other specified complication: Secondary | ICD-10-CM | POA: Diagnosis not present

## 2020-02-19 DIAGNOSIS — E782 Mixed hyperlipidemia: Secondary | ICD-10-CM | POA: Diagnosis not present

## 2020-02-19 DIAGNOSIS — I10 Essential (primary) hypertension: Secondary | ICD-10-CM | POA: Diagnosis not present

## 2020-02-19 DIAGNOSIS — M810 Age-related osteoporosis without current pathological fracture: Secondary | ICD-10-CM | POA: Diagnosis not present

## 2020-03-05 ENCOUNTER — Telehealth: Payer: Self-pay | Admitting: Neurology

## 2020-03-05 DIAGNOSIS — G35 Multiple sclerosis: Secondary | ICD-10-CM

## 2020-03-05 MED ORDER — OXYCODONE HCL 5 MG PO TABS: ORAL_TABLET | ORAL | 0 refills | Status: AC

## 2020-03-05 NOTE — Telephone Encounter (Signed)
Pt is needing a refill on his oxyCODONE (OXY IR/ROXICODONE) 5 MG immediate release tablet sent in to the [facility] on [address]. 

## 2020-03-07 ENCOUNTER — Telehealth: Payer: Self-pay | Admitting: Neurology

## 2020-03-07 ENCOUNTER — Other Ambulatory Visit: Payer: Self-pay | Admitting: Neurology

## 2020-03-07 DIAGNOSIS — D751 Secondary polycythemia: Secondary | ICD-10-CM

## 2020-03-07 NOTE — Telephone Encounter (Signed)
Dr. Felecia Shelling- what would you recommend? You last saw pt on 01/29/20

## 2020-03-07 NOTE — Telephone Encounter (Signed)
Pt.'s wife Jenny Reichmann is on [place]. She states husband has severe, chronic pain on right side of face. She states they went to dentist today to make sure it wasn't a bad tooth & dentist informed them that it seems like it could be trigeminal neuralgia. She's asking for husband to be seen sooner than April appt. Please advise.

## 2020-03-08 ENCOUNTER — Other Ambulatory Visit: Payer: Self-pay | Admitting: Neurology

## 2020-03-08 MED ORDER — OXCARBAZEPINE 300 MG PO TABS: ORAL_TABLET | ORAL | 5 refills | Status: AC

## 2020-03-14 ENCOUNTER — Telehealth: Payer: Self-pay | Admitting: Neurology

## 2020-03-14 NOTE — Telephone Encounter (Signed)
Pt's wife has called to report Oxcarbazepine (TRILEPTAL) 300 MG tablet causing pt to be in extreme pain, face to be swollen,  causes pt eyes to roll and to not be himself at all.  Wife is asking for a call from RN as soon as possible.

## 2020-03-14 NOTE — Telephone Encounter (Signed)
He can increase the oxycodone to 5 pills a day.  (He will run out a little early but we can refill the next prescription a few days early and then I will increase it to 5 pills a day on the prescription)

## 2020-03-14 NOTE — Telephone Encounter (Signed)
Called back. Relayed Dr. Garth Bigness message. She verbalized understanding. She asked if it was fine for him to take ibuprofen and oxycodone together. I messaged MD, he ok'd this. She also asked if he has to continue Ampyra. No longer ambulatory. Advised per MD, he can stop this. She verbalized understanding. She will call back on Monday to get a follow up set up for him to see Dr. Felecia Shelling, she will need to set up transportation.

## 2020-03-14 NOTE — Telephone Encounter (Signed)
Called Vincent Evans back. He took medication 03/09/20 and 03/10/20. Then she stopped it d/t SE. SE have resolved. He is still in severe pain. Would like alternative if able. She has been giving him 4 ibuprofen and oxycodone every 5.5 hr currently. Only thing that helps. Advised I will send MD a message and call back

## 2020-03-14 NOTE — Telephone Encounter (Signed)
The medication should not cause increase pain.    If having side effects he should stop.

## 2020-03-15 ENCOUNTER — Other Ambulatory Visit: Payer: Self-pay

## 2020-03-15 ENCOUNTER — Emergency Department (HOSPITAL_COMMUNITY): Payer: Medicare HMO

## 2020-03-15 ENCOUNTER — Inpatient Hospital Stay (HOSPITAL_COMMUNITY)
Admission: EM | Admit: 2020-03-15 | Discharge: 2020-03-26 | DRG: 951 | Disposition: E | Payer: Medicare HMO | Attending: Family Medicine | Admitting: Family Medicine

## 2020-03-15 DIAGNOSIS — R571 Hypovolemic shock: Secondary | ICD-10-CM | POA: Diagnosis present

## 2020-03-15 DIAGNOSIS — G894 Chronic pain syndrome: Secondary | ICD-10-CM | POA: Diagnosis present

## 2020-03-15 DIAGNOSIS — F22 Delusional disorders: Secondary | ICD-10-CM | POA: Diagnosis present

## 2020-03-15 DIAGNOSIS — N179 Acute kidney failure, unspecified: Secondary | ICD-10-CM | POA: Diagnosis not present

## 2020-03-15 DIAGNOSIS — Z7401 Bed confinement status: Secondary | ICD-10-CM

## 2020-03-15 DIAGNOSIS — Z66 Do not resuscitate: Secondary | ICD-10-CM | POA: Diagnosis present

## 2020-03-15 DIAGNOSIS — Z800 Family history of malignant neoplasm of digestive organs: Secondary | ICD-10-CM

## 2020-03-15 DIAGNOSIS — G259 Extrapyramidal and movement disorder, unspecified: Secondary | ICD-10-CM | POA: Diagnosis present

## 2020-03-15 DIAGNOSIS — D751 Secondary polycythemia: Secondary | ICD-10-CM | POA: Diagnosis present

## 2020-03-15 DIAGNOSIS — Z515 Encounter for palliative care: Secondary | ICD-10-CM

## 2020-03-15 DIAGNOSIS — I517 Cardiomegaly: Secondary | ICD-10-CM | POA: Diagnosis not present

## 2020-03-15 DIAGNOSIS — R778 Other specified abnormalities of plasma proteins: Secondary | ICD-10-CM | POA: Diagnosis present

## 2020-03-15 DIAGNOSIS — F17210 Nicotine dependence, cigarettes, uncomplicated: Secondary | ICD-10-CM | POA: Diagnosis present

## 2020-03-15 DIAGNOSIS — R4182 Altered mental status, unspecified: Secondary | ICD-10-CM | POA: Diagnosis not present

## 2020-03-15 DIAGNOSIS — Z833 Family history of diabetes mellitus: Secondary | ICD-10-CM

## 2020-03-15 DIAGNOSIS — F419 Anxiety disorder, unspecified: Secondary | ICD-10-CM | POA: Diagnosis present

## 2020-03-15 DIAGNOSIS — G35 Multiple sclerosis: Secondary | ICD-10-CM | POA: Diagnosis present

## 2020-03-15 DIAGNOSIS — Z8249 Family history of ischemic heart disease and other diseases of the circulatory system: Secondary | ICD-10-CM

## 2020-03-15 DIAGNOSIS — D539 Nutritional anemia, unspecified: Secondary | ICD-10-CM

## 2020-03-15 DIAGNOSIS — G041 Tropical spastic paraplegia: Secondary | ICD-10-CM | POA: Diagnosis present

## 2020-03-15 DIAGNOSIS — R519 Headache, unspecified: Secondary | ICD-10-CM | POA: Diagnosis present

## 2020-03-15 DIAGNOSIS — Z79899 Other long term (current) drug therapy: Secondary | ICD-10-CM

## 2020-03-15 DIAGNOSIS — R9431 Abnormal electrocardiogram [ECG] [EKG]: Secondary | ICD-10-CM | POA: Diagnosis not present

## 2020-03-15 DIAGNOSIS — I249 Acute ischemic heart disease, unspecified: Secondary | ICD-10-CM | POA: Diagnosis present

## 2020-03-15 DIAGNOSIS — R579 Shock, unspecified: Secondary | ICD-10-CM | POA: Diagnosis present

## 2020-03-15 DIAGNOSIS — H9190 Unspecified hearing loss, unspecified ear: Secondary | ICD-10-CM | POA: Diagnosis present

## 2020-03-15 DIAGNOSIS — Z20822 Contact with and (suspected) exposure to covid-19: Secondary | ICD-10-CM | POA: Diagnosis present

## 2020-03-15 DIAGNOSIS — R0600 Dyspnea, unspecified: Secondary | ICD-10-CM | POA: Diagnosis not present

## 2020-03-15 LAB — CBC WITH DIFFERENTIAL/PLATELET
Abs Immature Granulocytes: 0.4 10*3/uL — ABNORMAL HIGH (ref 0.00–0.07)
Basophils Absolute: 0.1 10*3/uL (ref 0.0–0.1)
Basophils Relative: 1 %
Eosinophils Absolute: 0.1 10*3/uL (ref 0.0–0.5)
Eosinophils Relative: 1 %
HCT: 35.2 % — ABNORMAL LOW (ref 39.0–52.0)
Hemoglobin: 11.9 g/dL — ABNORMAL LOW (ref 13.0–17.0)
Immature Granulocytes: 3 %
Lymphocytes Relative: 13 %
Lymphs Abs: 1.7 10*3/uL (ref 0.7–4.0)
MCH: 34.6 pg — ABNORMAL HIGH (ref 26.0–34.0)
MCHC: 33.8 g/dL (ref 30.0–36.0)
MCV: 102.3 fL — ABNORMAL HIGH (ref 80.0–100.0)
Monocytes Absolute: 1 10*3/uL (ref 0.1–1.0)
Monocytes Relative: 8 %
Neutro Abs: 10.3 10*3/uL — ABNORMAL HIGH (ref 1.7–7.7)
Neutrophils Relative %: 74 %
Platelets: 284 10*3/uL (ref 150–400)
RBC: 3.44 MIL/uL — ABNORMAL LOW (ref 4.22–5.81)
RDW: 13.7 % (ref 11.5–15.5)
WBC: 13.6 10*3/uL — ABNORMAL HIGH (ref 4.0–10.5)
nRBC: 0 % (ref 0.0–0.2)

## 2020-03-15 LAB — URINALYSIS, ROUTINE W REFLEX MICROSCOPIC
Bilirubin Urine: NEGATIVE
Glucose, UA: NEGATIVE mg/dL
Hgb urine dipstick: NEGATIVE
Ketones, ur: NEGATIVE mg/dL
Leukocytes,Ua: NEGATIVE
Nitrite: NEGATIVE
Protein, ur: NEGATIVE mg/dL
Specific Gravity, Urine: 1.012 (ref 1.005–1.030)
pH: 5 (ref 5.0–8.0)

## 2020-03-15 LAB — COMPREHENSIVE METABOLIC PANEL
ALT: 35 U/L (ref 0–44)
AST: 71 U/L — ABNORMAL HIGH (ref 15–41)
Albumin: 3.1 g/dL — ABNORMAL LOW (ref 3.5–5.0)
Alkaline Phosphatase: 135 U/L — ABNORMAL HIGH (ref 38–126)
Anion gap: 14 (ref 5–15)
BUN: 40 mg/dL — ABNORMAL HIGH (ref 8–23)
CO2: 22 mmol/L (ref 22–32)
Calcium: 8.8 mg/dL — ABNORMAL LOW (ref 8.9–10.3)
Chloride: 95 mmol/L — ABNORMAL LOW (ref 98–111)
Creatinine, Ser: 2.66 mg/dL — ABNORMAL HIGH (ref 0.61–1.24)
GFR, Estimated: 25 mL/min — ABNORMAL LOW (ref >60)
Glucose, Bld: 91 mg/dL (ref 70–99)
Potassium: 4.7 mmol/L (ref 3.5–5.1)
Sodium: 131 mmol/L — ABNORMAL LOW (ref 135–145)
Total Bilirubin: 0.4 mg/dL (ref 0.3–1.2)
Total Protein: 6.2 g/dL — ABNORMAL LOW (ref 6.5–8.1)

## 2020-03-15 LAB — BLOOD GAS, VENOUS
Acid-base deficit: 3.6 mmol/L — ABNORMAL HIGH (ref 0.0–2.0)
Bicarbonate: 22.7 mmol/L (ref 20.0–28.0)
FIO2: 21
O2 Saturation: 23.2 %
Patient temperature: 98.6
pCO2, Ven: 50 mmHg (ref 44.0–60.0)
pH, Ven: 7.279 (ref 7.250–7.430)
pO2, Ven: 20.5 mmHg — CL (ref 32.0–45.0)

## 2020-03-15 LAB — I-STAT CHEM 8, ED
BUN: 49 mg/dL — ABNORMAL HIGH (ref 8–23)
Calcium, Ion: 1.14 mmol/L — ABNORMAL LOW (ref 1.15–1.40)
Chloride: 98 mmol/L (ref 98–111)
Creatinine, Ser: 2.6 mg/dL — ABNORMAL HIGH (ref 0.61–1.24)
Glucose, Bld: 80 mg/dL (ref 70–99)
HCT: 26 % — ABNORMAL LOW (ref 39.0–52.0)
Hemoglobin: 8.8 g/dL — ABNORMAL LOW (ref 13.0–17.0)
Potassium: 4.7 mmol/L (ref 3.5–5.1)
Sodium: 130 mmol/L — ABNORMAL LOW (ref 135–145)
TCO2: 25 mmol/L (ref 22–32)

## 2020-03-15 LAB — RAPID URINE DRUG SCREEN, HOSP PERFORMED
Amphetamines: NOT DETECTED
Barbiturates: NOT DETECTED
Benzodiazepines: NOT DETECTED
Cocaine: NOT DETECTED
Opiates: NOT DETECTED
Tetrahydrocannabinol: NOT DETECTED

## 2020-03-15 LAB — TROPONIN I (HIGH SENSITIVITY): Troponin I (High Sensitivity): 7914 ng/L (ref <18)

## 2020-03-15 LAB — ACETAMINOPHEN LEVEL: Acetaminophen (Tylenol), Serum: <10 ug/mL — ABNORMAL LOW (ref 10–30)

## 2020-03-15 LAB — ETHANOL: Alcohol, Ethyl (B): <10 mg/dL (ref <10)

## 2020-03-15 LAB — SALICYLATE LEVEL: Salicylate Lvl: <7 mg/dL — ABNORMAL LOW (ref 7.0–30.0)

## 2020-03-15 MED ORDER — LORAZEPAM 2 MG/ML IJ SOLN
1.0000 mg | Freq: Once | INTRAMUSCULAR | Status: AC
  Administered 2020-03-15: 1 mg via INTRAVENOUS
  Filled 2020-03-15: qty 1

## 2020-03-15 MED ORDER — SODIUM CHLORIDE 0.9 % IV BOLUS
1000.0000 mL | Freq: Once | INTRAVENOUS | Status: AC
  Administered 2020-03-15: 1000 mL via INTRAVENOUS

## 2020-03-15 MED ORDER — NALOXONE HCL 0.4 MG/ML IJ SOLN
0.4000 mg | Freq: Once | INTRAMUSCULAR | Status: AC
  Administered 2020-03-15: 0.4 mg via INTRAVENOUS
  Filled 2020-03-15: qty 1

## 2020-03-15 MED ORDER — MORPHINE 100MG IN NS 100ML (1MG/ML) PREMIX INFUSION
1.0000 mg/h | INTRAVENOUS | Status: DC
  Administered 2020-03-15: 5 mg/h via INTRAVENOUS
  Filled 2020-03-15: qty 100

## 2020-03-15 NOTE — ED Provider Notes (Signed)
11:24 PM Care assumed from Dr. Darl Householder, patient with agitation and end-stage multiple sclerosis.  Family desires he, be comfort care only.  Initial evaluation has shown markedly elevated troponin, acute kidney injury, drop in hemoglobin.  He is being given medication for sedation and will need to be admitted for comfort care.  At this time, CT head and chest x-ray are pending.  CT head and chest x-ray showed no acute process.  Blood pressure has been relatively soft, but, since he is comfort care, sedation was continued.  Case is discussed with Dr. Alcario Drought of [facility] hospitalist, who agrees to admit the patient.  Results for orders placed or performed during the hospital encounter of 02/26/2020  SARS Coronavirus 2 by RT PCR (hospital order, performed in [place] hospital lab) Nasopharyngeal Nasopharyngeal Swab   Specimen: Nasopharyngeal Swab  Result Value Ref Range   SARS Coronavirus 2 NEGATIVE NEGATIVE  CBC with Differential/Platelet  Result Value Ref Range   WBC 13.6 (H) 4.0 - 10.5 K/uL   RBC 3.44 (L) 4.22 - 5.81 MIL/uL   Hemoglobin 11.9 (L) 13.0 - 17.0 g/dL   HCT 35.2 (L) 39.0 - 52.0 %   MCV 102.3 (H) 80.0 - 100.0 fL   MCH 34.6 (H) 26.0 - 34.0 pg   MCHC 33.8 30.0 - 36.0 g/dL   RDW 13.7 11.5 - 15.5 %   Platelets 284 150 - 400 K/uL   nRBC 0.0 0.0 - 0.2 %   Neutrophils Relative % 74 %   Neutro Abs 10.3 (H) 1.7 - 7.7 K/uL   Lymphocytes Relative 13 %   Lymphs Abs 1.7 0.7 - 4.0 K/uL   Monocytes Relative 8 %   Monocytes Absolute 1.0 0.1 - 1.0 K/uL   Eosinophils Relative 1 %   Eosinophils Absolute 0.1 0.0 - 0.5 K/uL   Basophils Relative 1 %   Basophils Absolute 0.1 0.0 - 0.1 K/uL   Immature Granulocytes 3 %   Abs Immature Granulocytes 0.40 (H) 0.00 - 0.07 K/uL  Comprehensive metabolic panel  Result Value Ref Range   Sodium 131 (L) 135 - 145 mmol/L   Potassium 4.7 3.5 - 5.1 mmol/L   Chloride 95 (L) 98 - 111 mmol/L   CO2 22 22 - 32 mmol/L   Glucose, Bld 91 70 - 99 mg/dL   BUN 40 (H) 8 -  23 mg/dL   Creatinine, Ser 2.66 (H) 0.61 - 1.24 mg/dL   Calcium 8.8 (L) 8.9 - 10.3 mg/dL   Total Protein 6.2 (L) 6.5 - 8.1 g/dL   Albumin 3.1 (L) 3.5 - 5.0 g/dL   AST 71 (H) 15 - 41 U/L   ALT 35 0 - 44 U/L   Alkaline Phosphatase 135 (H) 38 - 126 U/L   Total Bilirubin 0.4 0.3 - 1.2 mg/dL   GFR, Estimated 25 (L) >60 mL/min   Anion gap 14 5 - 15  Ethanol  Result Value Ref Range   Alcohol, Ethyl (B) <83 <41 mg/dL  Salicylate level  Result Value Ref Range   Salicylate Lvl <9.6 (L) 7.0 - 30.0 mg/dL  Acetaminophen level  Result Value Ref Range   Acetaminophen (Tylenol), Serum <10 (L) 10 - 30 ug/mL  Rapid urine drug screen (hospital performed)  Result Value Ref Range   Opiates NONE DETECTED NONE DETECTED   Cocaine NONE DETECTED NONE DETECTED   Benzodiazepines NONE DETECTED NONE DETECTED   Amphetamines NONE DETECTED NONE DETECTED   Tetrahydrocannabinol NONE DETECTED NONE DETECTED   Barbiturates NONE DETECTED NONE  DETECTED  Blood gas, venous  Result Value Ref Range   FIO2 21.00    pH, Ven 7.279 7.250 - 7.430   pCO2, Ven 50.0 44.0 - 60.0 mmHg   pO2, Ven 20.5 (LL) 32.0 - 45.0 mmHg   Bicarbonate 22.7 20.0 - 28.0 mmol/L   Acid-base deficit 3.6 (H) 0.0 - 2.0 mmol/L   O2 Saturation 23.2 %   Patient temperature 98.6   Urinalysis, Routine w reflex microscopic  Result Value Ref Range   Color, Urine YELLOW YELLOW   APPearance HAZY (A) CLEAR   Specific Gravity, Urine 1.012 1.005 - 1.030   pH 5.0 5.0 - 8.0   Glucose, UA NEGATIVE NEGATIVE mg/dL   Hgb urine dipstick NEGATIVE NEGATIVE   Bilirubin Urine NEGATIVE NEGATIVE   Ketones, ur NEGATIVE NEGATIVE mg/dL   Protein, ur NEGATIVE NEGATIVE mg/dL   Nitrite NEGATIVE NEGATIVE   Leukocytes,Ua NEGATIVE NEGATIVE  I-stat chem 8, ED (not at [facility] or [facility])  Result Value Ref Range   Sodium 130 (L) 135 - 145 mmol/L   Potassium 4.7 3.5 - 5.1 mmol/L   Chloride 98 98 - 111 mmol/L   BUN 49 (H) 8 - 23 mg/dL   Creatinine, Ser 2.60 (H) 0.61 - 1.24 mg/dL    Glucose, Bld 80 70 - 99 mg/dL   Calcium, Ion 1.14 (L) 1.15 - 1.40 mmol/L   TCO2 25 22 - 32 mmol/L   Hemoglobin 8.8 (L) 13.0 - 17.0 g/dL   HCT 26.0 (L) 39.0 - 52.0 %  Troponin I (High Sensitivity)  Result Value Ref Range   Troponin I (High Sensitivity) 7,914 (HH) <18 ng/L   CT Head Wo Contrast  Result Date: 03/25/2020 CLINICAL DATA:  Altered mental status, bradycardia EXAM: CT HEAD WITHOUT CONTRAST TECHNIQUE: Contiguous axial images were obtained from the base of the skull through the vertex without intravenous contrast. COMPARISON:  MRI 03/08/2018 FINDINGS: Brain: Normal anatomic configuration. Parenchymal volume loss is commensurate with the patient's age. Mild periventricular white matter changes are present likely reflecting the sequela of small vessel ischemia. No abnormal intra or extra-axial mass lesion or fluid collection. No abnormal mass effect or midline shift. No evidence of acute intracranial hemorrhage or infarct. Ventricular size is normal. Cerebellum unremarkable. Vascular: No asymmetric hyperdense vasculature at the skull base. Skull: Intact Sinuses/Orbits: Paranasal sinuses are clear. Orbits are unremarkable. Other: Mastoid air cells and middle ear cavities are clear. IMPRESSION: No evidence of acute intracranial hemorrhage or infarct. Mild senescent change. Electronically Signed   By: Fidela Salisbury MD   On: 03/18/2020 01:04   DG Chest Port 1 View  Result Date: Mar 24, 2020 CLINICAL DATA:  Dyspnea EXAM: PORTABLE CHEST 1 VIEW COMPARISON:  10/28/2018 FINDINGS: Lungs are clear. No pneumothorax or pleural effusion. Apparent cardiomegaly and superior mediastinal widening likely relates to semi-erect positioning and decreased pulmonary insufflation when compared to prior examination. Cardiac size is likely within normal limits. Pulmonary vascularity is normal when accounting for decreased pulmonary insufflation. Remote left clavicle fracture again noted. No acute bone abnormality.  IMPRESSION: No radiographic evidence of acute cardiopulmonary disease. Electronically Signed   By: Fidela Salisbury MD   On: 26/94/8546 27:03      Delora Fuel, MD 50/09/38 [phone]

## 2020-03-15 NOTE — ED Provider Notes (Signed)
[facility] DEPT Provider Note   CSN: [id number] Arrival date & time: 26-Mar-2020  2207     History No chief complaint on file.   Bear Osten is a 70 y.o. male hx of primary progressive MS, here presenting with worsening weakness and altered mental status and shortness of breath.  Patient has primary progressive multiple sclerosis.  Patient is now bedbound and has a Foley catheter and does not ambulate for the last year or so.  Over the last month or so, patient has been having paranoia and has been complaining of constant pain.  He also has been having facial pain for the last week or so.  He saw his dentist but there was no obvious dental source of his pain.  Patient was thought to have trigeminal neuralgia and was prescribed oxcarbazepine but the confusion got worse.  Patient has been given oxycodone by family.  He is also taking Motrin.  Initially, patient was thought to have an overdose by EMS.  But I called the wife who is adamant that he did not take more medicines and she gives medicines to him.  He also did not drink alcohol.  Patient is hallucinating and is talking about his heart rate and not making any sense.  Patient was given Narcan by EMS.  They were unable to establish access.  The history is provided by the spouse and the EMS personnel.  Level V caveat- AMS     Past Medical History:  Diagnosis Date  . Anxiety 12/22/2013  . Hearing loss   . Movement disorder   . Multiple sclerosis ([place])   . Neuromuscular disorder ([place])    MS  . 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 Vision abnormalities     Patient Active Problem List   Diagnosis Date Noted  . 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 High risk medication use 08/04/2019  . Gait disturbance 04/03/2019  . Chronic pain syndrome 04/03/2019  . Burning with urination 09/28/2018  . MS (multiple sclerosis) ([place]) 03/08/2018  . Weakness 03/08/2018  . Influenza due to  identified novel influenza A virus with other respiratory manifestations 03/08/2018  . Constipation 01/26/2017  . 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 Right sided sciatica 07/12/2015  . Left foot drop 07/12/2014  . Multiple sclerosis ([place]) 03/13/2014  . Spastic gait 03/13/2014  . Tobacco abuse counseling 03/13/2014  . Urinary urgency 03/13/2014  . Anxiety 12/22/2013  . Smoking 12/09/2013  . 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 BUNIONECTOMY         Family History  Problem Relation Age of Onset  . Dementia Mother   . Diabetes type II Father   . Hypertension Father   . Cancer Maternal Grandmother        pancreatic ca    Social History   Tobacco Use  . Smoking status: Current Every Day Smoker    Packs/day: 0.75    Years: 35.00    Pack years: 26.25    Types: Cigarettes  . Smokeless tobacco: Never Used  Substance Use Topics  . Alcohol use: Yes    Alcohol/week: 0.0 standard drinks    Comment: 3 to 6 oz. of [place] daily, and an occasional beer./fim  . Drug use: No    Home Medications Prior to Admission medications   Medication Sig Start Date End Date Taking? Authorizing Provider  atorvastatin (LIPITOR) 10 MG tablet Take 20 mg by mouth daily.  [provider]  baclofen (LIORESAL) 10 MG tablet Take two pills three times a day 01/29/20   Sater, Nanine Means, MD  Cholecalciferol (VITAMIN D3) 125 MCG (5000 UT) CAPS Take 5,000 Units by mouth in the morning and at bedtime.     [provider]  Cyanocobalamin (VITAMIN B-12) 5000 MCG TBDP Take 5,000 Units by mouth daily.    [provider]  dalfampridine 10 MG TB12 Take 1 tablet (10 mg total) by mouth every 12 (twelve) hours. 04/03/19   Sater, Nanine Means, MD  gabapentin (NEURONTIN) 300 MG capsule Take 300 mg by mouth every evening.    [provider]  lisinopril (ZESTRIL) 10 MG tablet Take 10 mg by mouth daily.    [provider]  modafinil (PROVIGIL) 200 MG tablet Take 1 tablet (200 mg total) by mouth daily. 01/29/20   Sater, Nanine Means, MD  Multiple Vitamins-Minerals (AIRBORNE PO) Take 2 Doses by mouth daily. 1 dose at 12pm and one in the evening    [provider]  Multiple Vitamins-Minerals (MULTIVITAMIN MEN PO) Take 1 tablet by mouth daily.    [provider]  Multiple Vitamins-Minerals (ZINC PO) Take 50 mg by mouth daily.    [provider]  ocrelizumab (OCREVUS) 300 MG/10ML injection Ocrevus 300mg  IV on day 1 and day 15. Ocrevus 600mg  IV every 6 months thereafter. 08/15/19   Sater, Nanine Means, MD  Oxcarbazepine (TRILEPTAL) 300 MG tablet Take 1 tablet in the morning and 2 tablets at night 03/08/20   Sater, Nanine Means, MD  oxyCODONE (OXY IR/ROXICODONE) 5 MG immediate release tablet Take 1 tablet every 6 hours as needed for severe pain. 03/05/20   Sater, Nanine Means, MD  QUERCETIN PO Take 1,000 mg by mouth daily.     [provider]  spironolactone (ALDACTONE) 100 MG tablet Take 100 mg by mouth daily.    [provider]  tamsulosin (FLOMAX) 0.4 MG CAPS capsule Take 1 capsule (0.4 mg total) by mouth daily. 01/29/20   Sater, Nanine Means, MD    Allergies    Patient has no known allergies.  Review of Systems   Review of Systems  Psychiatric/Behavioral: Positive for agitation and confusion.  All other systems reviewed and are negative.   Physical Exam Updated Vital Signs Pulse (!) 57   Temp 98.1 F (36.7 C) (Rectal)   Resp 20   Ht 5\' 10"  (1.778 m)   Wt 83.9 kg   SpO2 100%   BMI 26.54 kg/m   Physical Exam Vitals and nursing note reviewed.  Constitutional:      Comments: Confused and agitated  HENT:     Head: Normocephalic.     Nose: Nose normal.     Mouth/Throat:     Mouth: Mucous membranes are dry.  Eyes:     Extraocular Movements: Extraocular movements intact.     Pupils: Pupils are equal, round, and reactive to light.  Cardiovascular:     Rate and  Rhythm: Normal rate and regular rhythm.     Pulses: Normal pulses.     Comments: Intermittent bradycardia Pulmonary:     Effort: Pulmonary effort is normal.     Breath sounds: Normal breath sounds.  Abdominal:     General: Abdomen is flat.     Palpations: Abdomen is soft.  Genitourinary:    Comments: Foley catheter in place. Musculoskeletal:        General: Normal range of motion.     Cervical back: Normal range  of motion and neck supple.  Skin:    General: Skin is warm.     Capillary Refill: Capillary refill takes less than 2 seconds.  Neurological:     Comments: Very confused and agitated.  Patient is moving all extremities.  Patient is not following commands.  Patient is bedbound which is baseline  Psychiatric:     Comments: Agitated     ED Results / Procedures / Treatments   Labs (all labs ordered are listed, but only abnormal results are displayed) Labs Reviewed  CBC WITH DIFFERENTIAL/PLATELET - Abnormal; Notable for the following components:      Result Value   WBC 13.6 (*)    RBC 3.44 (*)    Hemoglobin 11.9 (*)    HCT 35.2 (*)    MCV 102.3 (*)    MCH 34.6 (*)    Neutro Abs 10.3 (*)    Abs Immature Granulocytes 0.40 (*)    All other components within normal limits  SARS CORONAVIRUS 2 BY RT PCR (HOSPITAL ORDER, [facility])  COMPREHENSIVE METABOLIC PANEL  ETHANOL  SALICYLATE LEVEL  ACETAMINOPHEN LEVEL  RAPID URINE DRUG SCREEN, HOSP PERFORMED  BLOOD GAS, VENOUS  I-STAT CHEM 8, ED  TROPONIN I (HIGH SENSITIVITY)    EKG None  Radiology No results found.  Procedures Procedures (including critical care time)  CRITICAL CARE Performed by: Wandra Arthurs   Total critical care time: 30 minutes  Critical care time was exclusive of separately billable procedures and treating other patients.  Critical care was necessary to treat or prevent imminent or life-threatening deterioration.  Critical care was time spent personally by me on  the following activities: development of treatment plan with patient and/or surrogate as well as nursing, discussions with consultants, evaluation of patient's response to treatment, examination of patient, obtaining history from patient or surrogate, ordering and performing treatments and interventions, ordering and review of laboratory studies, ordering and review of radiographic studies, pulse oximetry and re-evaluation of patient's condition.  Angiocath insertion Performed by: Wandra Arthurs  Consent: Verbal consent obtained. Risks and benefits: risks, benefits and alternatives were discussed Time out: Immediately prior to procedure a "time out" was called to verify the correct patient, procedure, equipment, support staff and site/side marked as required.  Preparation: Patient was prepped and draped in the usual sterile fashion.  Vein Location: L antecube  Ultrasound Guided  Gauge: 20 long   Normal blood return and flush without difficulty Patient tolerance: Patient tolerated the procedure well with no immediate complications.     Medications Ordered in ED Medications  naloxone [facility]) injection 0.4 mg (0.4 mg Intravenous Given March 19, 2020 2235)  sodium chloride 0.9 % bolus 1,000 mL (1,000 mLs Intravenous New Bag/Given March 19, 2020 2236)    ED Course  I have reviewed the triage vital signs and the nursing notes.  Pertinent labs & imaging results that were available during my care of the patient were reviewed by me and considered in my medical decision making (see chart for details).    MDM Rules/Calculators/A&P                         Rameen Quinney is a 70 y.o. male here presenting with altered mental status and shortness of breath.  Patient is now on 4 L nasal cannula.  Patient is very agitated and confused.  He has been bedbound for years and now has worsening confusion.  I am concerned that this is  worsening of his primary progressive MS. as per the neurology note, this seems to be  very end-stage.  I had a frank discussion with wife about CODE STATUS.  She states that he would not want to die on the ventilator and he will not want to be intubated.  She wants to focus on comfort care.  At this point, we will get blood work and CT head to rule out a bleed.  We will also get COVID test and hydrate patient and start morphine drip.  Patient will likely need to see palliative care and be kept comfortable.   11:23 PM Labs showed elevated Trop, Cr 2.6. Hg dropped 2 points. Talked to Dr. Theda Sers from neurology. He states that this is poor prognosis and agrees with comfort care. Patient on morphine drip, ordered ativan. CT head and UA and COVID pending. Anticipate admission. Signed out to Dr. Roxanne Mins in the ED.    Final Clinical Impression(s) / ED Diagnoses Final diagnoses:  None    Rx / DC Orders ED Discharge Orders    None       Drenda Freeze, MD 03/18/2020 2324

## 2020-03-15 NOTE — ED Triage Notes (Signed)
Brought in by EMS with unknown possible overdose. Patient has history of MS, takes Oxycodone, admits to frequent heavy drinking. Bradycardic in the 30s-40s.

## 2020-03-16 ENCOUNTER — Encounter (HOSPITAL_COMMUNITY): Payer: Self-pay | Admitting: Internal Medicine

## 2020-03-16 ENCOUNTER — Emergency Department (HOSPITAL_COMMUNITY): Payer: Medicare HMO

## 2020-03-16 DIAGNOSIS — F419 Anxiety disorder, unspecified: Secondary | ICD-10-CM | POA: Diagnosis present

## 2020-03-16 DIAGNOSIS — R69 Illness, unspecified: Secondary | ICD-10-CM | POA: Diagnosis not present

## 2020-03-16 DIAGNOSIS — R579 Shock, unspecified: Secondary | ICD-10-CM | POA: Diagnosis present

## 2020-03-16 DIAGNOSIS — Z66 Do not resuscitate: Secondary | ICD-10-CM | POA: Diagnosis not present

## 2020-03-16 DIAGNOSIS — Z515 Encounter for palliative care: Secondary | ICD-10-CM

## 2020-03-16 DIAGNOSIS — N179 Acute kidney failure, unspecified: Secondary | ICD-10-CM | POA: Diagnosis present

## 2020-03-16 DIAGNOSIS — Z833 Family history of diabetes mellitus: Secondary | ICD-10-CM | POA: Diagnosis not present

## 2020-03-16 DIAGNOSIS — Z79899 Other long term (current) drug therapy: Secondary | ICD-10-CM | POA: Diagnosis not present

## 2020-03-16 DIAGNOSIS — I249 Acute ischemic heart disease, unspecified: Secondary | ICD-10-CM | POA: Diagnosis not present

## 2020-03-16 DIAGNOSIS — R4182 Altered mental status, unspecified: Secondary | ICD-10-CM

## 2020-03-16 DIAGNOSIS — H9190 Unspecified hearing loss, unspecified ear: Secondary | ICD-10-CM | POA: Diagnosis present

## 2020-03-16 DIAGNOSIS — R778 Other specified abnormalities of plasma proteins: Secondary | ICD-10-CM

## 2020-03-16 DIAGNOSIS — F22 Delusional disorders: Secondary | ICD-10-CM | POA: Diagnosis present

## 2020-03-16 DIAGNOSIS — Z20822 Contact with and (suspected) exposure to covid-19: Secondary | ICD-10-CM | POA: Diagnosis not present

## 2020-03-16 DIAGNOSIS — Z800 Family history of malignant neoplasm of digestive organs: Secondary | ICD-10-CM | POA: Diagnosis not present

## 2020-03-16 DIAGNOSIS — D751 Secondary polycythemia: Secondary | ICD-10-CM | POA: Diagnosis present

## 2020-03-16 DIAGNOSIS — F17210 Nicotine dependence, cigarettes, uncomplicated: Secondary | ICD-10-CM | POA: Diagnosis present

## 2020-03-16 DIAGNOSIS — G35 Multiple sclerosis: Secondary | ICD-10-CM | POA: Diagnosis not present

## 2020-03-16 DIAGNOSIS — G894 Chronic pain syndrome: Secondary | ICD-10-CM | POA: Diagnosis not present

## 2020-03-16 DIAGNOSIS — R571 Hypovolemic shock: Secondary | ICD-10-CM | POA: Diagnosis present

## 2020-03-16 DIAGNOSIS — Z7401 Bed confinement status: Secondary | ICD-10-CM | POA: Diagnosis not present

## 2020-03-16 DIAGNOSIS — G259 Extrapyramidal and movement disorder, unspecified: Secondary | ICD-10-CM | POA: Diagnosis not present

## 2020-03-16 DIAGNOSIS — R519 Headache, unspecified: Secondary | ICD-10-CM | POA: Diagnosis present

## 2020-03-16 DIAGNOSIS — Z8249 Family history of ischemic heart disease and other diseases of the circulatory system: Secondary | ICD-10-CM | POA: Diagnosis not present

## 2020-03-16 DIAGNOSIS — G041 Tropical spastic paraplegia: Secondary | ICD-10-CM | POA: Diagnosis not present

## 2020-03-16 LAB — SARS CORONAVIRUS 2 BY RT PCR (HOSPITAL ORDER, PERFORMED IN ~~LOC~~ HOSPITAL LAB): SARS Coronavirus 2: NEGATIVE

## 2020-03-16 MED ORDER — ONDANSETRON HCL 4 MG PO TABS: 4.0000 mg | ORAL_TABLET | Freq: Four times a day (QID) | ORAL | Status: DC | PRN

## 2020-03-16 MED ORDER — GLYCOPYRROLATE 0.2 MG/ML IJ SOLN
0.2000 mg | INTRAMUSCULAR | Status: DC | PRN
  Administered 2020-03-16 (×2): 0.2 mg via SUBCUTANEOUS
  Filled 2020-03-16 (×2): qty 1

## 2020-03-16 MED ORDER — HALOPERIDOL LACTATE 2 MG/ML PO CONC
0.5000 mg | ORAL | Status: DC | PRN
  Filled 2020-03-16: qty 0.3

## 2020-03-16 MED ORDER — ACETAMINOPHEN 325 MG PO TABS: 650.0000 mg | ORAL_TABLET | Freq: Four times a day (QID) | ORAL | Status: DC | PRN

## 2020-03-16 MED ORDER — SCOPOLAMINE 1 MG/3DAYS TD PT72
1.0000 | MEDICATED_PATCH | TRANSDERMAL | Status: DC
  Administered 2020-03-16: 1.5 mg via TRANSDERMAL
  Filled 2020-03-16: qty 1

## 2020-03-16 MED ORDER — ONDANSETRON HCL 4 MG/2ML IJ SOLN: 4.0000 mg | Freq: Four times a day (QID) | INTRAMUSCULAR | Status: DC | PRN

## 2020-03-16 MED ORDER — POLYVINYL ALCOHOL 1.4 % OP SOLN
1.0000 [drp] | Freq: Four times a day (QID) | OPHTHALMIC | Status: DC | PRN
  Filled 2020-03-16: qty 15

## 2020-03-16 MED ORDER — GLYCOPYRROLATE 0.2 MG/ML IJ SOLN: 0.2000 mg | INTRAMUSCULAR | Status: DC | PRN

## 2020-03-16 MED ORDER — HALOPERIDOL 0.5 MG PO TABS: 0.5000 mg | ORAL_TABLET | ORAL | Status: DC | PRN

## 2020-03-16 MED ORDER — HALOPERIDOL LACTATE 5 MG/ML IJ SOLN: 0.5000 mg | INTRAMUSCULAR | Status: DC | PRN

## 2020-03-16 MED ORDER — GLYCOPYRROLATE 1 MG PO TABS
1.0000 mg | ORAL_TABLET | ORAL | Status: DC | PRN
Start: 2020-03-16 — End: 2020-03-17
  Filled 2020-03-16: qty 1

## 2020-03-16 MED ORDER — ACETAMINOPHEN 650 MG RE SUPP: 650.0000 mg | Freq: Four times a day (QID) | RECTAL | Status: DC | PRN

## 2020-03-16 MED ORDER — BIOTENE DRY MOUTH MT LIQD: 15.0000 mL | OROMUCOSAL | Status: DC | PRN

## 2020-03-17 LAB — URINE CULTURE

## 2020-03-18 LAB — TROPONIN I (HIGH SENSITIVITY): Troponin I (High Sensitivity): 7196 ng/L (ref <18)

## 2020-03-26 NOTE — H&P (Signed)
History and Physical    Vincent Evans EXB:[id number] DOB: 12-18-1950 DOA: Mar 21, 2020  PCP: Britt Bottom, MD  Patient coming from: Home  I have personally briefly reviewed patient's old medical records in [facility]  Chief Complaint: AMS  HPI: Vincent Evans is a 70 y.o. male with medical history significant of primary progressive MS.  This has progressed to point where pt hasnt ambulated in past 1 year, he has been bed bound for the past 1 month with paranoid, constant pain.  This has progressed to altered mental status and confusion which has worsened.  Pt in to ED via EMS.   ED Course: Pt with AKI creat 2.6, Trop of nearly 8000. WBC 13k.  Pt hypotensive to the 80s.  At this point, after discussion with wife: goals of care should be patient comfort and comfort care only.  Pt started on morphine gtt by EDP, hospitalist asked to admit.    Review of Systems: Unable to perform due to AMS.  Past Medical History:  Diagnosis Date  . Anxiety 12/22/2013  . Hearing loss   . Movement disorder   . Multiple sclerosis ([place])   . Neuromuscular disorder ([place])    MS  . 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 Vision abnormalities     Past Surgical History:  Procedure Laterality Date  . BUNIONECTOMY       reports that he has been smoking cigarettes. He has a 26.25 pack-year smoking history. He has never used smokeless tobacco. He reports current alcohol use. He reports that he does not use drugs.  No Known Allergies  Family History  Problem Relation Age of Onset  . Dementia Mother   . Diabetes type II Father   . Hypertension Father   . Cancer Maternal Grandmother        pancreatic ca     Prior to Admission medications   Medication Sig Start Date End Date Taking? Authorizing Provider  aspirin EC 81 MG tablet Take 81 mg by mouth daily. Swallow whole.   Yes [provider]  atorvastatin (LIPITOR) 10 MG tablet Take 20 mg by mouth daily.    Yes [provider]  baclofen (LIORESAL) 10 MG tablet Take two pills three times a day 01/29/20  Yes Sater, Nanine Means, MD  Cholecalciferol (VITAMIN D3) 125 MCG (5000 UT) CAPS Take 5,000 Units by mouth in the morning and at bedtime.    Yes [provider]  Cyanocobalamin (VITAMIN B-12) 5000 MCG TBDP Take 5,000 Units by mouth daily.   Yes [provider]  dalfampridine 10 MG TB12 Take 1 tablet (10 mg total) by mouth every 12 (twelve) hours. 04/03/19  Yes Sater, Nanine Means, MD  doxycycline (VIBRAMYCIN) 100 MG capsule Take 1 capsule by mouth in the morning and at bedtime.   Yes [provider]  furosemide (LASIX) 20 MG tablet Take 1 tablet by mouth in the morning and at bedtime. 10/06/19  Yes [provider]  gabapentin (NEURONTIN) 300 MG capsule Take 300 mg by mouth every evening.   Yes [provider]  lisinopril (ZESTRIL) 10 MG tablet Take 10 mg by mouth daily.   Yes [provider]  modafinil (PROVIGIL) 200 MG tablet Take 1 tablet (200 mg total) by mouth daily. 01/29/20  Yes Sater, Nanine Means, MD  Multiple Vitamins-Minerals (AIRBORNE PO) Take 2 Doses by mouth daily. 1 dose at 12pm and one in the evening   Yes [provider]  Multiple Vitamins-Minerals (MULTIVITAMIN MEN PO) Take  1 tablet by mouth daily.   Yes [provider]  Multiple Vitamins-Minerals (ZINC PO) Take 50 mg by mouth daily.   Yes [provider]  oxyCODONE (OXY IR/ROXICODONE) 5 MG immediate release tablet Take 1 tablet every 6 hours as needed for severe pain. Patient taking differently: Take 5 mg by mouth every 4 (four) hours as needed for moderate pain or severe pain. Take 1 tablet every 5 and 1/2 hours 03/05/20  Yes Sater, Nanine Means, MD  QUERCETIN PO Take 1,000 mg by mouth daily.    Yes [provider]  spironolactone (ALDACTONE) 100 MG tablet Take 100 mg by mouth daily.   Yes [provider]  ocrelizumab (OCREVUS) 300 MG/10ML injection Ocrevus 300mg  IV on day  1 and day 15. Ocrevus 600mg  IV every 6 months thereafter. Patient not taking: No sig reported 08/15/19   Britt Bottom, MD  Oxcarbazepine (TRILEPTAL) 300 MG tablet Take 1 tablet in the morning and 2 tablets at night Patient not taking: No sig reported 03/08/20   Sater, Nanine Means, MD  tamsulosin (FLOMAX) 0.4 MG CAPS capsule Take 1 capsule (0.4 mg total) by mouth daily. Patient not taking: No sig reported 01/29/20   Britt Bottom, MD    Physical Exam: Vitals:   02/28/2020 0215 03/13/2020 0230 02/24/2020 0300 03/04/2020 0345  BP: (!) 81/52 (!) 79/47 (!) 82/51 (!) 84/57  Pulse: (!) 50 (!) 51 (!) 41   Resp: (!) 9 (!) 9 11 17   Temp:      TempSrc:      SpO2: 96% 98% 99%   Weight:      Height:        Constitutional: NAD, sedated on morphine gtt. Eyes: PERRL, lids and conjunctivae normal ENMT: Mucous membranes are moist. Posterior pharynx clear of any exudate or lesions.Normal dentition.  Neck: normal, supple, no masses, no thyromegaly Respiratory: clear to auscultation bilaterally, no wheezing, no crackles. Normal respiratory effort. No accessory muscle use.  Cardiovascular: Regular rate and rhythm, no murmurs / rubs / gallops. No extremity edema. 2+ pedal pulses. No carotid bruits.  Abdomen: no tenderness, no masses palpated. No hepatosplenomegaly. Bowel sounds positive.  Musculoskeletal: no clubbing / cyanosis. No joint deformity upper and lower extremities. Good ROM, no contractures. Normal muscle tone.  Skin: no rashes, lesions, ulcers. No induration Neurologic: Pt sedated Psychiatric: Pt sedated   Labs on Admission: I have personally reviewed following labs and imaging studies  CBC: Recent Labs  Lab Apr 01, 2020 2224 04-01-20 2257  WBC 13.6*  --   NEUTROABS 10.3*  --   HGB 11.9* 8.8*  HCT 35.2* 26.0*  MCV 102.3*  --   PLT 284  --    Basic Metabolic Panel: Recent Labs  Lab 2020-04-01 2224 Apr 01, 2020 2257  NA 131* 130*  K 4.7 4.7  CL 95* 98  CO2 22  --   GLUCOSE 91 80  BUN 40*  49*  CREATININE 2.66* 2.60*  CALCIUM 8.8*  --    GFR: Estimated Creatinine Clearance: 27.7 mL/min (A) (by C-G formula based on SCr of 2.6 mg/dL (H)). Liver Function Tests: Recent Labs  Lab April 01, 2020 2224  AST 71*  ALT 35  ALKPHOS 135*  BILITOT 0.4  PROT 6.2*  ALBUMIN 3.1*   No results for input(s): LIPASE, AMYLASE in the last 168 hours. No results for input(s): AMMONIA in the last 168 hours. Coagulation Profile: No results for input(s): INR, PROTIME in the last 168 hours. Cardiac Enzymes: No results for input(s): CKTOTAL,  CKMB, CKMBINDEX, TROPONINI in the last 168 hours. BNP (last 3 results) No results for input(s): PROBNP in the last 8760 hours. HbA1C: No results for input(s): HGBA1C in the last 72 hours. CBG: No results for input(s): GLUCAP in the last 168 hours. Lipid Profile: No results for input(s): CHOL, HDL, LDLCALC, TRIG, CHOLHDL, LDLDIRECT in the last 72 hours. Thyroid Function Tests: No results for input(s): TSH, T4TOTAL, FREET4, T3FREE, THYROIDAB in the last 72 hours. Anemia Panel: No results for input(s): VITAMINB12, FOLATE, FERRITIN, TIBC, IRON, RETICCTPCT in the last 72 hours. Urine analysis:    Component Value Date/Time   COLORURINE YELLOW 03/05/2020 2316   APPEARANCEUR HAZY (A) 03/07/2020 2316   LABSPEC 1.012 03/23/2020 2316   PHURINE 5.0 03/10/2020 2316   GLUCOSEU NEGATIVE 03/14/2020 2316   HGBUR NEGATIVE 03/03/2020 2316   BILIRUBINUR NEGATIVE 03/02/2020 2316   KETONESUR NEGATIVE 02/25/2020 2316   PROTEINUR NEGATIVE 02/25/2020 2316   NITRITE NEGATIVE 03/01/2020 2316   LEUKOCYTESUR NEGATIVE 03/11/2020 2316    Radiological Exams on Admission: CT Head Wo Contrast  Result Date: 03/25/20 CLINICAL DATA:  Altered mental status, bradycardia EXAM: CT HEAD WITHOUT CONTRAST TECHNIQUE: Contiguous axial images were obtained from the base of the skull through the vertex without intravenous contrast. COMPARISON:  MRI 03/08/2018 FINDINGS: Brain: Normal anatomic  configuration. Parenchymal volume loss is commensurate with the patient's age. Mild periventricular white matter changes are present likely reflecting the sequela of small vessel ischemia. No abnormal intra or extra-axial mass lesion or fluid collection. No abnormal mass effect or midline shift. No evidence of acute intracranial hemorrhage or infarct. Ventricular size is normal. Cerebellum unremarkable. Vascular: No asymmetric hyperdense vasculature at the skull base. Skull: Intact Sinuses/Orbits: Paranasal sinuses are clear. Orbits are unremarkable. Other: Mastoid air cells and middle ear cavities are clear. IMPRESSION: No evidence of acute intracranial hemorrhage or infarct. Mild senescent change. Electronically Signed   By: Fidela Salisbury MD   On: Mar 25, 2020 01:04   DG Chest Port 1 View  Result Date: 03/07/2020 CLINICAL DATA:  Dyspnea EXAM: PORTABLE CHEST 1 VIEW COMPARISON:  10/28/2018 FINDINGS: Lungs are clear. No pneumothorax or pleural effusion. Apparent cardiomegaly and superior mediastinal widening likely relates to semi-erect positioning and decreased pulmonary insufflation when compared to prior examination. Cardiac size is likely within normal limits. Pulmonary vascularity is normal when accounting for decreased pulmonary insufflation. Remote left clavicle fracture again noted. No acute bone abnormality. IMPRESSION: No radiographic evidence of acute cardiopulmonary disease. Electronically Signed   By: Fidela Salisbury MD   On: 03/20/2020 23:32    EKG: Independently reviewed.  Assessment/Plan Principal Problem:   Admission for end of life care Active Problems:   MS (multiple sclerosis) ([place])   Chronic pain syndrome   ACS (acute coronary syndrome) ([place])   Acute kidney failure ([place])   Shock ([place])    1. Admission for end of life care - 1. Looks like patient has end stage MS, multiple attempts to slow progression havent really helped per neurologists most recent note. 2. I called and  confirmed goals of care with patient's wife, she and husband had previously had discussions about what to do when near end of life: she confirms that he would want to be DNR/DNI and comfort measures only at this point.  She agrees with morphine gtt. 3. She would like to see him in AM if he can survive that long, but would rather he be comfortable as first priority. 4. As a result, will continue morphine GTT: titrate to  comfort 5. Will stop all needle sticks and lab draws 6. Will put in for pal care consult for AM for hospice placement. 2. Shock - 1. Pt seems to be in shock with hypotension and AKI.  Possibly ACS / cardiogenic shock with that trop elevation? 2. Regardless goal of care a this point is clearly patient comfort only. 3. Therefore will do morphine gtt, and not do further invasive work up to try and determine cause.  DVT prophylaxis: None - Comfort measures only Code Status: DNR/DNI Family Communication: Spoke with wife on phone Disposition Plan: IP hospice vs in hospital death Consults called: Epic consult placed to pal care Admission status: Admit to inpatient  Severity of Illness: The appropriate patient status for this patient is INPATIENT. Inpatient status is judged to be reasonable and necessary in order to provide the required intensity of service to ensure the patient's safety. The patient's presenting symptoms, physical exam findings, and initial radiographic and laboratory data in the context of their chronic comorbidities is felt to place them at high risk for further clinical deterioration. Furthermore, it is not anticipated that the patient will be medically stable for discharge from the hospital within 2 midnights of admission. The following factors support the patient status of inpatient.   IP status for end of life care.   * I certify that at the point of admission it is my clinical judgment that the patient will require inpatient hospital care spanning beyond 2  midnights from the point of admission due to high intensity of service, high risk for further deterioration and high frequency of surveillance required.*    Doreather Hoxworth M. DO [facility]  How to contact the [facility] Attending or Consulting provider [place] or covering provider during after hours [place], for this patient?  1. Check the care team in [facility] and look for a) attending/consulting TR[facility] provider listed and b) the [facility] team listed 2. Log into [email or web address]  Amion Physician Scheduling and messaging for groups and whole hospitals  On call and physician scheduling software for group practices, residents, hospitalists and other medical providers for call, clinic, rotation and shift schedules. On[employer] is a hospital-wide system for scheduling doctors and paging doctors on call. EasyPlot is for scientific plotting and data analysis.  [email or web address]  and use 's universal password to access. If you do not have the password, please contact the hospital operator.  3. Locate the [facility] provider you are looking for under [facility] and page to a number that you can be directly reached. 4. If you still have difficulty reaching the provider, please page the [facility] (Director on Call) for the Hospitalists listed on amion for assistance.  04/15/20, 4:40 AM

## 2020-03-26 NOTE — Discharge Summary (Signed)
Death Summary  Vincent Evans ASN:[id number] DOB: [birth year] DOA: Mar 16, 2020  PCP: Britt Bottom, MD   Admit date: 02/24/2020 Date of Death: 2020-03-17  Final Diagnoses:  Principal Problem:   Admission for end of life care Active Problems:   MS (multiple sclerosis) ([place])   Chronic pain syndrome   ACS (acute coronary syndrome) ([place])   Acute kidney failure ([place])   Shock ([place])     History of present illness:  Patient admitted this morning, see detailed H&P by Dr Alcario Drought 70 year old male with medical history of primary progressive MS, progressed to point where patient has not ambulated in past 1 year.  Has been bedbound for past 1 month with paranoia, constant pain.  Presented with altered mental status and confusion.  Dr. Alcario Drought discussed goals of care with patient's wife and it was decided that patient will be admitted for end-of-life care and comfort care only  Hospital Course:  Advanced multiple sclerosis-patient presented with end-stage MS, attempts to stop progression were not helpful as per neurologist.  Patient wife discussed with admitting physician and comfort care was decided.  Patient was started on morphine GTT for comfort.  Acute coronary syndrome-patient's troponin went up to 7196.  No further treatment was offered as patient was only comfort measures.  Patient expired on March 17, 2020 at [place]  Signed:  Oswald Hillock  [facility] 03/18/2020, 5:14 PM

## 2020-03-26 NOTE — Progress Notes (Addendum)
Shortly after shift change, Patient expired peacefully at [place] pm. He had no pulse, no respirations, fixed/dilated pupils, and was unresponsive. 2nd RN Carmin Richmond verified this death/ time of death with me.  [name] was notified and was very [employer] attorney.  NP on call, AC, and organ donation were also notified.  Patient care is being performed at this time and patient will be transferred shortly to the morgue. Family will see patient at the funeral home (per wife Jenny Reichmann).[name]

## 2020-03-26 NOTE — Progress Notes (Signed)
Subjective: Patient admitted this morning, see detailed H&P by Dr Alcario Drought 70 year old male with medical history of primary progressive MS, progressed to point where patient has not ambulated in past 1 year.  Has been bedbound for past 1 month with paranoia, constant pain.  Presented with altered mental status and confusion.  Dr. Alcario Drought discussed goals of care with patient's wife and it was decided that patient will be admitted for end-of-life care and comfort care only.   Vitals:   03-19-20 0345 03-19-2020 0451  BP: (!) 84/57 (!) 78/57  Pulse:  (!) 51  Resp: 17 18  Temp:  97.7 F (36.5 C)  SpO2:  100%      A/P  Advanced multiple sclerosis-patient presenting with end-stage MS, attends to stop progression have not really helped per neurologist note.  Patient wife discussed with Dr. Alcario Drought and comfort measures was decided.  Patient started on morphine gtt. for comfort. Shock-patient presented with hypovolemic shock likely from sepsis versus dehydration.  Patient's BUN/creatinine was 49/2.60.  At the goal of care was comfort only patient started on morphine gtt.  Prognosis-Hours to days, anticipate hospital death. Discussed with patient's wife at bedside   [facility] Pager- [phone]

## 2020-03-26 NOTE — Progress Notes (Signed)
Wasted 20 mg/ml of 100 mg/100 ml morphine gtt bag with 2nd RN Carmin Richmond. [name]

## 2020-03-26 DEATH — deceased

## 2020-04-17 IMAGING — MR MR HEAD W/O CM
6 of 17 series · 26 of 48 positions shown · IV contrast (agent unspecified)
Comparison: Brain MRI 03/25/2015, 10/07/2012. No prior cervical
spine MRI

CLINICAL DATA: 67-year-old male with multiple sclerosis, positive
for flu. Increased weakness and fall.

Studies without and with contrast were planned, but the examination
had to be discontinued prior to completion due to patient request,
and no contrast was administered.
EXAM:
MRI HEAD WITHOUT CONTRAST
MRI CERVICAL SPINE WITHOUT CONTRAST
TECHNIQUE: Multiplanar, multiecho pulse sequences of the brain and surrounding
structures, and cervical spine, to include the craniocervical
junction and cervicothoracic junction, were obtained without
intravenous contrast.

[Series 3: DWI · axial · 3.0mm · 1.09mm/px · z∈[-68,+83]mm · 5 of 104 slices shown (1 of 4)]
[im 1/104]
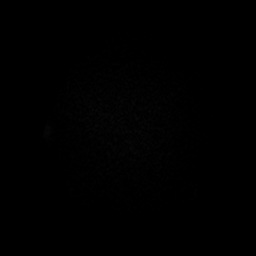
[im 26/104]
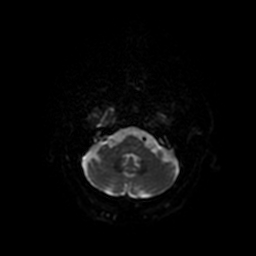
[im 52/104]
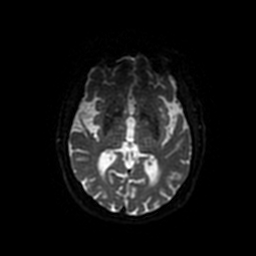
[im 78/104]
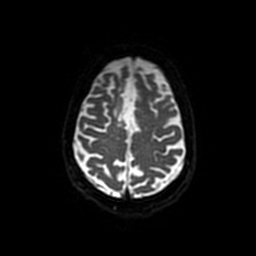
[im 104/104]
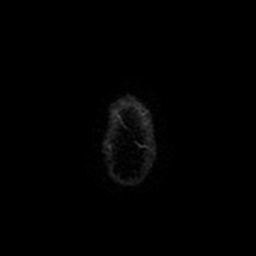

[Series 5: DWI · coronal · 4.0mm · 1.09mm/px · 4 of 72 slices shown (2 of 4)]
[im 1/72]
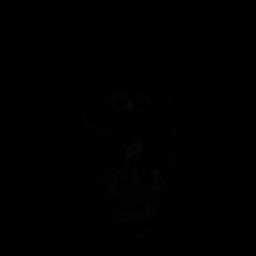
[im 24/72]
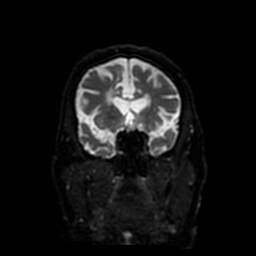
[im 48/72]
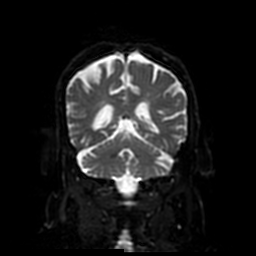
[im 72/72]
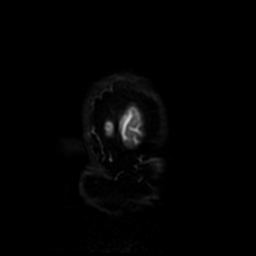

[Series 7: FLAIR · axial · 3.0mm · 0.43mm/px · z∈[-71,+96]mm · 2 of 29 slices shown (1 of 2)]
[im 1/29]
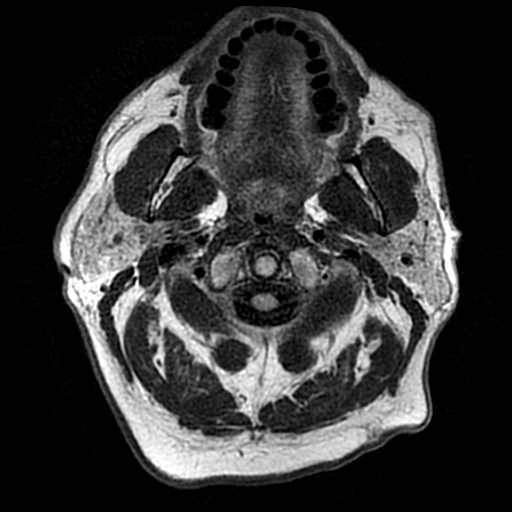
[im 29/29]
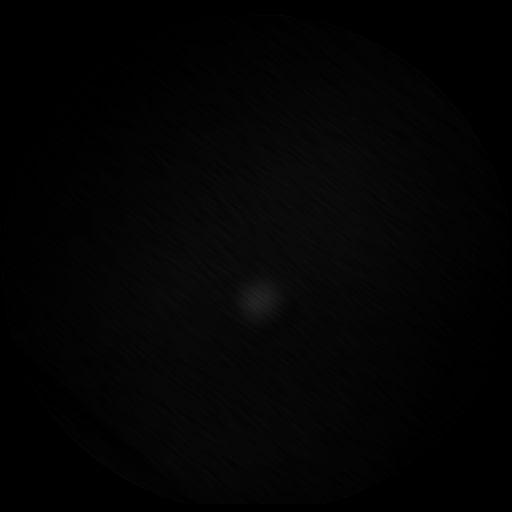

[Series 9: FLAIR · sagittal · 1.6mm · 0.47mm/px · 11 of 200 slices shown (2 of 2)]
[im 1/200]
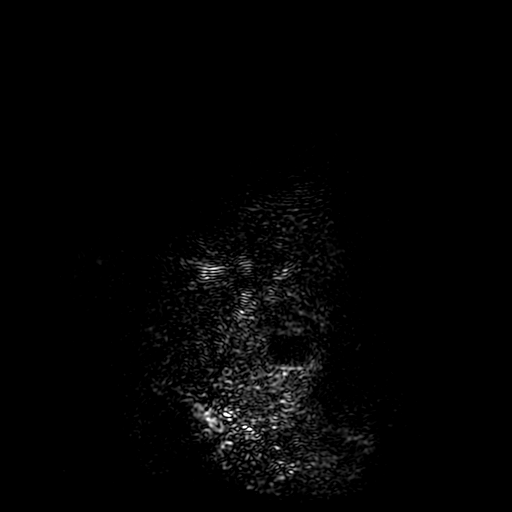
[im 20/200]
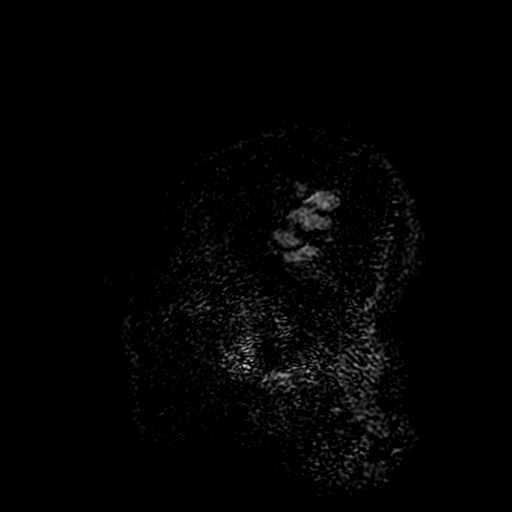
[im 40/200]
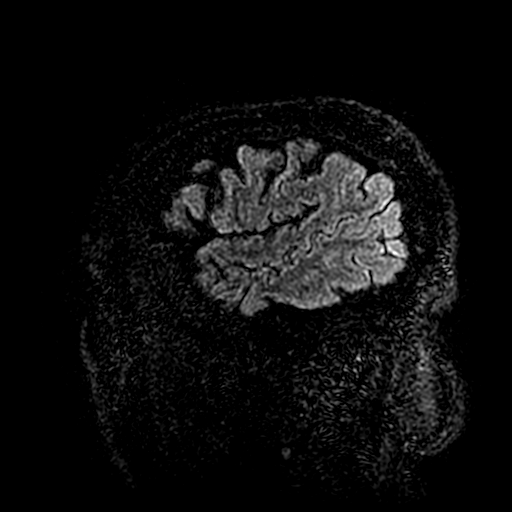
[im 60/200]
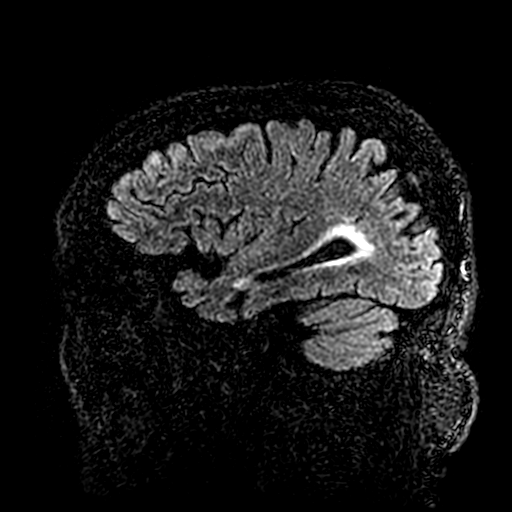
[im 80/200]
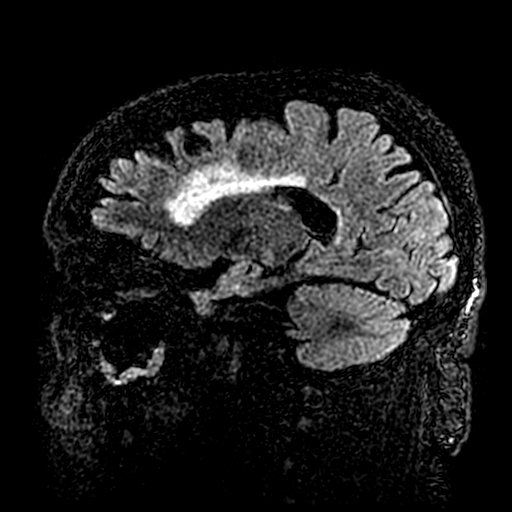
[im 100/200]
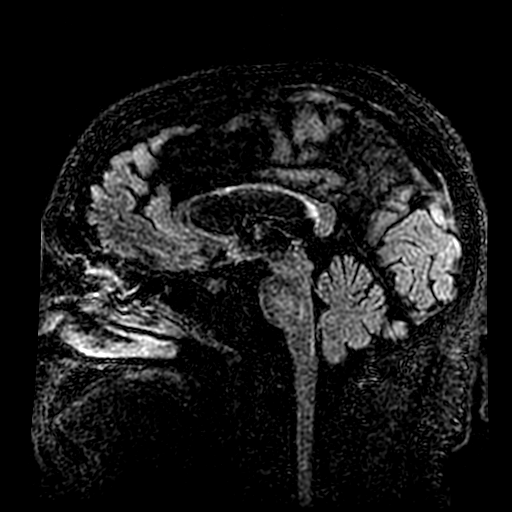
[im 120/200]
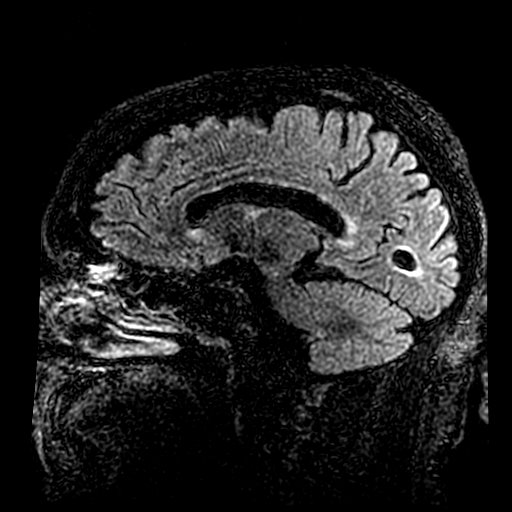
[im 140/200]
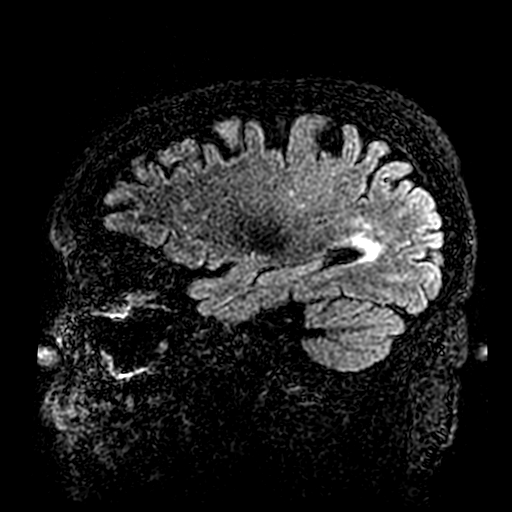
[im 160/200]
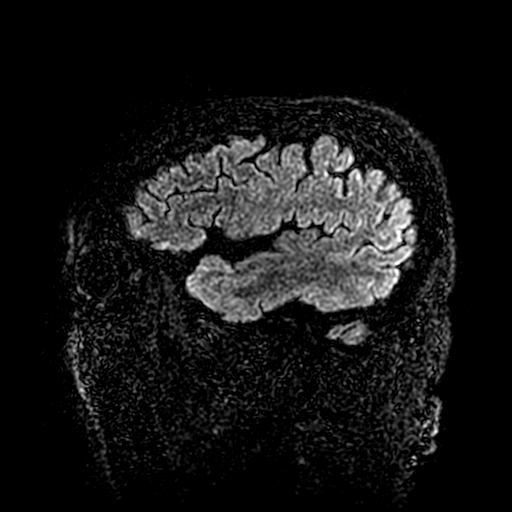
[im 180/200]
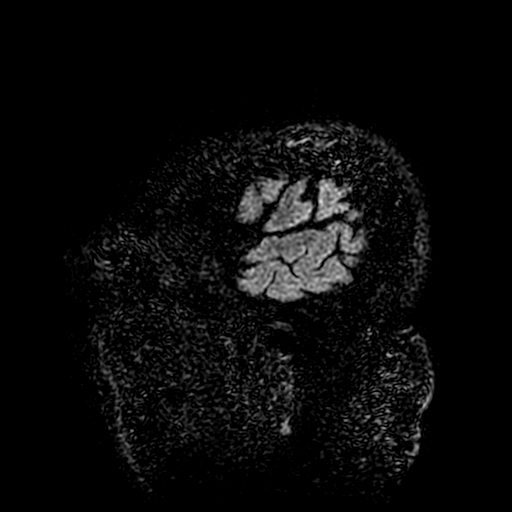
[im 200/200]
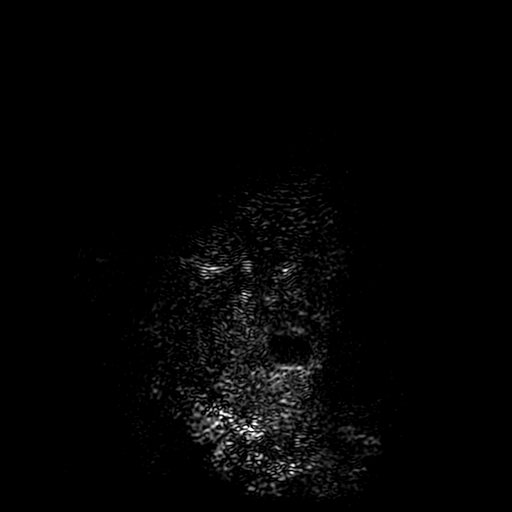

[Series 300: DWI · axial · 3.0mm · 1.09mm/px · z∈[-68,+83]mm · 3 of 52 slices shown (3 of 4)]
[im 1/52]
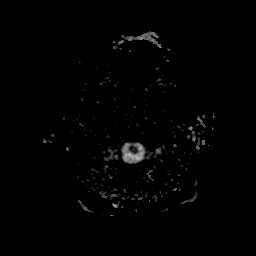
[im 26/52]
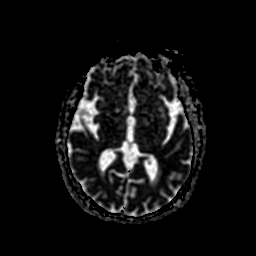
[im 52/52]
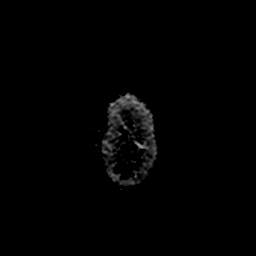

[Series 500: DWI · coronal · 4.0mm · 1.09mm/px · 1 of 36 slices shown (4 of 4)]
[im 1/36]
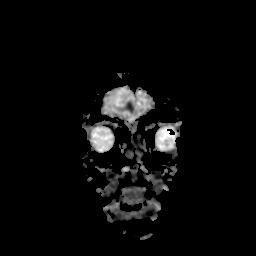

[26 of 48 positions shown; findings below may reference images not displayed]

FINDINGS: MRI HEAD FINDINGS

Brain: No restricted diffusion to suggest acute infarction. No
midline shift, mass effect, evidence of mass lesion,
ventriculomegaly, extra-axial collection or acute intracranial
hemorrhage. Cervicomedullary junction and pituitary are within
normal limits.

Chronic severe volume loss of the corpus callosum and anterior
frontal lobes. Underlying generalized cerebral volume loss. Chronic
cortical and white matter T2 and FLAIR hyperintensity along the
right cingulate and superior frontal gyri. Chronic confluent
periventricular white matter T2 and FLAIR hyperintensity with
occasional nodularity. The signal changes appear stable since 9370.
No new or restricted white matter lesion is identified. Superimposed
mild T2 heterogeneity in the deep gray matter nuclei, greater on the
left, is stable. But confluent T2 hyperintensity in the pons has
progressed (series 8, images 9 and 10). Subtle FLAIR hyperintensity
in the cerebellar peduncles best seen on sagittal FLAIR has not
significantly changed. The cerebellum is otherwise spared.

No chronic cerebral blood products. No other cortical
encephalomalacia identified.

Vascular: Major intracranial vascular flow voids are stable since
9370. The right vertebral artery is dominant.

Skull and upper cervical spine: Normal bone marrow signal.

Sinuses/Orbits: Orbits soft tissues appear stable and grossly
normal. Mild paranasal sinus mucosal thickening has not
significantly changed.

Other: Mastoids are clear. Visible internal auditory structures
appear normal. Scalp and face soft tissues appear negative.

MRI CERVICAL SPINE FINDINGS

Alignment: Mild straightening of cervical lordosis.

Vertebrae: No marrow edema or evidence of acute osseous abnormality.
Visualized bone marrow signal is within normal limits.

Cord: Multifocal abnormal cervical spinal cord signal compatible
with demyelinating disease. Dominant lesions are identified on
series 13, image 7 including the right ventral cord at C2, right
hemi cord at C3-C4, and also in the visible upper thoracic spine
left hemi cord at T1. No cord expansion, but rather somewhat
decreased cord volume. No contrast administered.

Posterior Fossa, vertebral arteries, paraspinal tissues:
Cervicomedullary junction is within normal limits.

Preserved major vascular flow voids in the neck. Negative neck soft
tissues.

Disc levels: Superimposed significant degenerative changes including

C3-C4: Circumferential disc bulge and endplate spurring affecting
the neural foramina with moderate bilateral C4 foraminal stenosis.

C4-C5: Right eccentric disc bulge and endplate spurring with
moderate right C5 foraminal stenosis.

C5-C6: Disc space loss with circumferential disc bulge and endplate
spurring eccentric to the left. Borderline to mild spinal stenosis.
Severe left and moderate right C6 foraminal stenosis.

C6-C7: Left eccentric disc bulge and endplate spurring. Moderate to
severe left C7 foraminal stenosis.
IMPRESSION: 1. The examination was discontinued prior to completion by patient
request, and no contrast was administered.
2. Progressed signal abnormality in the pons since 9370, but no
active demyelination in the brain identified by diffusion.
3. Otherwise stable intracranial demyelinating disease, including
pronounced involvement of the anterior right frontal lobe
4. Multifocal cervical and upper thoracic spinal cord demyelinating
lesions, age indeterminate but perhaps chronic on the basis of
associated spinal cord volume loss.
5. Superimposed cervical spine degeneration primarily resulting in
neural foraminal stenosis.

## 2020-05-30 ENCOUNTER — Ambulatory Visit: Payer: Medicare HMO | Admitting: Neurology
